# Patient Record
Sex: Male | Born: 1950 | Race: White | Hispanic: No | Smoking: Former smoker
Health system: Southern US, Community
[De-identification: ages and names within clinical notes are randomized; demographics above are authoritative.]

## PROBLEM LIST (undated history)

## (undated) DIAGNOSIS — I447 Left bundle-branch block, unspecified: Secondary | ICD-10-CM

## (undated) DIAGNOSIS — N402 Nodular prostate without lower urinary tract symptoms: Secondary | ICD-10-CM

## (undated) DIAGNOSIS — K759 Inflammatory liver disease, unspecified: Secondary | ICD-10-CM

## (undated) DIAGNOSIS — G4733 Obstructive sleep apnea (adult) (pediatric): Secondary | ICD-10-CM

## (undated) DIAGNOSIS — N400 Enlarged prostate without lower urinary tract symptoms: Secondary | ICD-10-CM

## (undated) DIAGNOSIS — I10 Essential (primary) hypertension: Secondary | ICD-10-CM

## (undated) DIAGNOSIS — M1990 Unspecified osteoarthritis, unspecified site: Secondary | ICD-10-CM

## (undated) DIAGNOSIS — K219 Gastro-esophageal reflux disease without esophagitis: Secondary | ICD-10-CM

## (undated) DIAGNOSIS — E119 Type 2 diabetes mellitus without complications: Secondary | ICD-10-CM

## (undated) DIAGNOSIS — E785 Hyperlipidemia, unspecified: Secondary | ICD-10-CM

## (undated) DIAGNOSIS — Z8719 Personal history of other diseases of the digestive system: Secondary | ICD-10-CM

## (undated) DIAGNOSIS — R972 Elevated prostate specific antigen [PSA]: Secondary | ICD-10-CM

## (undated) DIAGNOSIS — Z860100 Personal history of colon polyps, unspecified: Secondary | ICD-10-CM

## (undated) DIAGNOSIS — R399 Unspecified symptoms and signs involving the genitourinary system: Secondary | ICD-10-CM

## (undated) DIAGNOSIS — I2510 Atherosclerotic heart disease of native coronary artery without angina pectoris: Secondary | ICD-10-CM

## (undated) DIAGNOSIS — N529 Male erectile dysfunction, unspecified: Secondary | ICD-10-CM

## (undated) DIAGNOSIS — J42 Unspecified chronic bronchitis: Secondary | ICD-10-CM

## (undated) DIAGNOSIS — I739 Peripheral vascular disease, unspecified: Secondary | ICD-10-CM

## (undated) HISTORY — PX: EYE SURGERY: SHX253

## (undated) HISTORY — PX: CARDIAC CATHETERIZATION: SHX172

## (undated) HISTORY — PX: OTHER SURGICAL HISTORY: SHX169

## (undated) HISTORY — PX: COLONOSCOPY: SHX174

---

## 2000-08-02 ENCOUNTER — Ambulatory Visit (HOSPITAL_COMMUNITY): Admission: RE | Admit: 2000-08-02 | Discharge: 2000-08-02 | Payer: Self-pay | Admitting: Pulmonary Disease

## 2000-08-02 ENCOUNTER — Encounter: Payer: Self-pay | Admitting: Pulmonary Disease

## 2004-01-28 ENCOUNTER — Ambulatory Visit (HOSPITAL_BASED_OUTPATIENT_CLINIC_OR_DEPARTMENT_OTHER): Admission: RE | Admit: 2004-01-28 | Discharge: 2004-01-28 | Payer: Self-pay | Admitting: Pulmonary Disease

## 2004-11-09 ENCOUNTER — Ambulatory Visit: Payer: Self-pay | Admitting: Pulmonary Disease

## 2005-01-29 ENCOUNTER — Ambulatory Visit: Payer: Self-pay | Admitting: Pulmonary Disease

## 2005-08-13 ENCOUNTER — Ambulatory Visit: Payer: Self-pay | Admitting: Pulmonary Disease

## 2005-11-20 ENCOUNTER — Ambulatory Visit: Payer: Self-pay | Admitting: Pulmonary Disease

## 2005-11-23 ENCOUNTER — Ambulatory Visit: Payer: Self-pay | Admitting: Pulmonary Disease

## 2005-11-25 ENCOUNTER — Emergency Department (HOSPITAL_COMMUNITY): Admission: EM | Admit: 2005-11-25 | Discharge: 2005-11-25 | Payer: Self-pay | Admitting: Emergency Medicine

## 2005-11-26 ENCOUNTER — Encounter: Admission: RE | Admit: 2005-11-26 | Discharge: 2005-11-30 | Payer: Self-pay | Admitting: Pulmonary Disease

## 2005-12-14 ENCOUNTER — Ambulatory Visit: Payer: Self-pay | Admitting: Endocrinology

## 2006-01-18 ENCOUNTER — Ambulatory Visit: Payer: Self-pay | Admitting: Endocrinology

## 2006-03-18 ENCOUNTER — Ambulatory Visit: Payer: Self-pay | Admitting: Endocrinology

## 2006-03-18 LAB — CONVERTED CEMR LAB
ALT: 28 units/L (ref 0–40)
AST: 24 units/L (ref 0–37)
Albumin: 3.9 g/dL (ref 3.5–5.2)
Alkaline Phosphatase: 56 units/L (ref 39–117)
Bilirubin, Direct: 0.1 mg/dL (ref 0.0–0.3)
Creatinine,U: 69.7 mg/dL
Hgb A1c MFr Bld: 5.9 % (ref 4.6–6.0)
Microalb Creat Ratio: 5.7 mg/g (ref 0.0–30.0)
Microalb, Ur: 0.4 mg/dL (ref 0.0–1.9)
Total Bilirubin: 0.8 mg/dL (ref 0.3–1.2)
Total Protein: 6.7 g/dL (ref 6.0–8.3)

## 2006-03-22 ENCOUNTER — Ambulatory Visit: Payer: Self-pay | Admitting: Endocrinology

## 2006-05-24 ENCOUNTER — Ambulatory Visit: Payer: Self-pay | Admitting: Endocrinology

## 2006-05-24 LAB — CONVERTED CEMR LAB: Hgb A1c MFr Bld: 5.7 % (ref 4.6–6.0)

## 2006-06-07 ENCOUNTER — Ambulatory Visit: Payer: Self-pay | Admitting: Endocrinology

## 2006-08-27 ENCOUNTER — Ambulatory Visit: Payer: Self-pay | Admitting: Endocrinology

## 2006-08-27 LAB — CONVERTED CEMR LAB
Hgb A1c MFr Bld: 5.9 % (ref 4.6–6.0)
Vitamin B-12: 888 pg/mL (ref 211–911)

## 2006-09-06 ENCOUNTER — Ambulatory Visit: Payer: Self-pay | Admitting: Endocrinology

## 2006-12-02 ENCOUNTER — Ambulatory Visit: Payer: Self-pay | Admitting: Endocrinology

## 2007-01-24 ENCOUNTER — Encounter: Payer: Self-pay | Admitting: *Deleted

## 2007-01-24 DIAGNOSIS — M1990 Unspecified osteoarthritis, unspecified site: Secondary | ICD-10-CM | POA: Insufficient documentation

## 2007-01-24 DIAGNOSIS — F528 Other sexual dysfunction not due to a substance or known physiological condition: Secondary | ICD-10-CM

## 2007-03-10 ENCOUNTER — Ambulatory Visit: Payer: Self-pay | Admitting: Endocrinology

## 2007-03-10 DIAGNOSIS — E119 Type 2 diabetes mellitus without complications: Secondary | ICD-10-CM

## 2007-03-11 LAB — CONVERTED CEMR LAB: Hgb A1c MFr Bld: 6 % (ref 4.6–6.0)

## 2007-06-16 ENCOUNTER — Ambulatory Visit: Payer: Self-pay | Admitting: Endocrinology

## 2007-06-16 LAB — CONVERTED CEMR LAB: Hgb A1c MFr Bld: 6.3 % — ABNORMAL HIGH (ref 4.6–6.0)

## 2007-06-26 ENCOUNTER — Telehealth (INDEPENDENT_AMBULATORY_CARE_PROVIDER_SITE_OTHER): Payer: Self-pay | Admitting: *Deleted

## 2007-08-26 ENCOUNTER — Ambulatory Visit: Payer: Self-pay | Admitting: Endocrinology

## 2007-08-26 ENCOUNTER — Encounter (INDEPENDENT_AMBULATORY_CARE_PROVIDER_SITE_OTHER): Payer: Self-pay | Admitting: *Deleted

## 2007-08-26 DIAGNOSIS — E1159 Type 2 diabetes mellitus with other circulatory complications: Secondary | ICD-10-CM | POA: Insufficient documentation

## 2007-08-26 DIAGNOSIS — I10 Essential (primary) hypertension: Secondary | ICD-10-CM | POA: Insufficient documentation

## 2007-12-25 ENCOUNTER — Ambulatory Visit: Payer: Self-pay | Admitting: Endocrinology

## 2007-12-25 LAB — CONVERTED CEMR LAB: Hgb A1c MFr Bld: 6.7 % — ABNORMAL HIGH (ref 4.6–6.0)

## 2008-07-15 ENCOUNTER — Encounter (INDEPENDENT_AMBULATORY_CARE_PROVIDER_SITE_OTHER): Payer: Self-pay | Admitting: *Deleted

## 2009-01-21 ENCOUNTER — Ambulatory Visit: Payer: Self-pay | Admitting: Endocrinology

## 2009-01-25 LAB — CONVERTED CEMR LAB
BUN: 19 mg/dL (ref 6–23)
CO2: 30 meq/L (ref 19–32)
Calcium: 9 mg/dL (ref 8.4–10.5)
Chloride: 98 meq/L (ref 96–112)
Cholesterol: 160 mg/dL (ref 0–200)
Creatinine, Ser: 0.9 mg/dL (ref 0.4–1.5)
Creatinine,U: 59.2 mg/dL
Direct LDL: 93.2 mg/dL
GFR calc non Af Amer: 91.27 mL/min (ref >60)
Glucose, Bld: 217 mg/dL — ABNORMAL HIGH (ref 70–99)
HDL: 32.6 mg/dL — ABNORMAL LOW (ref >39.00)
Hgb A1c MFr Bld: 7.5 % — ABNORMAL HIGH (ref 4.6–6.5)
Microalb Creat Ratio: 5.1 mg/g (ref 0.0–30.0)
Microalb, Ur: 0.3 mg/dL (ref 0.0–1.9)
Potassium: 4.1 meq/L (ref 3.5–5.1)
Sodium: 136 meq/L (ref 135–145)
Total CHOL/HDL Ratio: 5
Triglycerides: 243 mg/dL — ABNORMAL HIGH (ref 0.0–149.0)
VLDL: 48.6 mg/dL — ABNORMAL HIGH (ref 0.0–40.0)

## 2009-02-03 ENCOUNTER — Ambulatory Visit: Payer: Self-pay | Admitting: Endocrinology

## 2009-02-03 DIAGNOSIS — E7800 Pure hypercholesterolemia, unspecified: Secondary | ICD-10-CM

## 2009-05-05 ENCOUNTER — Ambulatory Visit: Payer: Self-pay | Admitting: Endocrinology

## 2009-05-05 LAB — CONVERTED CEMR LAB
ALT: 37 units/L (ref 0–53)
AST: 27 units/L (ref 0–37)
Albumin: 3.9 g/dL (ref 3.5–5.2)
Alkaline Phosphatase: 54 units/L (ref 39–117)
Bilirubin, Direct: 0.1 mg/dL (ref 0.0–0.3)
Cholesterol: 148 mg/dL (ref 0–200)
Direct LDL: 82.7 mg/dL
HDL: 36.5 mg/dL — ABNORMAL LOW (ref >39.00)
Hgb A1c MFr Bld: 7.5 % — ABNORMAL HIGH (ref 4.6–6.5)
Total Bilirubin: 0.9 mg/dL (ref 0.3–1.2)
Total CHOL/HDL Ratio: 4
Total Protein: 7 g/dL (ref 6.0–8.3)
Triglycerides: 222 mg/dL — ABNORMAL HIGH (ref 0.0–149.0)
VLDL: 44.4 mg/dL — ABNORMAL HIGH (ref 0.0–40.0)

## 2009-06-14 ENCOUNTER — Telehealth: Payer: Self-pay | Admitting: Internal Medicine

## 2009-08-11 ENCOUNTER — Ambulatory Visit: Payer: Self-pay | Admitting: Endocrinology

## 2009-08-11 LAB — CONVERTED CEMR LAB: Hgb A1c MFr Bld: 7.6 % — ABNORMAL HIGH (ref 4.6–6.5)

## 2009-11-10 ENCOUNTER — Ambulatory Visit: Payer: Self-pay | Admitting: Endocrinology

## 2009-11-10 LAB — CONVERTED CEMR LAB: Hgb A1c MFr Bld: 6.6 % — ABNORMAL HIGH (ref 4.6–6.5)

## 2009-11-19 ENCOUNTER — Inpatient Hospital Stay (HOSPITAL_COMMUNITY): Admission: EM | Admit: 2009-11-19 | Discharge: 2009-11-22 | Payer: Self-pay | Admitting: Emergency Medicine

## 2009-11-20 ENCOUNTER — Encounter (INDEPENDENT_AMBULATORY_CARE_PROVIDER_SITE_OTHER): Payer: Self-pay | Admitting: Internal Medicine

## 2009-11-20 HISTORY — PX: TRANSTHORACIC ECHOCARDIOGRAM: SHX275

## 2009-11-21 ENCOUNTER — Telehealth: Payer: Self-pay | Admitting: Pulmonary Disease

## 2009-11-28 ENCOUNTER — Encounter: Payer: Self-pay | Admitting: Endocrinology

## 2009-12-14 ENCOUNTER — Encounter: Payer: Self-pay | Admitting: Endocrinology

## 2010-02-23 ENCOUNTER — Ambulatory Visit: Payer: Self-pay | Admitting: Endocrinology

## 2010-05-25 ENCOUNTER — Ambulatory Visit: Admit: 2010-05-25 | Payer: Self-pay | Admitting: Endocrinology

## 2010-06-01 NOTE — Progress Notes (Signed)
Summary: Schedule Colonoscopy  Phone Note Outgoing Call Call back at Home Phone [phone]   Call placed by: Harlow Mares CMA Duncan Dull),  June 14, 2009 9:22 AM Call placed to: Patient Summary of Call: spoke to patients wife she will let patient know that he is over due for his colonoscopy and have him call back to schedule his colonoscopy.  Initial call taken by: Harlow Mares CMA (AAMA),  June 14, 2009 9:23 AM

## 2010-06-01 NOTE — Assessment & Plan Note (Signed)
Summary: 6 MTH FU  #--STC-PER PT RS TO 3 MTH FU STC   Vital Signs:  Patient profile:   60 year old male Height:      70 inches (177.80 cm) Weight:      210.50 pounds (95.68 kg) O2 Sat:      96 % on Room air Temp:     97.4 degrees F (36.33 degrees C) oral Pulse rate:   81 / minute BP sitting:   120 / 58  (left arm) Cuff size:   large  Vitals Entered By: Josph Macho CMA (May 05, 2009 2:28 PM)  O2 Flow:  Room air CC: 6 month follow up/ CF   Primary Provider:  Dr. Kriste Basque  CC:  6 month follow up/ CF.  History of Present Illness: no cbg record, but states cbg's are well-controlled. he takes 2 lipid meds as rx'ed.  Current Medications (verified): 1)  Adult Aspirin Low Strength 81 Mg  Tbdp (Aspirin) .... Take 1 By Mouth Qd 2)  Actoplus Met 15-850 Mg  Tabs (Pioglitazone Hcl-Metformin Hcl) .... Bid 3)  Lisinopril-Hydrochlorothiazide 10-12.5 Mg Tabs (Lisinopril-Hydrochlorothiazide) .Marland Kitchen.. 1 Qd 4)  Pravastatin Sodium 20 Mg Tabs (Pravastatin Sodium) .Marland Kitchen.. 1 Qhs 5)  Fenofibrate 54 Mg Tabs (Fenofibrate) .Marland Kitchen.. 1 Qd  Allergies (verified): 1)  ! Codeine 2)  ! * Latex  Past History:  Past Medical History: ENCOUNTER FOR LONG-TERM USE OF OTHER MEDICATIONS (ICD-V58.69) HYPERCHOLESTEROLEMIA (ICD-272.0) HYPERTENSION (ICD-401.9) AODM (ICD-250.00) ERECTILE DYSFUNCTION (ICD-302.72) OSTEOARTHRITIS (ICD-715.90)  Review of Systems  The patient denies weight loss and weight gain.         he has a slight cough, but says it does not bother him  Physical Exam  General:  obese.  no distress  Neck:  Supple without thyroid enlargement or tenderness. No cervical lymphadenopathy Lungs:  Clear to auscultation bilaterally. Normal respiratory effort.  Additional Exam:  Hemoglobin A1C       [H]  7.5 %   Cholesterol               148 mg/dL                   1-610 Triglycerides        [H]  222.0 mg/dL                 9.6-045.4 HDL                  [L]  09.81 mg/dL Cholesterol LDL      19.1  mg/dL   Impression & Recommendations:  Problem # 1:  AODM (ICD-250.00) needs increased rx  Problem # 2:  HYPERCHOLESTEROLEMIA (ICD-272.0) Assessment: Improved  Problem # 3:  HYPERTENSION (ICD-401.9) the acei may be causing cough, but pt likes the fact that it is inexpensive.  Other Orders: TLB-A1C / Hgb A1C (Glycohemoglobin) (83036-A1C) TLB-Lipid Panel (80061-LIPID) TLB-Hepatic/Liver Function Pnl (80076-HEPATIC) Est. Patient Level III (47829)  Patient Instructions: 1)  tests are being ordered for you today.  a few days after the test(s), please call [phone] to hear your test results. 2)  pending the test results, please continue the same medications for now. 3)  Please schedule a follow-up appointment in 3 months. 4)  (update: i left message on phone-tree:  we could add [place] or onglyza, but these are expensive.)

## 2010-06-01 NOTE — Progress Notes (Signed)
Summary: FYI  Phone Note Call from Patient Call back at Work Phone [phone]   Caller: Spouse//barbara Call For: Jameah Rouser Reason for Call: Talk to Nurse Summary of Call: FYI: Pt in [place] rm 2041 since 7/23, re: heart attack. Initial call taken by: Darletta Moll,  November 21, 2009 3:12 PM  Follow-up for Phone Call        SN is aware of message. Randell Loop CMA  November 28, 2009 11:47 AM      Appended Document: FYI per SN---last seen SN 10/2005----pt seen by Dr. Aletta Edouard forward this to Dr. Everardo All as Lorain Childes  Appended Document: FYI noted, thank you

## 2010-06-01 NOTE — Assessment & Plan Note (Signed)
Summary: 3 MTH FU---STC   Vital Signs:  Patient profile:   60 year old male Height:      70 inches (177.80 cm) Weight:      203.25 pounds (92.39 kg) BMI:     29.27 O2 Sat:      94 % on Room air Temp:     98.3 degrees F (36.83 degrees C) oral Pulse rate:   71 / minute BP sitting:   120 / 72  (left arm) Cuff size:   regular  Vitals Entered By: Brenton Grills MA (February 23, 2010 4:01 PM)  O2 Flow:  Room air CC: 3 month F/U/pt is taking Januvia in place of Onglyza/aj Is Patient Diabetic? Yes   Primary Provider:  Dr. Kriste Basque  CC:  3 month F/U/pt is taking Januvia in place of Onglyza/aj.  History of Present Illness: pt states he feels well in general.  no cbg record, but states cbg's are well-controlled  Current Medications (verified): 1)  Adult Aspirin Low Strength 81 Mg  Tbdp (Aspirin) .... Take 1 By Mouth Qd 2)  Actoplus Met 15-850 Mg  Tabs (Pioglitazone Hcl-Metformin Hcl) .... Bid 3)  Lisinopril-Hydrochlorothiazide 10-12.5 Mg Tabs (Lisinopril-Hydrochlorothiazide) .Marland Kitchen.. 1 Qd 4)  Pravastatin Sodium 40 Mg Tabs (Pravastatin Sodium) .Marland Kitchen.. 1 By Mouth At Bedtime 5)  Fenofibrate 54 Mg Tabs (Fenofibrate) .Marland Kitchen.. 1 Qd 6)  Onglyza 5 Mg Tabs (Saxagliptin Hcl) .Marland Kitchen.. 1 Qam 7)  Omeprazole 20 Mg Tbec (Omeprazole) .Marland Kitchen.. 1 Tablet By Mouth Once Daily 8)  Multivitamins  Tabs (Multiple Vitamin) .Marland Kitchen.. 1 By Mouth Once Daily 9)  Metoprolol Succinate 25 Mg Xr24h-Tab (Metoprolol Succinate) .Marland Kitchen.. 1 By Mouth Once Daily 10)  Isosorbide Mononitrate Cr 30 Mg Xr24h-Tab (Isosorbide Mononitrate) .... 1/2 Daily 11)  Januvia 100 Mg Tabs (Sitagliptin Phosphate) .Marland Kitchen.. 1 By Mouth Once Daily  Allergies (verified): 1)  ! Codeine 2)  ! * Latex  Past History:  Past Medical History: Last updated: 05/05/2009 ENCOUNTER FOR LONG-TERM USE OF OTHER MEDICATIONS (ICD-V58.69) HYPERCHOLESTEROLEMIA (ICD-272.0) HYPERTENSION (ICD-401.9) AODM (ICD-250.00) ERECTILE DYSFUNCTION (ICD-302.72) OSTEOARTHRITIS (ICD-715.90)  Review of  Systems  The patient denies hypoglycemia.    Physical Exam  General:  obese.  no distress  Pulses:  dorsalis pedis intact bilat. Extremities:  no deformity.  no ulcer on the feet.  feet are of normal color and temp.  no edema  Neurologic:  sensation is intact to touch on the feet.  Additional Exam:   Hemoglobin A1C       [H]  7.1 %    Impression & Recommendations:  Problem # 1:  AODM (ICD-250.00) therapy limited by lack of health-insurance  Medications Added to Medication List This Visit: 1)  Pravastatin Sodium 40 Mg Tabs (Pravastatin sodium) .Marland Kitchen.. 1 by mouth at bedtime 2)  Omeprazole 20 Mg Tbec (Omeprazole) .Marland Kitchen.. 1 tablet by mouth once daily 3)  Multivitamins Tabs (Multiple vitamin) .Marland Kitchen.. 1 by mouth once daily 4)  Metoprolol Succinate 25 Mg Xr24h-tab (Metoprolol succinate) .Marland Kitchen.. 1 by mouth once daily 5)  Isosorbide Mononitrate Cr 30 Mg Xr24h-tab (Isosorbide mononitrate) .... 1/2 daily 6)  Januvia 100 Mg Tabs (Sitagliptin phosphate) .Marland Kitchen.. 1 by mouth once daily  Other Orders: Admin 1st Vaccine (87564) Flu Vaccine 5yrs + (33295) TLB-A1C / Hgb A1C (Glycohemoglobin) (83036-A1C) Est. Patient Level II (18841)  Patient Instructions: 1)  blood tests are being ordered for you today.  please call [phone] to hear your test results. 2)  pending the test results, please continue the same medications for now 3)  here are various samples of your diabetes meds.  the goal is to add up to onglyza 5 mg, metformin 2000 mg, and actos 45 mg, per day. 4)  (januvia 100 mg is like onglyza 5 mg). 5)  Please schedule a follow-up appointment in 3 months. Prescriptions: JANUVIA 100 MG TABS (SITAGLIPTIN PHOSPHATE) 1 by mouth once daily  #30 x 11   Entered and Authorized by:   Minus Breeding MD   Signed by:   Minus Breeding MD on 02/23/2010   Method used:   Print then Give to Patient   RxID:   [id number]    Orders Added: 1)  Admin 1st Vaccine [90471] 2)  Flu Vaccine 34yrs + [14782] 3)  TLB-A1C  / Hgb A1C (Glycohemoglobin) [83036-A1C] 4)  Est. Patient Level II [95621]     Preventive Care Screening  Last Pneumovax:    Date:  10/28/2009    Results:  given         Flu Vaccine Consent Questions     Do you have a history of severe allergic reactions to this vaccine? no    Any prior history of allergic reactions to egg and/or gelatin? no    Do you have a sensitivity to the preservative Thimersol? no    Do you have a past history of Guillan-Barre Syndrome? no    Do you currently have an acute febrile illness? no    Have you ever had a severe reaction to latex? no    Vaccine information given and explained to patient? yes    Are you currently pregnant? no    Lot Number:[id number]   Exp Date:10/28/2010   Site Given  Right Deltoid [id number]

## 2010-06-01 NOTE — Assessment & Plan Note (Signed)
Summary: 3 mos f/u / # / cd   Vital Signs:  Patient profile:   60 year old male Height:      70 inches (177.80 cm) Weight:      194 pounds (88.18 kg) BMI:     27.94 O2 Sat:      94 % on Room air Temp:     97.5 degrees F (36.39 degrees C) oral Pulse rate:   68 / minute BP sitting:   136 / 80  (left arm) Cuff size:   regular  Vitals Entered By: Brenton Grills MA (November 10, 2009 3:41 PM)  O2 Flow:  Room air CC: 3 mo F/U/aj    Primary Provider:  Dr. Kriste Basque  CC:  3 mo F/U/aj.  History of Present Illness: no cbg record, but states cbg's are well-controlled.   pt states he feels well in general.  he has lost weight, due to his efforts.  Current Medications (verified): 1)  Adult Aspirin Low Strength 81 Mg  Tbdp (Aspirin) .... Take 1 By Mouth Qd 2)  Actoplus Met 15-850 Mg  Tabs (Pioglitazone Hcl-Metformin Hcl) .... Bid 3)  Lisinopril-Hydrochlorothiazide 10-12.5 Mg Tabs (Lisinopril-Hydrochlorothiazide) .Marland Kitchen.. 1 Qd 4)  Pravastatin Sodium 20 Mg Tabs (Pravastatin Sodium) .Marland Kitchen.. 1 Qhs 5)  Fenofibrate 54 Mg Tabs (Fenofibrate) .Marland Kitchen.. 1 Qd 6)  Onglyza 5 Mg Tabs (Saxagliptin Hcl) .Marland Kitchen.. 1 Qam  Allergies (verified): 1)  ! Codeine 2)  ! 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    Physical Exam  General:  normal appearance.   Extremities:  no edema Additional Exam:  Hemoglobin A1C       [H]  6.6 %     Impression & Recommendations:  Problem # 1:  AODM (ICD-250.00) well-controlled  Other Orders: TLB-A1C / Hgb A1C (Glycohemoglobin) (83036-A1C) Est. Patient Level II (54098)  Patient  Instructions: 1)  tests are being ordered for you today.  a few days after the test(s), please call [phone] to hear your test results. 2)  pending the test results, please continue the same medications for now.   3)  here are some samples.  please note that onglyza 5 mg = januvia 100 mg = tradjenta 5 mg.   4)  Please schedule a follow-up appointment in 3 months. 5)  (update: i left message on phone-tree:  rx as we discussed)

## 2010-06-01 NOTE — Letter (Signed)
Summary: [facility]  [facility]   Imported By: Lennie Odor 12/09/2009 14:14:26  _____________________________________________________________________  External Attachment:    Type:   Image     Comment:   External Document

## 2010-06-01 NOTE — Assessment & Plan Note (Signed)
Summary: 3 MO ROV /NWS   Vital Signs:  Patient profile:   60 year old male Height:      70 inches (177.80 cm) Weight:      206.25 pounds (93.75 kg) O2 Sat:      95 % on Room air Temp:     97.2 degrees F (36.22 degrees C) oral Pulse rate:   81 / minute BP sitting:   132 / 68  (left arm) Cuff size:   large  Vitals Entered By: Josph Macho RMA (August 11, 2009 2:13 PM)  O2 Flow:  Room air CC: 3 month follow up/ CF   Primary Provider:  Dr. Kriste Basque  CC:  3 month follow up/ CF.  History of Present Illness: no cbg record, but states cbg's are high-100's.  pt states he feels well in general.  Current Medications (verified): 1)  Adult Aspirin Low Strength 81 Mg  Tbdp (Aspirin) .... Take 1 By Mouth Qd 2)  Actoplus Met 15-850 Mg  Tabs (Pioglitazone Hcl-Metformin Hcl) .... Bid 3)  Lisinopril-Hydrochlorothiazide 10-12.5 Mg Tabs (Lisinopril-Hydrochlorothiazide) .Marland Kitchen.. 1 Qd 4)  Pravastatin Sodium 20 Mg Tabs (Pravastatin Sodium) .Marland Kitchen.. 1 Qhs 5)  Fenofibrate 54 Mg Tabs (Fenofibrate) .Marland Kitchen.. 1 Qd  Allergies (verified): 1)  ! Codeine 2)  ! 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    Physical Exam  General:  normal appearance.   Pulses:  dorsalis pedis intact bilat. Extremities:  no deformity.  no ulcer on the feet.  feet are of normal color and temp.  no edema.  Neurologic:  sensation is intact to touch on the feet.  Additional Exam:  Hemoglobin A1C       [H]  7.6 %    Impression & Recommendations:  Problem # 1:  AODM (ICD-250.00) needs increased rx  Medications Added to Medication  List This Visit: 1)  Onglyza 5 Mg Tabs (Saxagliptin hcl) .Marland Kitchen.. 1 qam  Other Orders: TLB-A1C / Hgb A1C (Glycohemoglobin) (83036-A1C) Est. Patient Level II (78469)  Patient Instructions: 1)  tests are being ordered for you today.  a few days after the test(s), please call [phone] to hear your test results. 2)  pending the test results, please add onglyza 5 mg each am. 3)  Please schedule a follow-up appointment in 3 months. 4)  (update: i left message on phone-tree:  rx as we discussed) Prescriptions: ONGLYZA 5 MG TABS (SAXAGLIPTIN HCL) 1 qam  #30 x 11   Entered and Authorized by:   Minus Breeding MD   Signed by:   Minus Breeding MD on 08/11/2009   Method used:   Print then Give to Patient   RxID:   [id number] ONGLYZA 5 MG TABS (SAXAGLIPTIN HCL) 1 qam  #30 x 11   Entered and Authorized by:   Minus Breeding MD   Signed by:   Minus Breeding MD on 08/11/2009   Method used:   Electronically to        [facility]  [phone]* (retail)       [address]       [place], [place]  [address]  Ph: [phone] or [phone]       Fax: [phone]   RxID:   [id number]

## 2010-06-01 NOTE — Letter (Signed)
Summary: [facility]  [facility]   Imported By: Lester Pickering 12/20/2009 10:35:29  _____________________________________________________________________  External Attachment:    Type:   Image     Comment:   External Document

## 2010-06-09 ENCOUNTER — Other Ambulatory Visit: Payer: Self-pay | Admitting: Endocrinology

## 2010-06-09 ENCOUNTER — Ambulatory Visit (INDEPENDENT_AMBULATORY_CARE_PROVIDER_SITE_OTHER): Payer: Self-pay | Admitting: Endocrinology

## 2010-06-09 ENCOUNTER — Other Ambulatory Visit: Payer: Self-pay

## 2010-06-09 ENCOUNTER — Encounter (INDEPENDENT_AMBULATORY_CARE_PROVIDER_SITE_OTHER): Payer: Self-pay | Admitting: *Deleted

## 2010-06-09 ENCOUNTER — Encounter: Payer: Self-pay | Admitting: Endocrinology

## 2010-06-09 DIAGNOSIS — E7800 Pure hypercholesterolemia, unspecified: Secondary | ICD-10-CM

## 2010-06-09 DIAGNOSIS — E119 Type 2 diabetes mellitus without complications: Secondary | ICD-10-CM

## 2010-06-09 DIAGNOSIS — E785 Hyperlipidemia, unspecified: Secondary | ICD-10-CM

## 2010-06-09 DIAGNOSIS — Z79899 Other long term (current) drug therapy: Secondary | ICD-10-CM

## 2010-06-09 LAB — HEPATIC FUNCTION PANEL
Albumin: 4.4 g/dL (ref 3.5–5.2)
Total Protein: 7.4 g/dL (ref 6.0–8.3)

## 2010-06-09 LAB — LIPID PANEL
Cholesterol: 131 mg/dL (ref 0–200)
HDL: 37.5 mg/dL — ABNORMAL LOW (ref >39.00)
VLDL: 40.2 mg/dL — ABNORMAL HIGH (ref 0.0–40.0)

## 2010-06-09 LAB — BASIC METABOLIC PANEL
BUN: 17 mg/dL (ref 6–23)
Calcium: 9.5 mg/dL (ref 8.4–10.5)
GFR: 97.05 mL/min (ref >60.00)
Glucose, Bld: 127 mg/dL — ABNORMAL HIGH (ref 70–99)

## 2010-06-15 NOTE — Assessment & Plan Note (Signed)
Summary: PER WIFE/BARBARA RS FROM JAN--D/T--STC   Vital Signs:  Patient profile:   60 year old male Height:      70 inches (177.80 cm) Weight:      207.38 pounds (94.26 kg) BMI:     29.86 O2 Sat:      95 % on Room air Temp:     98.5 degrees F (36.94 degrees C) oral Pulse rate:   70 / minute Pulse rhythm:   regular BP sitting:   124 / 70  (left arm) Cuff size:   regular  Vitals Entered By: Brenton Grills CMA Duncan Dull) (June 09, 2010 11:28 AM)  O2 Flow:  Room air CC: 3 month F/U/pt c/o cough, sore throat x 2 days/aj Is Patient Diabetic? Yes   Primary Provider:  Dr. Kriste Basque  CC:  3 month F/U/pt c/o cough and sore throat x 2 days/aj.  History of Present Illness: pt states 2 days of moderate pain at the throat, and assoc nasal congestion. no cbg record, but states cbg's are well-controlled  Current Medications (verified): 1)  Adult Aspirin Low Strength 81 Mg  Tbdp (Aspirin) .... Take 1 By Mouth Qd 2)  Actoplus Met 15-850 Mg  Tabs (Pioglitazone Hcl-Metformin Hcl) .... Bid 3)  Lisinopril-Hydrochlorothiazide 10-12.5 Mg Tabs (Lisinopril-Hydrochlorothiazide) .Marland Kitchen.. 1 Qd 4)  Pravastatin Sodium 40 Mg Tabs (Pravastatin Sodium) .Marland Kitchen.. 1 By Mouth At Bedtime 5)  Fenofibrate 54 Mg Tabs (Fenofibrate) .Marland Kitchen.. 1 Qd 6)  Onglyza 5 Mg Tabs (Saxagliptin Hcl) .Marland Kitchen.. 1 Qam 7)  Omeprazole 20 Mg Tbec (Omeprazole) .Marland Kitchen.. 1 Tablet By Mouth Once Daily 8)  Multivitamins  Tabs (Multiple Vitamin) .Marland Kitchen.. 1 By Mouth Once Daily 9)  Metoprolol Succinate 25 Mg Xr24h-Tab (Metoprolol Succinate) .Marland Kitchen.. 1 By Mouth Once Daily 10)  Isosorbide Mononitrate Cr 30 Mg Xr24h-Tab (Isosorbide Mononitrate) .... 1/2 Daily  Allergies (verified): 1)  ! Codeine 2)  ! * Latex  Past History:  Past Medical History: Last updated: 05/05/2009 ENCOUNTER FOR LONG-TERM USE OF OTHER MEDICATIONS (ICD-V58.69) HYPERCHOLESTEROLEMIA (ICD-272.0) HYPERTENSION (ICD-401.9) AODM (ICD-250.00) ERECTILE DYSFUNCTION (ICD-302.72) OSTEOARTHRITIS  (ICD-715.90)  Review of Systems  The patient denies fever and dyspnea on exertion.         he has a dry cough.  Physical Exam  General:  obese.  no distress  Head:  head: no deformity eyes: no periorbital swelling, no proptosis external nose and ears are normal mouth: no lesion seen Ears:  both tm's are red (left > right) Lungs:  Clear to auscultation bilaterally. Normal respiratory effort.  Additional Exam:  Hemoglobin A1C       [H]  8.0 %   Cholesterol LDL        67.7 mg/dL   Impression & Recommendations:  Problem # 1:  uri new  Problem # 2:  AODM (ICD-250.00) needs increased rx  Problem # 3:  HYPERCHOLESTEROLEMIA (ICD-272.0) well-controlled  Medications Added to Medication List This Visit: 1)  Azithromycin 500 Mg Tabs (Azithromycin) .Marland Kitchen.. 1 tab once daily 2)  Glimepiride 1 Mg Tabs (Glimepiride) .... 1/2 tab each am  Other Orders: TLB-A1C / Hgb A1C (Glycohemoglobin) (83036-A1C) TLB-BMP (Basic Metabolic Panel-BMET) (80048-METABOL) TLB-Hepatic/Liver Function Pnl (80076-HEPATIC) TLB-Lipid Panel (80061-LIPID) Est. Patient Level III (16109)  Patient Instructions: 1)  blood tests are being ordered for you today.  please call [phone] to hear your test results. 2)  pending the test results, please continue the same medications for now 3)  here are various samples of your diabetes meds.  the goal is to add up  to onglyza 5 mg, metformin 2000 mg, and actos 45 mg, per day. 4)  (januvia 100 mg is like onglyza 5 mg). 5)  Please schedule a follow-up appointment in 3 months. 6)  take azithromycin 500 mg once daily. 7)  (update: i left message on phone-tree:  add amaryl 0.5 mg qam) Prescriptions: GLIMEPIRIDE 1 MG TABS (GLIMEPIRIDE) 1/2 tab each am  #30 x 5   Entered and Authorized by:   Minus Breeding MD   Signed by:   Minus Breeding MD on 06/10/2010   Method used:   Electronically to        [facility]  [phone]* (retail)       [address]        [place], [place]  [address]       Ph: [phone] or [phone]       Fax: [phone]   RxID:   [id number] AZITHROMYCIN 500 MG TABS (AZITHROMYCIN) 1 tab once daily  #6 x 0   Entered and Authorized by:   Minus Breeding MD   Signed by:   Minus Breeding MD on 06/09/2010   Method used:   Electronically to        [facility]  [phone]* (retail)       [address]       [place], [place]  [address]       Ph: [phone] or [phone]       Fax: [phone]   RxID:   [id number]    Orders Added: 1)  TLB-A1C / Hgb A1C (Glycohemoglobin) [83036-A1C] 2)  TLB-BMP (Basic Metabolic Panel-BMET) [80048-METABOL] 3)  TLB-Hepatic/Liver Function Pnl [80076-HEPATIC] 4)  TLB-Lipid Panel [80061-LIPID] 5)  Est. Patient Level III [46270]

## 2010-07-15 LAB — DIFFERENTIAL
Basophils Absolute: 0 10*3/uL (ref 0.0–0.1)
Basophils Relative: 0 % (ref 0–1)
Eosinophils Absolute: 0.1 10*3/uL (ref 0.0–0.7)
Eosinophils Relative: 1 % (ref 0–5)
Neutrophils Relative %: 68 % (ref 43–77)

## 2010-07-15 LAB — LIPID PANEL
Cholesterol: 130 mg/dL (ref 0–200)
HDL: 35 mg/dL — ABNORMAL LOW (ref >39)
Triglycerides: 171 mg/dL — ABNORMAL HIGH (ref <150)

## 2010-07-15 LAB — COMPREHENSIVE METABOLIC PANEL
ALT: 20 U/L (ref 0–53)
ALT: 25 U/L (ref 0–53)
AST: 21 U/L (ref 0–37)
AST: 27 U/L (ref 0–37)
Alkaline Phosphatase: 61 U/L (ref 39–117)
CO2: 22 mEq/L (ref 19–32)
CO2: 24 mEq/L (ref 19–32)
Chloride: 100 mEq/L (ref 96–112)
Chloride: 104 mEq/L (ref 96–112)
GFR calc Af Amer: 60 mL/min — ABNORMAL LOW (ref >60)
GFR calc Af Amer: >60 mL/min (ref >60)
GFR calc non Af Amer: 49 mL/min — ABNORMAL LOW (ref >60)
GFR calc non Af Amer: >60 mL/min (ref >60)
Glucose, Bld: 168 mg/dL — ABNORMAL HIGH (ref 70–99)
Sodium: 133 mEq/L — ABNORMAL LOW (ref 135–145)
Sodium: 138 mEq/L (ref 135–145)
Total Bilirubin: 0.6 mg/dL (ref 0.3–1.2)
Total Bilirubin: 0.6 mg/dL (ref 0.3–1.2)

## 2010-07-15 LAB — MRSA PCR SCREENING: MRSA by PCR: NEGATIVE

## 2010-07-15 LAB — TROPONIN I: Troponin I: 0.01 ng/mL (ref 0.00–0.06)

## 2010-07-15 LAB — CBC
HCT: 36.7 % — ABNORMAL LOW (ref 39.0–52.0)
HCT: 37.3 % — ABNORMAL LOW (ref 39.0–52.0)
HCT: 39 % (ref 39.0–52.0)
Hemoglobin: 12.3 g/dL — ABNORMAL LOW (ref 13.0–17.0)
Hemoglobin: 12.5 g/dL — ABNORMAL LOW (ref 13.0–17.0)
Hemoglobin: 13.1 g/dL (ref 13.0–17.0)
MCH: 30.2 pg (ref 26.0–34.0)
MCH: 30.5 pg (ref 26.0–34.0)
MCHC: 33.4 g/dL (ref 30.0–36.0)
MCV: 87.5 fL (ref 78.0–100.0)
Platelets: 291 10*3/uL (ref 150–400)
RBC: 4.15 MIL/uL — ABNORMAL LOW (ref 4.22–5.81)
RBC: 4.23 MIL/uL (ref 4.22–5.81)
RBC: 4.33 MIL/uL (ref 4.22–5.81)
RBC: 4.45 MIL/uL (ref 4.22–5.81)
WBC: 7.9 10*3/uL (ref 4.0–10.5)

## 2010-07-15 LAB — BASIC METABOLIC PANEL
CO2: 24 mEq/L (ref 19–32)
Calcium: 8.8 mg/dL (ref 8.4–10.5)
GFR calc Af Amer: >60 mL/min (ref >60)
GFR calc non Af Amer: >60 mL/min (ref >60)
GFR calc non Af Amer: >60 mL/min (ref >60)
Glucose, Bld: 122 mg/dL — ABNORMAL HIGH (ref 70–99)
Glucose, Bld: 135 mg/dL — ABNORMAL HIGH (ref 70–99)
Potassium: 4.4 mEq/L (ref 3.5–5.1)
Potassium: 4.4 mEq/L (ref 3.5–5.1)
Sodium: 136 mEq/L (ref 135–145)
Sodium: 141 mEq/L (ref 135–145)

## 2010-07-15 LAB — POCT I-STAT, CHEM 8
Calcium, Ion: 1.09 mmol/L — ABNORMAL LOW (ref 1.12–1.32)
Creatinine, Ser: 1.4 mg/dL (ref 0.4–1.5)
Glucose, Bld: 167 mg/dL — ABNORMAL HIGH (ref 70–99)
Hemoglobin: 14.3 g/dL (ref 13.0–17.0)
Potassium: 4 mEq/L (ref 3.5–5.1)
TCO2: 23 mmol/L (ref 0–100)

## 2010-07-15 LAB — GLUCOSE, CAPILLARY
Glucose-Capillary: 105 mg/dL — ABNORMAL HIGH (ref 70–99)
Glucose-Capillary: 112 mg/dL — ABNORMAL HIGH (ref 70–99)
Glucose-Capillary: 121 mg/dL — ABNORMAL HIGH (ref 70–99)
Glucose-Capillary: 137 mg/dL — ABNORMAL HIGH (ref 70–99)
Glucose-Capillary: 138 mg/dL — ABNORMAL HIGH (ref 70–99)
Glucose-Capillary: 144 mg/dL — ABNORMAL HIGH (ref 70–99)
Glucose-Capillary: 161 mg/dL — ABNORMAL HIGH (ref 70–99)
Glucose-Capillary: 170 mg/dL — ABNORMAL HIGH (ref 70–99)
Glucose-Capillary: 194 mg/dL — ABNORMAL HIGH (ref 70–99)

## 2010-07-15 LAB — POCT CARDIAC MARKERS: CKMB, poc: 1.2 ng/mL (ref 1.0–8.0)

## 2010-07-15 LAB — PROTIME-INR: Prothrombin Time: 13.3 seconds (ref 11.6–15.2)

## 2010-07-15 LAB — BRAIN NATRIURETIC PEPTIDE: Pro B Natriuretic peptide (BNP): <30 pg/mL (ref 0.0–100.0)

## 2010-07-15 LAB — CARDIAC PANEL(CRET KIN+CKTOT+MB+TROPI): Total CK: 98 U/L (ref 7–232)

## 2010-07-15 LAB — CK TOTAL AND CKMB (NOT AT ARMC)
CK, MB: 1.3 ng/mL (ref 0.3–4.0)
Relative Index: 1.2 (ref 0.0–2.5)

## 2010-08-24 ENCOUNTER — Other Ambulatory Visit: Payer: Self-pay | Admitting: Endocrinology

## 2010-09-07 ENCOUNTER — Ambulatory Visit: Payer: Self-pay | Admitting: Endocrinology

## 2010-09-07 DIAGNOSIS — Z0289 Encounter for other administrative examinations: Secondary | ICD-10-CM

## 2010-09-15 NOTE — Procedures (Signed)
Todd Krause, [name]   MEDICAL RECORD NO.:  [id number]          PATIENT TYPE:  OUT   LOCATION:  SLEEP CENTER                  FACILITY:  [facility]   PHYSICIAN:  Marcelyn Bruins, M.D. [facility] DATE OF BIRTH:  Dec 26, 1950   DATE OF STUDY:  01/28/2004                              NOCTURNAL POLYSOMNOGRAM   REFERRING PHYSICIAN:  Dr. Lonzo Cloud. Nadel.   INDICATION FOR SURGERY:  Hypersomnia with sleep apnea.   EPWORTH SLEEPINESS SCORE:  Epworth score is 17.   SLEEP ARCHITECTURE:  The patient had a total sleep time of 393 minutes with  a sleep efficiency of 84%.  There was decreased REM and very little slow-  wave sleep.  Sleep onset latency was normal, as was REM latency.   IMPRESSION:  1.  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  The patient had a respiratory disturbance      index of 14 events per hour.  The events occurred very commonly in rapid      eye movement, however, they were not entirely positional.  2.  Mild-to-moderate snoring noted throughout the study.  3.  Occasional premature ventricular contraction.      KC/MEDQ  D:  02/18/2004 17:27:40  T:  02/19/2004 09:14:17  Job:  [id number]

## 2010-09-15 NOTE — Consult Note (Signed)
[facility]                            ENDOCRINOLOGY CONSULTATION   NAME:Todd Krause, Todd Krause              MRN:          [id number]  DATE:12/14/2005                            DOB:          01-17-1951    REFERRING PHYSICIAN:  Lonzo Cloud. Kriste Basque, MD   REASON FOR REFERRAL:  Diabetes.   HISTORY OF PRESENT ILLNESS:  This is a 60 year old man diagnosed with  diabetes about a month ago.  He is not known to have any chronic  complications.  He was treated with Actos as well as Glucovance, and his  glucoses have improved from the 300s down to the low 100s.  Symptomatically,  he has 3 weeks of 24-pound weight loss with some associated blurry vision  and some numbness of the feet.  Each of these symptoms has improved over the  past few days.   PAST MEDICAL HISTORY:  1. Osteoarthritis.  2. ED.   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   FAMILY HISTORY:  Positive for diabetes in both parents.   REVIEW OF SYSTEMS:  Denies the following:  Fever, chest pain, nausea,  vomiting, or shortness of breath.   PHYSICAL EXAMINATION:  VITAL SIGNS:  Blood pressure 114/71.  Heart rate 57.  Temperature 97.5.  Weight 192.  GENERAL:  No distress.  SKIN:  Not diaphoretic.  No rash.  HEENT:  No ptosis.  No periorbital swelling.  Pharynx: No erythema.  NECK:  No goiter.  CHEST:  Clear to auscultation.  CARDIOVASCULAR:  No JVD.  No edema.  Regular rate and rhythm.  No murmur.  Pedal pulses are intact.  FEET:  Normal color and temperature.  I do not see any ulcer on the feet.  NEUROLOGIC:  Alert, well-orientation.  Sensation is intact to touch in the  feet, but slightly decreased from normal.   LABORATORY STUDIES:  On November 23, 2005, hemoglobin A1c 11.4.  GOT normal at  32.  GPT elevated at 53.   IMPRESSION:  1. Type 2 diabetes with significant improvement in his glycemic control      with medications prescribed by Dr. Kriste Basque.  2.  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  3. Slight numbness of the feet, probably due to his diabetes, improved.   PLAN:  1. We discussed the risk of diabetes and its natural history.  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  I also told      him that he has had so much improvement, that hypoglycemia could be a      problem.  Therefore, I have advised him to change his medications to      the following:  Actos plus Met 15/850, 2 every morning, 1 every      afternoon.  He will also take Januvia 100 mg a day.  2. He is advised to see an ophthalmologist.  3. Return in about three weeks.  4. When he  next has laboratory studies, will check his vitamin B12 and      recheck his hepatic transaminases.                                   Sean A. Everardo All, MD   SAE/MedQ  DD:  12/16/2005  DT:  12/16/2005  Job #:  [id number]   cc:   Lonzo Cloud. Kriste Basque, MD

## 2010-10-26 ENCOUNTER — Encounter: Payer: Self-pay | Admitting: *Deleted

## 2010-10-26 NOTE — Telephone Encounter (Signed)
A user error has taken place: encounter opened in error, closed for administrative reasons.

## 2011-01-31 ENCOUNTER — Other Ambulatory Visit: Payer: Self-pay | Admitting: Endocrinology

## 2011-03-02 ENCOUNTER — Other Ambulatory Visit: Payer: Self-pay | Admitting: Endocrinology

## 2011-03-08 ENCOUNTER — Other Ambulatory Visit: Payer: Self-pay | Admitting: Endocrinology

## 2011-03-13 ENCOUNTER — Ambulatory Visit (INDEPENDENT_AMBULATORY_CARE_PROVIDER_SITE_OTHER): Payer: Self-pay | Admitting: Endocrinology

## 2011-03-13 ENCOUNTER — Encounter: Payer: Self-pay | Admitting: Endocrinology

## 2011-03-13 DIAGNOSIS — E119 Type 2 diabetes mellitus without complications: Secondary | ICD-10-CM

## 2011-03-13 MED ORDER — GLIMEPIRIDE 4 MG PO TABS: 4.0000 mg | ORAL_TABLET | Freq: Two times a day (BID) | ORAL | Status: DC

## 2011-03-13 MED ORDER — METFORMIN HCL ER 500 MG PO TB24: ORAL_TABLET | ORAL | Status: DC

## 2011-03-13 MED ORDER — METOPROLOL SUCCINATE ER 25 MG PO TB24: 25.0000 mg | ORAL_TABLET | Freq: Every day | ORAL | Status: DC

## 2011-03-13 NOTE — Progress Notes (Signed)
  Subjective:    Patient ID: Todd Krause, male    DOB: 07-24-1950, 60 y.o.   MRN: [id number]  HPI Pt says cbg was well-controlled, until he ran out of actos + met.  he says it is too expensive.  no cbg record, but states cbg's are in the 200's.  pt states he feels well in general. He also ran out of bp meds.  Denies chest pain and sob. Past Medical History  Diagnosis Date  . HYPERCHOLESTEROLEMIA 02/03/2009  . 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 HYPERTENSION 08/26/2007  . OSTEOARTHRITIS 01/24/2007  . ERECTILE DYSFUNCTION 01/24/2007    No past surgical history on file.  History   Social History  . Marital Status: Married    Spouse Name: N/A    Number of Children: N/A  . Years of Education: N/A   Occupational History  . [employer] (cattle)    Social History Main Topics  . Smoking status: Former Games developer  . Smokeless tobacco: Not on file  . Alcohol Use: Not on file  . Drug Use: Not on file  . Sexually Active: Not on file   Other Topics Concern  . Not on file   Social History Narrative   No health insurance    Current Outpatient Prescriptions on File Prior to Visit  Medication Sig Dispense Refill  . aspirin 81 MG tablet Take 81 mg by mouth daily.        . fenofibrate 54 MG tablet TAKE ONE TABLET BY MOUTH EVERY DAY  30 tablet  4  . isosorbide mononitrate (IMDUR) 30 MG 24 hr tablet 1/2 tablet daily       . metoprolol succinate (TOPROL-XL) 25 MG 24 hr tablet Take 25 mg by mouth daily.        . Multiple Vitamin (MULTIVITAMIN) tablet Take 1 tablet by mouth daily.        Marland Kitchen omeprazole (PRILOSEC) 20 MG capsule Take 20 mg by mouth daily.        . pravastatin (PRAVACHOL) 40 MG tablet Take 40 mg by mouth at bedtime.        . saxagliptin HCl (ONGLYZA) 5 MG TABS tablet Take by mouth daily.        Marland Kitchen lisinopril-hydrochlorothiazide (PRINZIDE,ZESTORETIC) 10-12.5 MG per tablet TAKE ONE TABLET BY MOUTH EVERY DAY  30 tablet  5    Allergies  Allergen Reactions  . Codeine   . Latex     No family  history on file.  BP 152/82  Pulse 76  Temp(Src) 98.6 F (37 C) (Oral)  Ht 5\' 10"  (1.778 m)  Wt 209 lb (94.802 kg)  BMI 29.99 kg/m2  SpO2 93%  Review of Systems denies hypoglycemia    Objective:   Physical Exam VITAL SIGNS:  See vs page GENERAL: no distress Pulses: dorsalis pedis intact bilat.   Feet: no deformity.  no ulcer on the feet.  feet are of normal color and temp.  no edema Neuro: sensation is intact to touch on the feet     Assessment & Plan:  Htn, therapy limited by noncompliance.  i'll do the best i can. DM, therapy limited by noncompliance.  i'll do the best i can.

## 2011-03-13 NOTE — Patient Instructions (Addendum)
Stop actos, as the generic is still epensive. i have sent a prescription to your pharmacy, for the metformin. Increase glimepiride to 4 mg, 2x a day. check your blood sugar 1 time a day.  vary the time of day when you check, between before the 3 meals, and at bedtime.  also check if you have symptoms of your blood sugar being too high or too low.  please keep a record of the readings and bring it to your next appointment here.  please call us sooner if your blood sugar goes below 70, or if it stays over 200. I realize you do not have health insurance, but there are many other services you need for your health.  These include screening tests for colon, prostate and/or breast cancer; electrocardiogram, and others.  Please let me know if you want to get these important tests done.   Please come back for a follow-up appointment in 3 months.

## 2011-06-07 ENCOUNTER — Other Ambulatory Visit (INDEPENDENT_AMBULATORY_CARE_PROVIDER_SITE_OTHER): Payer: Self-pay

## 2011-06-07 ENCOUNTER — Encounter: Payer: Self-pay | Admitting: Endocrinology

## 2011-06-07 ENCOUNTER — Ambulatory Visit (INDEPENDENT_AMBULATORY_CARE_PROVIDER_SITE_OTHER): Payer: Self-pay | Admitting: Endocrinology

## 2011-06-07 VITALS — BP 110/68 | HR 79 | Temp 98.0°F | Ht 70.0 in | Wt 214.8 lb

## 2011-06-07 DIAGNOSIS — E119 Type 2 diabetes mellitus without complications: Secondary | ICD-10-CM

## 2011-06-07 LAB — HEMOGLOBIN A1C: Hgb A1c MFr Bld: 8.6 % — ABNORMAL HIGH (ref 4.6–6.5)

## 2011-06-07 NOTE — Progress Notes (Signed)
  Subjective:    Patient ID: Todd Krause, male    DOB: 11-09-1950, 61 y.o.   MRN: [id number]  HPI Pt returns for f/u of type 2 DM (2007).  pt states he feels well in general.  Despite being off the actos, cbg's are well-controlled Past Medical History  Diagnosis Date  . HYPERCHOLESTEROLEMIA 02/03/2009  . 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 HYPERTENSION 08/26/2007  . OSTEOARTHRITIS 01/24/2007  . ERECTILE DYSFUNCTION 01/24/2007    No past surgical history on file.  History   Social History  . Marital Status: Married    Spouse Name: N/A    Number of Children: N/A  . Years of Education: N/A   Occupational History  . [employer] (cattle)    Social History Main Topics  . Smoking status: Former Games developer  . Smokeless tobacco: Not on file  . Alcohol Use: Not on file  . Drug Use: Not on file  . Sexually Active: Not on file   Other Topics Concern  . Not on file   Social History Narrative   No health insurance    Current Outpatient Prescriptions on File Prior to Visit  Medication Sig Dispense Refill  . aspirin 81 MG tablet Take 81 mg by mouth daily.        . fenofibrate 54 MG tablet TAKE ONE TABLET BY MOUTH EVERY DAY  30 tablet  4  . glimepiride (AMARYL) 4 MG tablet Take 1 tablet (4 mg total) by mouth 2 (two) times daily.  60 tablet  11  . isosorbide mononitrate (IMDUR) 30 MG 24 hr tablet 1/2 tablet daily       . lisinopril-hydrochlorothiazide (PRINZIDE,ZESTORETIC) 10-12.5 MG per tablet TAKE ONE TABLET BY MOUTH EVERY DAY  30 tablet  5  . metFORMIN (GLUCOPHAGE-XR) 500 MG 24 hr tablet 4 tabs daily  120 tablet  11  . metoprolol succinate (TOPROL-XL) 25 MG 24 hr tablet Take 1 tablet (25 mg total) by mouth daily.  30 tablet  11  . Multiple Vitamin (MULTIVITAMIN) tablet Take 1 tablet by mouth daily.        Marland Kitchen omeprazole (PRILOSEC) 20 MG capsule Take 20 mg by mouth daily.        . pravastatin (PRAVACHOL) 40 MG tablet Take 40 mg by mouth at bedtime.        . saxagliptin HCl (ONGLYZA) 5 MG TABS tablet  Take by mouth daily.          Allergies  Allergen Reactions  . Codeine   . Latex     No family history on file.  BP 110/68  Pulse 79  Temp(Src) 98 F (36.7 C) (Oral)  Ht 5\' 10"  (1.778 m)  Wt 214 lb 12.8 oz (97.433 kg)  BMI 30.82 kg/m2  SpO2 94%   Review of Systems denies hypoglycemia    Objective:   Physical Exam VITAL SIGNS:  See vs page GENERAL: no distress Pulses: dorsalis pedis intact bilat.   Feet: no deformity.  no ulcer on the feet.  feet are of normal color and temp.  no edema Neuro: sensation is intact to touch on the feet   Lab Results  Component Value Date   HGBA1C 8.6* 06/07/2011      Assessment & Plan:  Type 2 DM, needs increased rx

## 2011-06-07 NOTE — Patient Instructions (Addendum)
blood tests are being requested for you today.  please call [phone] to hear your test results.  You will be prompted to enter the 9-digit "MRN" number that appears at the top left of this page, followed by #.  Then you will hear the message. check your blood sugar 1 time a day.  vary the time of day when you check, between before the 3 meals, and at bedtime.  also check if you have symptoms of your blood sugar being too high or too low.  please keep a record of the readings and bring it to your next appointment here.  please call us sooner if your blood sugar goes below 70, or if it stays over 200. Please come back for a follow-up appointment in 3 months.   (update: i left message on phone-tree:  acots will get cheaper soon.  Best oral option now is to add parlodel.  Cheapest overall is to change to insulin)

## 2011-08-30 ENCOUNTER — Other Ambulatory Visit: Payer: Self-pay | Admitting: Endocrinology

## 2011-09-06 ENCOUNTER — Other Ambulatory Visit (INDEPENDENT_AMBULATORY_CARE_PROVIDER_SITE_OTHER): Payer: Self-pay

## 2011-09-06 ENCOUNTER — Ambulatory Visit (INDEPENDENT_AMBULATORY_CARE_PROVIDER_SITE_OTHER): Payer: Self-pay | Admitting: Endocrinology

## 2011-09-06 ENCOUNTER — Encounter: Payer: Self-pay | Admitting: Endocrinology

## 2011-09-06 VITALS — BP 122/80 | HR 66 | Temp 98.1°F | Ht 70.0 in | Wt 210.0 lb

## 2011-09-06 DIAGNOSIS — E119 Type 2 diabetes mellitus without complications: Secondary | ICD-10-CM

## 2011-09-06 LAB — HEMOGLOBIN A1C: Hgb A1c MFr Bld: 9.9 % — ABNORMAL HIGH (ref 4.6–6.5)

## 2011-09-06 NOTE — Progress Notes (Signed)
  Subjective:    Patient ID: Todd Krause, male    DOB: 1950-08-23, 61 y.o.   MRN: [id number]  HPI Pt returns for f/u of type 2 DM (dx'ed 2007; no known complications).  pt states he feels well in general. He had to stop actos, due to cost, but it has since gotten cheaper.  no cbg record, but he states cbg's are well-controlled.   Past Medical History  Diagnosis Date  . HYPERCHOLESTEROLEMIA 02/03/2009  . 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 HYPERTENSION 08/26/2007  . OSTEOARTHRITIS 01/24/2007  . ERECTILE DYSFUNCTION 01/24/2007    No past surgical history on file.  History   Social History  . Marital Status: Married    Spouse Name: N/A    Number of Children: N/A  . Years of Education: N/A   Occupational History  . [employer] (cattle)    Social History Main Topics  . Smoking status: Former Games developer  . Smokeless tobacco: Not on file  . Alcohol Use: Not on file  . Drug Use: Not on file  . Sexually Active: Not on file   Other Topics Concern  . Not on file   Social History Narrative   No health insurance    Current Outpatient Prescriptions on File Prior to Visit  Medication Sig Dispense Refill  . aspirin 81 MG tablet Take 81 mg by mouth daily.        . fenofibrate 54 MG tablet TAKE ONE TABLET BY MOUTH EVERY DAY.  30 tablet  5  . glimepiride (AMARYL) 4 MG tablet Take 1 tablet (4 mg total) by mouth 2 (two) times daily.  60 tablet  11  . isosorbide mononitrate (IMDUR) 30 MG 24 hr tablet 1/2 tablet daily       . lisinopril-hydrochlorothiazide (PRINZIDE,ZESTORETIC) 10-12.5 MG per tablet TAKE ONE TABLET BY MOUTH EVERY DAY  30 tablet  5  . metFORMIN (GLUCOPHAGE-XR) 500 MG 24 hr tablet 4 tabs daily  120 tablet  11  . metoprolol succinate (TOPROL-XL) 25 MG 24 hr tablet Take 1 tablet (25 mg total) by mouth daily.  30 tablet  11  . Multiple Vitamin (MULTIVITAMIN) tablet Take 1 tablet by mouth daily.        Marland Kitchen omeprazole (PRILOSEC) 20 MG capsule Take 20 mg by mouth daily.        . pravastatin  (PRAVACHOL) 40 MG tablet Take 40 mg by mouth at bedtime.        . saxagliptin HCl (ONGLYZA) 5 MG TABS tablet Take by mouth daily.        . pioglitazone (ACTOS) 45 MG tablet Take 1 tablet (45 mg total) by mouth daily.  90 tablet  3    Allergies  Allergen Reactions  . Codeine   . Latex     No family history on file.  BP 122/80  Pulse 66  Temp(Src) 98.1 F (36.7 C) (Oral)  Ht 5\' 10"  (1.778 m)  Wt 210 lb (95.255 kg)  BMI 30.13 kg/m2  SpO2 96%  Review of Systems denies hypoglycemia    Objective:   Physical Exam VITAL SIGNS:  See vs page GENERAL: no distress NECK: There is no palpable thyroid enlargement.  No thyroid nodule is palpable.  No palpable lymphadenopathy at the anterior neck.    Lab Results  Component Value Date   HGBA1C 9.9* 09/06/2011       Assessment & Plan:  Type 2 DM, needs increased rx

## 2011-09-06 NOTE — Patient Instructions (Addendum)
blood tests are being requested for you today.  You will receive a letter with results. check your blood sugar 1 time a day.  vary the time of day when you check, between before the 3 meals, and at bedtime.  also check if you have symptoms of your blood sugar being too high or too low.  please keep a record of the readings and bring it to your next appointment here.  please call us sooner if your blood sugar goes below 70, or if it stays over 200. Please come back for a follow-up appointment in 3 months.   good diet and exercise habits significanly improve the control of your diabetes.  please let me know if you wish to be referred to a dietician.  high blood sugar is very risky to your health.  you should see an eye doctor every year. controlling your blood pressure and cholesterol drastically reduces the damage diabetes does to your body.  this also applies to quitting smoking.  please discuss these with your doctor.  you should take an aspirin every day, unless you have been advised by a doctor not to. (actos [employer])

## 2011-09-07 ENCOUNTER — Encounter: Payer: Self-pay | Admitting: Endocrinology

## 2011-09-07 ENCOUNTER — Telehealth: Payer: Self-pay | Admitting: *Deleted

## 2011-09-07 MED ORDER — PIOGLITAZONE HCL 45 MG PO TABS: 45.0000 mg | ORAL_TABLET | Freq: Every day | ORAL | Status: DC

## 2011-09-07 NOTE — Telephone Encounter (Signed)
Called pt to inform of lab results, left message for pt to callback office (letter also mailed to pt). 

## 2011-09-10 NOTE — Telephone Encounter (Signed)
Pt informed of lab results. 

## 2011-09-21 ENCOUNTER — Other Ambulatory Visit: Payer: Self-pay | Admitting: Endocrinology

## 2011-12-06 ENCOUNTER — Encounter: Payer: Self-pay | Admitting: Endocrinology

## 2011-12-06 ENCOUNTER — Ambulatory Visit (INDEPENDENT_AMBULATORY_CARE_PROVIDER_SITE_OTHER): Payer: Self-pay | Admitting: Endocrinology

## 2011-12-06 ENCOUNTER — Other Ambulatory Visit (INDEPENDENT_AMBULATORY_CARE_PROVIDER_SITE_OTHER): Payer: Self-pay

## 2011-12-06 VITALS — BP 120/80 | HR 72 | Temp 98.1°F | Ht 70.0 in | Wt 218.0 lb

## 2011-12-06 DIAGNOSIS — E119 Type 2 diabetes mellitus without complications: Secondary | ICD-10-CM

## 2011-12-06 LAB — HEMOGLOBIN A1C: Hgb A1c MFr Bld: 7.5 % — ABNORMAL HIGH (ref 4.6–6.5)

## 2011-12-06 NOTE — Progress Notes (Signed)
  Subjective:    Patient ID: Todd Krause, male    DOB: 02/08/1951, 61 y.o.   MRN: [id number]  HPI Pt returns for f/u of type 2 DM (dx'ed 2007; no known complications).  pt states he feels well in general. He is back on the actos now.  no cbg record, but he states cbg's are well-controlled. Past Medical History  Diagnosis Date  . HYPERCHOLESTEROLEMIA 02/03/2009  . 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 HYPERTENSION 08/26/2007  . OSTEOARTHRITIS 01/24/2007  . ERECTILE DYSFUNCTION 01/24/2007    No past surgical history on file.  History   Social History  . Marital Status: Married    Spouse Name: N/A    Number of Children: N/A  . Years of Education: N/A   Occupational History  . [employer] (cattle)    Social History Main Topics  . Smoking status: Former Games developer  . Smokeless tobacco: Not on file  . Alcohol Use: Not on file  . Drug Use: Not on file  . Sexually Active: Not on file   Other Topics Concern  . Not on file   Social History Narrative   No health insurance    Current Outpatient Prescriptions on File Prior to Visit  Medication Sig Dispense Refill  . aspirin 81 MG tablet Take 81 mg by mouth daily.        . fenofibrate 54 MG tablet TAKE ONE TABLET BY MOUTH EVERY DAY.  30 tablet  5  . glimepiride (AMARYL) 4 MG tablet Take 1 tablet (4 mg total) by mouth 2 (two) times daily.  60 tablet  11  . isosorbide mononitrate (IMDUR) 30 MG 24 hr tablet 1/2 tablet daily       . lisinopril-hydrochlorothiazide (PRINZIDE,ZESTORETIC) 10-12.5 MG per tablet TAKE ONE TABLET BY MOUTH EVERY DAY.  30 tablet  5  . metFORMIN (GLUCOPHAGE-XR) 500 MG 24 hr tablet 4 tabs daily  120 tablet  11  . metoprolol succinate (TOPROL-XL) 25 MG 24 hr tablet Take 1 tablet (25 mg total) by mouth daily.  30 tablet  11  . Multiple Vitamin (MULTIVITAMIN) tablet Take 1 tablet by mouth daily.        Marland Kitchen omeprazole (PRILOSEC) 20 MG capsule Take 20 mg by mouth daily.        . pioglitazone (ACTOS) 45 MG tablet Take 1 tablet (45 mg  total) by mouth daily.  90 tablet  3  . pravastatin (PRAVACHOL) 40 MG tablet Take 40 mg by mouth at bedtime.        . saxagliptin HCl (ONGLYZA) 5 MG TABS tablet Take by mouth daily.          Allergies  Allergen Reactions  . Codeine   . Latex    No family history on file.  BP 120/80  Pulse 72  Temp 98.1 F (36.7 C) (Oral)  Ht 5\' 10"  (1.778 m)  Wt 218 lb (98.884 kg)  BMI 31.28 kg/m2  SpO2 96%  Review of Systems denies hypoglycemia    Objective:   Physical Exam VITAL SIGNS:  See vs page GENERAL: no distress Pulses: dorsalis pedis intact bilat.   Feet: no deformity.  no ulcer on the feet.  feet are of normal color and temp.  no edema Neuro: sensation is intact to touch on the feet  Lab Results  Component Value Date   HGBA1C 7.5* 12/06/2011      Assessment & Plan:  DM, with improved control.  When the full effect of actos occurs, he may develop hypoglycemia

## 2011-12-06 NOTE — Patient Instructions (Addendum)
blood tests are being requested for you today.  You will receive a letter with results. Please come back for a follow-up appointment in 3-4 months.

## 2011-12-07 ENCOUNTER — Telehealth: Payer: Self-pay | Admitting: *Deleted

## 2011-12-07 NOTE — Telephone Encounter (Signed)
Called pt to inform of lab results, no answer/unable to leave message (VM not set up yet-letter also mailed to pt).

## 2012-02-27 ENCOUNTER — Ambulatory Visit (INDEPENDENT_AMBULATORY_CARE_PROVIDER_SITE_OTHER): Payer: Self-pay | Admitting: Endocrinology

## 2012-02-27 ENCOUNTER — Encounter: Payer: Self-pay | Admitting: Endocrinology

## 2012-02-27 VITALS — BP 120/82 | HR 89 | Temp 98.1°F | Wt 223.0 lb

## 2012-02-27 DIAGNOSIS — Z23 Encounter for immunization: Secondary | ICD-10-CM

## 2012-02-27 DIAGNOSIS — E119 Type 2 diabetes mellitus without complications: Secondary | ICD-10-CM

## 2012-02-27 NOTE — Progress Notes (Signed)
  Subjective:    Patient ID: Todd Krause, male    DOB: 1950/09/09, 61 y.o.   MRN: [id number]  HPI Pt returns for f/u of type 2 DM (dx'ed 2007; no known complications; rx has been complicated by lack of health insurance).  pt states he feels well in general. He is back on the actos now.  no cbg record, but he states cbg's are in the mid-100's.   Past Medical History  Diagnosis Date  . HYPERCHOLESTEROLEMIA 02/03/2009  . 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 HYPERTENSION 08/26/2007  . OSTEOARTHRITIS 01/24/2007  . ERECTILE DYSFUNCTION 01/24/2007    No past surgical history on file.  History   Social History  . Marital Status: Married    Spouse Name: N/A    Number of Children: N/A  . Years of Education: N/A   Occupational History  . [employer] (cattle)    Social History Main Topics  . Smoking status: Former Games developer  . Smokeless tobacco: Not on file  . Alcohol Use: Not on file  . Drug Use: Not on file  . Sexually Active: Not on file   Other Topics Concern  . Not on file   Social History Narrative   No health insurance    Current Outpatient Prescriptions on File Prior to Visit  Medication Sig Dispense Refill  . aspirin 81 MG tablet Take 81 mg by mouth daily.        . fenofibrate 54 MG tablet TAKE ONE TABLET BY MOUTH EVERY DAY.  30 tablet  5  . glimepiride (AMARYL) 4 MG tablet Take 1 tablet (4 mg total) by mouth 2 (two) times daily.  60 tablet  11  . isosorbide mononitrate (IMDUR) 30 MG 24 hr tablet 1/2 tablet daily       . lisinopril-hydrochlorothiazide (PRINZIDE,ZESTORETIC) 10-12.5 MG per tablet TAKE ONE TABLET BY MOUTH EVERY DAY.  30 tablet  5  . metFORMIN (GLUCOPHAGE-XR) 500 MG 24 hr tablet 4 tabs daily  120 tablet  11  . metoprolol succinate (TOPROL-XL) 25 MG 24 hr tablet Take 1 tablet (25 mg total) by mouth daily.  30 tablet  11  . Multiple Vitamin (MULTIVITAMIN) tablet Take 1 tablet by mouth daily.        Marland Kitchen omeprazole (PRILOSEC) 20 MG capsule Take 20 mg by mouth daily.        .  pioglitazone (ACTOS) 45 MG tablet Take 1 tablet (45 mg total) by mouth daily.  90 tablet  3  . pravastatin (PRAVACHOL) 40 MG tablet Take 40 mg by mouth at bedtime.        . saxagliptin HCl (ONGLYZA) 5 MG TABS tablet Take by mouth daily.          Allergies  Allergen Reactions  . Codeine   . Latex     No family history on file.  BP 120/82  Pulse 89  Temp 98.1 F (36.7 C) (Oral)  Wt 223 lb (101.152 kg)  SpO2 95%    Review of Systems denies hypoglycemia    Objective:   Physical Exam VITAL SIGNS:  See vs page GENERAL: no distress Ext: no edema.   Lab Results  Component Value Date   HGBA1C 7.5* 12/06/2011      Assessment & Plan:  Type 2 DM, apparently well-controlled

## 2012-02-27 NOTE — Patient Instructions (Addendum)
check your blood sugar once a day.  vary the time of day when you check, between before the 3 meals, and at bedtime.  also check if you have symptoms of your blood sugar being too high or too low.  please keep a record of the readings and bring it to your next appointment here.  please call us sooner if your blood sugar goes below 70, or if you have a lot of readings over 200. Please come back for a follow-up appointment in 3 months.   Here are samples of "jentadueto," 2.08/998.  If you take 1 pill, twice a day, this replaces both the metformin and onglyza.

## 2012-03-06 ENCOUNTER — Ambulatory Visit: Payer: Self-pay | Admitting: Endocrinology

## 2012-03-06 ENCOUNTER — Other Ambulatory Visit: Payer: Self-pay | Admitting: Endocrinology

## 2012-03-13 ENCOUNTER — Other Ambulatory Visit: Payer: Self-pay | Admitting: Endocrinology

## 2012-03-20 ENCOUNTER — Other Ambulatory Visit: Payer: Self-pay | Admitting: Endocrinology

## 2012-05-07 ENCOUNTER — Other Ambulatory Visit: Payer: Self-pay | Admitting: Endocrinology

## 2012-05-08 ENCOUNTER — Telehealth: Payer: Self-pay | Admitting: Endocrinology

## 2012-05-08 ENCOUNTER — Other Ambulatory Visit: Payer: Self-pay

## 2012-05-08 NOTE — Telephone Encounter (Signed)
The patient called to stated that, yes, he is no longer taking Glipizide and that it was a mistake on his part and we may disregard that refill request.

## 2012-05-08 NOTE — Telephone Encounter (Signed)
Spoke with Charmian Muff at [facility] rx was canclled

## 2012-05-27 ENCOUNTER — Ambulatory Visit (INDEPENDENT_AMBULATORY_CARE_PROVIDER_SITE_OTHER): Payer: No Typology Code available for payment source | Admitting: Endocrinology

## 2012-05-27 ENCOUNTER — Encounter: Payer: Self-pay | Admitting: Endocrinology

## 2012-05-27 VITALS — BP 130/80 | HR 84 | Wt 214.0 lb

## 2012-05-27 DIAGNOSIS — E119 Type 2 diabetes mellitus without complications: Secondary | ICD-10-CM

## 2012-05-27 NOTE — Progress Notes (Signed)
  Subjective:    Patient ID: Todd Krause, male    DOB: [birth year], 62 y.o.   MRN: [id number]  HPI Pt returns for f/u of type 2 DM (dx'ed 2007; no known complications; rx has been complicated by lack of health insurance).  pt states he feels well in general. no cbg record, but he states cbg's are well-controlled.   Past Medical History  Diagnosis Date  . HYPERCHOLESTEROLEMIA 02/03/2009  . 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 HYPERTENSION 08/26/2007  . OSTEOARTHRITIS 01/24/2007  . ERECTILE DYSFUNCTION 01/24/2007    No past surgical history on file.  History   Social History  . Marital Status: Married    Spouse Name: N/A    Number of Children: N/A  . Years of Education: N/A   Occupational History  . [employer] (cattle)    Social History Main Topics  . Smoking status: Former Games developer  . Smokeless tobacco: Not on file  . Alcohol Use: Not on file  . Drug Use: Not on file  . Sexually Active: Not on file   Other Topics Concern  . Not on file   Social History Narrative   No health insurance    Current Outpatient Prescriptions on File Prior to Visit  Medication Sig Dispense Refill  . aspirin 81 MG tablet Take 81 mg by mouth daily.        . fenofibrate 54 MG tablet TAKE ONE TABLET BY MOUTH EVERY DAY  30 tablet  3  . glimepiride (AMARYL) 4 MG tablet TAKE ONE TABLET BY MOUTH TWICE DAILY  60 tablet  10  . isosorbide mononitrate (IMDUR) 30 MG 24 hr tablet 1/2 tablet daily       . lisinopril-hydrochlorothiazide (PRINZIDE,ZESTORETIC) 10-12.5 MG per tablet TAKE ONE TABLET BY MOUTH EVERY DAY  30 tablet  5  . metFORMIN (GLUCOPHAGE-XR) 500 MG 24 hr tablet 4 tabs daily  120 tablet  11  . metoprolol succinate (TOPROL-XL) 25 MG 24 hr tablet TAKE ONE TABLET BY MOUTH DAILY.  30 tablet  10  . Multiple Vitamin (MULTIVITAMIN) tablet Take 1 tablet by mouth daily.        Marland Kitchen omeprazole (PRILOSEC) 20 MG capsule Take 20 mg by mouth daily.        . pioglitazone (ACTOS) 45 MG tablet Take 1 tablet (45 mg total) by  mouth daily.  90 tablet  3  . pravastatin (PRAVACHOL) 40 MG tablet Take 40 mg by mouth at bedtime.        . saxagliptin HCl (ONGLYZA) 5 MG TABS tablet Take by mouth daily.          Allergies  Allergen Reactions  . Codeine   . Latex     No family history on file.  BP 130/80  Pulse 84  Wt 214 lb (97.07 kg)  SpO2 95%  Review of Systems denies hypoglycemia    Objective:   Physical Exam Pulses: dorsalis pedis intact bilat.   Feet: no deformity.  no ulcer on the feet.  feet are of normal color and temp.  no edema Neuro: sensation is intact to touch on the feet     Assessment & Plan:  DM, apparently well-controlled.  However, he should substitute an alternative for glimepiride as much as possible

## 2012-05-27 NOTE — Patient Instructions (Addendum)
check your blood sugar once a day.  vary the time of day when you check, between before the 3 meals, and at bedtime.  also check if you have symptoms of your blood sugar being too high or too low.  please keep a record of the readings and bring it to your next appointment here.  please call us sooner if your blood sugar goes below 70, or if you have a lot of readings over 200. blood tests are being requested for you today.  We'll contact you with results. Please come back for a follow-up appointment in 3 months.   Here are samples of "jentadueto," 2.08/998.  If you take 1 pill, twice a day, this replaces both the metformin and onglyza.   Based on the results, we may be able to partially replace the glimepiride with "bromocriptine."

## 2012-05-28 LAB — HEMOGLOBIN A1C: Hgb A1c MFr Bld: 7.5 % — ABNORMAL HIGH (ref 4.6–6.5)

## 2012-08-12 ENCOUNTER — Other Ambulatory Visit: Payer: Self-pay | Admitting: Endocrinology

## 2012-08-26 ENCOUNTER — Ambulatory Visit: Payer: No Typology Code available for payment source | Admitting: Endocrinology

## 2012-08-26 DIAGNOSIS — Z0289 Encounter for other administrative examinations: Secondary | ICD-10-CM

## 2012-09-09 ENCOUNTER — Other Ambulatory Visit: Payer: Self-pay | Admitting: Endocrinology

## 2012-09-10 ENCOUNTER — Other Ambulatory Visit: Payer: Self-pay | Admitting: *Deleted

## 2012-09-10 MED ORDER — FENOFIBRATE 54 MG PO TABS: 54.0000 mg | ORAL_TABLET | Freq: Every day | ORAL | Status: DC

## 2012-09-12 ENCOUNTER — Encounter: Payer: Self-pay | Admitting: Endocrinology

## 2012-09-12 ENCOUNTER — Ambulatory Visit (INDEPENDENT_AMBULATORY_CARE_PROVIDER_SITE_OTHER): Payer: No Typology Code available for payment source | Admitting: Endocrinology

## 2012-09-12 VITALS — BP 122/76 | HR 70 | Ht 70.0 in | Wt 200.0 lb

## 2012-09-12 DIAGNOSIS — E119 Type 2 diabetes mellitus without complications: Secondary | ICD-10-CM

## 2012-09-12 DIAGNOSIS — R200 Anesthesia of skin: Secondary | ICD-10-CM

## 2012-09-12 DIAGNOSIS — R209 Unspecified disturbances of skin sensation: Secondary | ICD-10-CM

## 2012-09-12 MED ORDER — INSULIN ASPART 100 UNIT/ML FLEXPEN: 10.0000 [IU] | PEN_INJECTOR | Freq: Three times a day (TID) | SUBCUTANEOUS | Status: DC

## 2012-09-12 NOTE — Progress Notes (Signed)
  Subjective:    Patient ID: Todd Krause, male    DOB: 1951/02/16, 62 y.o.   MRN: [id number]  HPI Pt returns for f/u of type 2 DM (dx'ed 2007; no known complications); he has regained his health insurance).  no cbg record, but he states cbg's vary 175-400.  Pt states 1 week of slight numbness of the feet, but no assoc pain.   Past Medical History  Diagnosis Date  . HYPERCHOLESTEROLEMIA 02/03/2009  . 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 HYPERTENSION 08/26/2007  . OSTEOARTHRITIS 01/24/2007  . ERECTILE DYSFUNCTION 01/24/2007    No past surgical history on file.  History   Social History  . Marital Status: Married    Spouse Name: N/A    Number of Children: N/A  . Years of Education: N/A   Occupational History  . [employer] (cattle)    Social History Main Topics  . Smoking status: Former Games developer  . Smokeless tobacco: Not on file  . Alcohol Use: Not on file  . Drug Use: Not on file  . Sexually Active: Not on file   Other Topics Concern  . Not on file   Social History Narrative   No health insurance    Current Outpatient Prescriptions on File Prior to Visit  Medication Sig Dispense Refill  . aspirin 81 MG tablet Take 81 mg by mouth daily.        . fenofibrate 54 MG tablet Take 1 tablet (54 mg total) by mouth daily.  30 tablet  2  . isosorbide mononitrate (IMDUR) 30 MG 24 hr tablet 1/2 tablet daily       . lisinopril-hydrochlorothiazide (PRINZIDE,ZESTORETIC) 10-12.5 MG per tablet TAKE ONE TABLET BY MOUTH EVERY DAY  30 tablet  5  . metFORMIN (GLUCOPHAGE-XR) 500 MG 24 hr tablet 4 tabs daily  120 tablet  11  . metoprolol succinate (TOPROL-XL) 25 MG 24 hr tablet TAKE ONE TABLET BY MOUTH DAILY.  30 tablet  10  . Multiple Vitamin (MULTIVITAMIN) tablet Take 1 tablet by mouth daily.        Marland Kitchen omeprazole (PRILOSEC) 20 MG capsule Take 20 mg by mouth daily.        . pravastatin (PRAVACHOL) 40 MG tablet Take 40 mg by mouth at bedtime.        . saxagliptin HCl (ONGLYZA) 5 MG TABS tablet Take by mouth  daily.         No current facility-administered medications on file prior to visit.    Allergies  Allergen Reactions  . Codeine   . Latex     No family history on file.  BP 122/76  Pulse 70  Ht 5\' 10"  (1.778 m)  Wt 200 lb (90.719 kg)  BMI 28.7 kg/m2  SpO2 97%    Review of Systems Denies weight change, but he has polyuria    Objective:   Physical Exam VITAL SIGNS:  See vs page GENERAL: no distress      Assessment & Plan:  DM: worse Numbness, new, uncertain etiology.  Possibly related to hyperglycemia.

## 2012-09-12 NOTE — Patient Instructions (Addendum)
check your blood sugar once a day.  vary the time of day when you check, between before the 3 meals, and at bedtime.  also check if you have symptoms of your blood sugar being too high or too low.  please keep a record of the readings and bring it to your next appointment here.  please call us sooner if your blood sugar goes below 70, or if you have a lot of readings over 200.   blood and urine tests are being requested for you today.  We'll contact you with results.  Please come back for a follow-up appointment in 2 weeks.   Please see dr Kriste Basque soon, now that you have regained your insurance. Please stop the actos and glimepiride. Start novolog 10 units 3 times a day (just before each meal).   Todd Krause

## 2012-09-15 LAB — MICROALBUMIN / CREATININE URINE RATIO
Creatinine,U: 43.8 mg/dL
Microalb, Ur: 1.1 mg/dL (ref 0.0–1.9)

## 2012-09-15 LAB — BASIC METABOLIC PANEL
BUN: 18 mg/dL (ref 6–23)
Calcium: 9.4 mg/dL (ref 8.4–10.5)
Chloride: 95 mEq/L — ABNORMAL LOW (ref 96–112)
Creatinine, Ser: 0.9 mg/dL (ref 0.4–1.5)
GFR: 92.57 mL/min (ref >60.00)

## 2012-09-15 LAB — VITAMIN B12: Vitamin B-12: >1500 pg/mL — ABNORMAL HIGH (ref 211–911)

## 2012-09-26 ENCOUNTER — Other Ambulatory Visit: Payer: Self-pay | Admitting: Endocrinology

## 2012-09-29 ENCOUNTER — Other Ambulatory Visit: Payer: Self-pay | Admitting: *Deleted

## 2012-09-29 MED ORDER — LISINOPRIL-HYDROCHLOROTHIAZIDE 10-12.5 MG PO TABS: 1.0000 | ORAL_TABLET | Freq: Every day | ORAL | Status: DC

## 2012-09-30 ENCOUNTER — Ambulatory Visit (INDEPENDENT_AMBULATORY_CARE_PROVIDER_SITE_OTHER): Payer: No Typology Code available for payment source | Admitting: Endocrinology

## 2012-09-30 ENCOUNTER — Other Ambulatory Visit: Payer: Self-pay | Admitting: Cardiovascular Disease

## 2012-09-30 ENCOUNTER — Encounter: Payer: Self-pay | Admitting: Endocrinology

## 2012-09-30 VITALS — BP 130/70 | HR 80 | Ht 68.0 in | Wt 202.0 lb

## 2012-09-30 DIAGNOSIS — E119 Type 2 diabetes mellitus without complications: Secondary | ICD-10-CM

## 2012-09-30 DIAGNOSIS — E1049 Type 1 diabetes mellitus with other diabetic neurological complication: Secondary | ICD-10-CM

## 2012-09-30 MED ORDER — "INSULIN SYRINGE-NEEDLE U-100 31G X 5/16"" 0.5 ML MISC": 1.0000 | Freq: Three times a day (TID) | Status: DC

## 2012-09-30 NOTE — Progress Notes (Signed)
  Subjective:    Patient ID: Todd Krause, male    DOB: Jul 30, 1950, 62 y.o.   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  he brings a record of his cbg's which i have reviewed today.  It varies from 150-250.  There is no trend throughout the day.  He often does not eat breakfast.  pt states he feels well in general. Past Medical History  Diagnosis Date  . HYPERCHOLESTEROLEMIA 02/03/2009  . 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 HYPERTENSION 08/26/2007  . OSTEOARTHRITIS 01/24/2007  . ERECTILE DYSFUNCTION 01/24/2007    No past surgical history on file.  History   Social History  . Marital Status: Married    Spouse Name: N/A    Number of Children: N/A  . Years of Education: N/A   Occupational History  . [employer] (cattle)    Social History Main Topics  . Smoking status: Former Games developer  . Smokeless tobacco: Not on file  . Alcohol Use: Not on file  . Drug Use: Not on file  . Sexually Active: Not on file   Other Topics Concern  . Not on file   Social History Narrative   No health insurance    Current Outpatient Prescriptions on File Prior to Visit  Medication Sig Dispense Refill  . aspirin 81 MG tablet Take 81 mg by mouth daily.        . fenofibrate 54 MG tablet Take 1 tablet (54 mg total) by mouth daily.  30 tablet  2  . isosorbide mononitrate (IMDUR) 30 MG 24 hr tablet 1/2 tablet daily       . lisinopril-hydrochlorothiazide (PRINZIDE,ZESTORETIC) 10-12.5 MG per tablet Take 1 tablet by mouth daily.  30 tablet  4  . metoprolol succinate (TOPROL-XL) 25 MG 24 hr tablet TAKE ONE TABLET BY MOUTH DAILY.  30 tablet  10  . Multiple Vitamin (MULTIVITAMIN) tablet Take 1 tablet by mouth daily.        . pravastatin (PRAVACHOL) 40 MG tablet Take 40 mg by mouth at bedtime.         No current facility-administered medications on file prior to visit.     Allergies  Allergen Reactions  . Codeine   . Latex    No family history on file.  BP 130/70  Pulse 80  Ht 5\' 8"  (1.727 m)  Wt 202 lb (91.627 kg)  BMI 30.72 kg/m2  SpO2 98%  Review of Systems denies hypoglycemia and weight change    Objective:   Physical Exam VITAL SIGNS:  See vs page GENERAL: no distress SKIN:  Insulin injection sites at the anterior abdomen are normal, except for a few ecchymoses.   Lab Results  Component Value Date   HGBA1C 10.6* 09/12/2012      Assessment & Plan:  DM: he needs increased rx.  This insulin regimen was chosen from multiple options, as it best matches his insulin to his changing requirements throughout the day.  The benefits of glycemic control must be weighed against the risks of hypoglycemia.

## 2012-09-30 NOTE — Patient Instructions (Addendum)
check your blood sugar once a day.  vary the time of day when you check, between before the 3 meals, and at bedtime.  also check if you have symptoms of your blood sugar being too high or too low.  please keep a record of the readings and bring it to your next appointment here.  please call us sooner if your blood sugar goes below 70, or if you have a lot of readings over 200.   Please come back for a follow-up appointment in 2-3 weeks.   Please stop the metformin and onglyza.   Please increase the novolog to 20 units 3 times a day (just before each meal).

## 2012-10-21 ENCOUNTER — Ambulatory Visit (INDEPENDENT_AMBULATORY_CARE_PROVIDER_SITE_OTHER): Payer: No Typology Code available for payment source | Admitting: Endocrinology

## 2012-10-21 ENCOUNTER — Encounter: Payer: Self-pay | Admitting: Endocrinology

## 2012-10-21 VITALS — BP 126/74 | HR 75 | Wt 203.0 lb

## 2012-10-21 DIAGNOSIS — E1049 Type 1 diabetes mellitus with other diabetic neurological complication: Secondary | ICD-10-CM

## 2012-10-21 NOTE — Patient Instructions (Addendum)
check your blood sugar once a day.  vary the time of day when you check, between before the 3 meals, and at bedtime.  also check if you have symptoms of your blood sugar being too high or too low.  please keep a record of the readings and bring it to your next appointment here.  please call us sooner if your blood sugar goes below 70, or if you have a lot of readings over 200.   Please come back for a follow-up appointment in 1 month.    Please change the novolog to 3 times a day (just before each meal): 20-15-30 units.

## 2012-10-21 NOTE — Progress Notes (Signed)
Subjective:    Patient ID: Todd Krause, male    DOB: [birth year], 62 y.o.   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 he brings a record of his cbg's which i have reviewed today.  It varies from 90-240.  It is highest at hs and am.  It is lowest in the afternoon.  pt states he feels well in general. Past Medical History  Diagnosis Date  . HYPERCHOLESTEROLEMIA 02/03/2009  . 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 HYPERTENSION 08/26/2007  . OSTEOARTHRITIS 01/24/2007  . ERECTILE DYSFUNCTION 01/24/2007    No past surgical history on file.  History   Social History  . Marital Status: Married    Spouse Name: N/A    Number of Children: N/A  . Years of Education: N/A   Occupational History  . [employer] (cattle)    Social History Main Topics  . Smoking status: Former Games developer  . Smokeless tobacco: Not on file  . Alcohol Use: Not on file  . Drug Use: Not on file  . Sexually Active: Not on file   Other Topics Concern  . Not on file   Social History Narrative   No health insurance    Current Outpatient Prescriptions on File Prior to Visit  Medication Sig Dispense Refill  . aspirin 81 MG tablet Take 81 mg by mouth daily.        . fenofibrate 54 MG tablet Take 1 tablet (54 mg total) by mouth daily.  30 tablet  2  . insulin aspart (NOVOLOG) 100 unit/mL SOLN FlexPen 3 times a day (just before each meal) 20-15-30 units, and and pen needles 3/day      . Insulin Syringe-Needle U-100 (RELION INSULIN SYR 0.5ML/31G) 31G X 5/16" 0.5 ML MISC 1 Syringe by Does not apply route 3 (three) times daily with meals.  90 each  3  . isosorbide mononitrate (IMDUR) 30 MG 24 hr tablet 1/2 tablet daily       . lisinopril-hydrochlorothiazide (PRINZIDE,ZESTORETIC) 10-12.5 MG per tablet Take 1 tablet by mouth daily.  30 tablet  4  . metoprolol succinate (TOPROL-XL) 25 MG 24 hr  tablet TAKE ONE TABLET BY MOUTH DAILY.  30 tablet  10  . Multiple Vitamin (MULTIVITAMIN) tablet Take 1 tablet by mouth daily.        Marland Kitchen omeprazole (PRILOSEC) 20 MG capsule TAKE ONE CAPSULE BY MOUTH EVERY DAY  30 capsule  0  . pravastatin (PRAVACHOL) 40 MG tablet Take 40 mg by mouth at bedtime.         No current facility-administered medications on file prior to visit.    Allergies  Allergen Reactions  . Codeine   . Latex     No family history on file.  BP 126/74  Pulse 75  Wt 203 lb (92.08 kg)  BMI 30.87 kg/m2  SpO2 97%    Review of Systems denies hypoglycemia and weight change.    Objective:   Physical Exam VITAL SIGNS:  See vs page GENERAL: no distress NECK: There is no palpable thyroid enlargement.  No thyroid nodule is palpable.  No palpable lymphadenopathy at the anterior neck.      Assessment & Plan:  DM: This insulin regimen was chosen from multiple options, as it best matches his insulin to his changing requirements throughout the day.  The benefits of glycemic control must be weighed  against the risks of hypoglycemia.  He needs increased rx

## 2012-11-18 ENCOUNTER — Ambulatory Visit (INDEPENDENT_AMBULATORY_CARE_PROVIDER_SITE_OTHER): Payer: No Typology Code available for payment source | Admitting: Endocrinology

## 2012-11-18 ENCOUNTER — Encounter: Payer: Self-pay | Admitting: Endocrinology

## 2012-11-18 VITALS — BP 110/50 | HR 79 | Temp 98.2°F | Ht 68.0 in | Wt 208.0 lb

## 2012-11-18 DIAGNOSIS — E1049 Type 1 diabetes mellitus with other diabetic neurological complication: Secondary | ICD-10-CM

## 2012-11-18 NOTE — Progress Notes (Signed)
Subjective:    Patient ID: Todd Krause, male    DOB: [birth year], 62 y.o.   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 he brings a record of his cbg's which i have reviewed today.  It varies from 85-297, but most are in the low to mid-100's.  It is highest in am.  He does not check at hs.  It is lowest in the afternoon.  pt states he feels well in general.   Past Medical History  Diagnosis Date  . HYPERCHOLESTEROLEMIA 02/03/2009  . 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 HYPERTENSION 08/26/2007  . OSTEOARTHRITIS 01/24/2007  . ERECTILE DYSFUNCTION 01/24/2007    History reviewed. No pertinent past surgical history.  History   Social History  . Marital Status: Married    Spouse Name: N/A    Number of Children: N/A  . Years of Education: N/A   Occupational History  . [employer] (cattle)    Social History Main Topics  . Smoking status: Former Games developer  . Smokeless tobacco: Not on file  . Alcohol Use: Not on file  . Drug Use: Not on file  . Sexually Active: Not on file   Other Topics Concern  . Not on file   Social History Narrative   No health insurance    Current Outpatient Prescriptions on File Prior to Visit  Medication Sig Dispense Refill  . aspirin 81 MG tablet Take 81 mg by mouth daily.        . fenofibrate 54 MG tablet Take 1 tablet (54 mg total) by mouth daily.  30 tablet  2  . insulin aspart (NOVOLOG) 100 unit/mL SOLN FlexPen 3 times a day (just before each meal) 20-10-35 units, and and pen needles 3/day      . Insulin Syringe-Needle U-100 (RELION INSULIN SYR 0.5ML/31G) 31G X 5/16" 0.5 ML MISC 1 Syringe by Does not apply route 3 (three) times daily with meals.  90 each  3  . isosorbide mononitrate (IMDUR) 30 MG 24 hr tablet 1/2 tablet daily       . lisinopril-hydrochlorothiazide (PRINZIDE,ZESTORETIC) 10-12.5 MG per tablet Take 1 tablet  by mouth daily.  30 tablet  4  . metoprolol succinate (TOPROL-XL) 25 MG 24 hr tablet TAKE ONE TABLET BY MOUTH DAILY.  30 tablet  10  . Multiple Vitamin (MULTIVITAMIN) tablet Take 1 tablet by mouth daily.        Marland Kitchen omeprazole (PRILOSEC) 20 MG capsule TAKE ONE CAPSULE BY MOUTH EVERY DAY  30 capsule  0  . pravastatin (PRAVACHOL) 40 MG tablet Take 40 mg by mouth at bedtime.         No current facility-administered medications on file prior to visit.   Allergies  Allergen Reactions  . Codeine   . Latex    History reviewed. No pertinent family history.  BP 110/50  Pulse 79  Temp(Src) 98.2 F (36.8 C) (Oral)  Ht 5\' 8"  (1.727 m)  Wt 208 lb (94.348 kg)  BMI 31.63 kg/m2  SpO2 95%  Review of Systems denies hypoglycemia and weight change.      Objective:   Physical Exam VITAL SIGNS:  See vs page.   GENERAL: no distress.       Assessment & Plan:  DM: he needs increased rx.  This insulin regimen was chosen from multiple options, as it best matches his insulin to  his changing requirements throughout the day.  The benefits of glycemic control must be weighed against the risks of hypoglycemia.

## 2012-11-18 NOTE — Patient Instructions (Addendum)
check your blood sugar once a day.  vary the time of day when you check, between before the 3 meals, and at bedtime.  also check if you have symptoms of your blood sugar being too high or too low.  please keep a record of the readings and bring it to your next appointment here.  please call us sooner if your blood sugar goes below 70, or if you have a lot of readings over 200.   Please come back for a follow-up appointment in 1 month.    Please change the novolog to 3 times a day (just before each meal): 20-10-35 units.

## 2012-12-23 ENCOUNTER — Ambulatory Visit: Payer: No Typology Code available for payment source | Admitting: Endocrinology

## 2012-12-31 ENCOUNTER — Ambulatory Visit: Payer: No Typology Code available for payment source | Admitting: Endocrinology

## 2013-01-07 ENCOUNTER — Ambulatory Visit: Payer: No Typology Code available for payment source | Admitting: Endocrinology

## 2013-02-09 ENCOUNTER — Ambulatory Visit: Payer: No Typology Code available for payment source | Admitting: Internal Medicine

## 2013-02-19 ENCOUNTER — Other Ambulatory Visit: Payer: Self-pay | Admitting: *Deleted

## 2013-02-19 MED ORDER — FENOFIBRATE 54 MG PO TABS: 54.0000 mg | ORAL_TABLET | Freq: Every day | ORAL | Status: DC

## 2013-03-05 ENCOUNTER — Other Ambulatory Visit: Payer: Self-pay

## 2013-03-12 ENCOUNTER — Other Ambulatory Visit: Payer: Self-pay | Admitting: Endocrinology

## 2013-03-18 ENCOUNTER — Ambulatory Visit (INDEPENDENT_AMBULATORY_CARE_PROVIDER_SITE_OTHER): Payer: Self-pay | Admitting: Endocrinology

## 2013-03-18 ENCOUNTER — Encounter: Payer: Self-pay | Admitting: Endocrinology

## 2013-03-18 VITALS — BP 130/62 | HR 94 | Temp 97.9°F | Resp 12 | Wt 219.0 lb

## 2013-03-18 DIAGNOSIS — E1049 Type 1 diabetes mellitus with other diabetic neurological complication: Secondary | ICD-10-CM

## 2013-03-18 DIAGNOSIS — Z23 Encounter for immunization: Secondary | ICD-10-CM

## 2013-03-18 DIAGNOSIS — E1142 Type 2 diabetes mellitus with diabetic polyneuropathy: Secondary | ICD-10-CM

## 2013-03-18 MED ORDER — LISINOPRIL-HYDROCHLOROTHIAZIDE 10-12.5 MG PO TABS: 1.0000 | ORAL_TABLET | Freq: Every day | ORAL | Status: DC

## 2013-03-18 NOTE — Progress Notes (Signed)
  Subjective:    Patient ID: Todd Krause, male    DOB: 02/05/1951, 62 y.o.   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  no cbg record, but states cbg's are well-controlled.  Since last ov, he had only 1 episode of hypoglycemia, and this was mild.   Past Medical History  Diagnosis Date  . HYPERCHOLESTEROLEMIA 02/03/2009  . 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 HYPERTENSION 08/26/2007  . OSTEOARTHRITIS 01/24/2007  . ERECTILE DYSFUNCTION 01/24/2007    History reviewed. No pertinent past surgical history.  History   Social History  . Marital Status: Married    Spouse Name: N/A    Number of Children: N/A  . Years of Education: N/A   Occupational History  . [employer] (cattle)    Social History Main Topics  . Smoking status: Former Games developer  . Smokeless tobacco: Not on file  . Alcohol Use: Not on file  . Drug Use: Not on file  . Sexual Activity: Not on file   Other Topics Concern  . Not on file   Social History Narrative   No health insurance    Current Outpatient Prescriptions on File Prior to Visit  Medication Sig Dispense Refill  . aspirin 81 MG tablet Take 81 mg by mouth daily.        . fenofibrate 54 MG tablet Take 1 tablet (54 mg total) by mouth daily.  30 tablet  3  . insulin aspart (NOVOLOG) 100 unit/mL SOLN FlexPen 3 times a day (just before each meal) 20-10-35 units, and and pen needles 3/day      . Insulin Syringe-Needle U-100 (RELION INSULIN SYR 0.5ML/31G) 31G X 5/16" 0.5 ML MISC 1 Syringe by Does not apply route 3 (three) times daily with meals.  90 each  3  . isosorbide mononitrate (IMDUR) 30 MG 24 hr tablet 1/2 tablet daily       . metoprolol succinate (TOPROL-XL) 25 MG 24 hr tablet TAKE ONE TABLET BY MOUTH DAILY.  30 tablet  10  . Multiple Vitamin (MULTIVITAMIN) tablet Take 1 tablet by mouth  daily.        Marland Kitchen omeprazole (PRILOSEC) 20 MG capsule TAKE ONE CAPSULE BY MOUTH EVERY DAY  30 capsule  0  . pravastatin (PRAVACHOL) 40 MG tablet Take 40 mg by mouth at bedtime.         No current facility-administered medications on file prior to visit.    Allergies  Allergen Reactions  . Codeine   . Latex     History reviewed. No pertinent family history.  BP 130/62  Pulse 94  Temp(Src) 97.9 F (36.6 C) (Oral)  Resp 12  Wt 219 lb (99.338 kg)  SpO2 97%  Review of Systems Denies LOC and weight change    Objective:   Physical Exam VITAL SIGNS:  See vs page GENERAL: no distress      Assessment & Plan:  DM: apparently well-controlled

## 2013-03-18 NOTE — Patient Instructions (Addendum)
check your blood sugar once a day.  vary the time of day when you check, between before the 3 meals, and at bedtime.  also check if you have symptoms of your blood sugar being too high or too low.  please keep a record of the readings and bring it to your next appointment here.  please call us sooner if your blood sugar goes below 70, or if you have a lot of readings over 200.   Please continue the same insulin.  Please come back for a follow-up appointment in 2 months.    Please also see dr Kriste Basque then for a regular physical (when you will have [employer] medicare).

## 2013-05-12 ENCOUNTER — Telehealth: Payer: Self-pay | Admitting: Pulmonary Disease

## 2013-05-12 NOTE — Telephone Encounter (Signed)
Copied from MyChart:    Appointment Request From: Todd Krause  With Provider: Michele McalpineNADEL,SCOTT M, MD [-Primary Care Physician-]  Preferred Date Range: From 05/11/2013 To 05/15/2013  Preferred Times: Friday Afternoon  Reason for visit: Annual Physical  Comments:

## 2013-05-12 NOTE — Telephone Encounter (Signed)
[facility]  SN is not accepting new pt's d/t retiring from PCP this year Called pt home # NA nad no VM Called alternate # and LMTCB x1 for pt

## 2013-05-13 ENCOUNTER — Other Ambulatory Visit: Payer: Self-pay | Admitting: *Deleted

## 2013-05-13 MED ORDER — FENOFIBRATE 54 MG PO TABS: 54.0000 mg | ORAL_TABLET | Freq: Every day | ORAL | Status: DC

## 2013-05-13 MED ORDER — OMEPRAZOLE 20 MG PO CPDR: 20.0000 mg | DELAYED_RELEASE_CAPSULE | Freq: Every day | ORAL | Status: DC

## 2013-05-13 MED ORDER — METOPROLOL SUCCINATE ER 25 MG PO TB24: 25.0000 mg | ORAL_TABLET | Freq: Every day | ORAL | Status: DC

## 2013-05-13 MED ORDER — LISINOPRIL-HYDROCHLOROTHIAZIDE 10-12.5 MG PO TABS: 1.0000 | ORAL_TABLET | Freq: Every day | ORAL | Status: DC

## 2013-05-13 NOTE — Telephone Encounter (Signed)
Spoke with the pt spouse and advised they will need to establish with another PCP. Offered info on [facility]. They will decide. Carron CurieJennifer Castillo, CMA

## 2013-05-19 ENCOUNTER — Encounter: Payer: Self-pay | Admitting: Endocrinology

## 2013-05-19 ENCOUNTER — Ambulatory Visit (INDEPENDENT_AMBULATORY_CARE_PROVIDER_SITE_OTHER): Payer: Medicare HMO | Admitting: Endocrinology

## 2013-05-19 VITALS — BP 120/60 | HR 98 | Temp 98.2°F | Ht 68.0 in | Wt 213.0 lb

## 2013-05-19 DIAGNOSIS — E1049 Type 1 diabetes mellitus with other diabetic neurological complication: Secondary | ICD-10-CM

## 2013-05-19 MED ORDER — INSULIN ASPART 100 UNIT/ML FLEXPEN: PEN_INJECTOR | SUBCUTANEOUS | Status: DC

## 2013-05-19 NOTE — Progress Notes (Signed)
   Subjective:    Patient ID: Todd Krause, male    DOB: Apr 04, 1951, 63 y.o.   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  no cbg record, but states cbg's are all in the 200's.  There is no trend throughout the day.  He says he seldom misses the insulin.   Past Medical History  Diagnosis Date  . HYPERCHOLESTEROLEMIA 02/03/2009  . 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 HYPERTENSION 08/26/2007  . OSTEOARTHRITIS 01/24/2007  . ERECTILE DYSFUNCTION 01/24/2007    No past surgical history on file.  History   Social History  . Marital Status: Married    Spouse Name: N/A    Number of Children: N/A  . Years of Education: N/A   Occupational History  . [employer]e NormanFarmer (cattle)    Social History Main Topics  . Smoking status: Former Games developermoker  . Smokeless tobacco: Not on file  . Alcohol Use: Not on file  . Drug Use: Not on file  . Sexual Activity: Not on file   Other Topics Concern  . Not on file   Social History Narrative   No health insurance    Current Outpatient Prescriptions on File Prior to Visit  Medication Sig Dispense Refill  . aspirin 81 MG tablet Take 81 mg by mouth daily.        . fenofibrate 54 MG tablet Take 1 tablet (54 mg total) by mouth daily.  90 tablet  0  . Insulin Syringe-Needle U-100 (RELION INSULIN SYR 0.5ML/31G) 31G X 5/16" 0.5 ML MISC 1 Syringe by Does not apply route 3 (three) times daily with meals.  90 each  3  . isosorbide mononitrate (IMDUR) 30 MG 24 hr tablet 1/2 tablet daily       . lisinopril-hydrochlorothiazide (PRINZIDE,ZESTORETIC) 10-12.5 MG per tablet Take 1 tablet by mouth daily.  90 tablet  0  . metoprolol succinate (TOPROL-XL) 25 MG 24 hr tablet Take 1 tablet (25 mg total) by mouth daily.  90 tablet  0  . Multiple Vitamin (MULTIVITAMIN) tablet  Take 1 tablet by mouth daily.        Marland Kitchen. omeprazole (PRILOSEC) 20 MG capsule Take 1 capsule (20 mg total) by mouth daily.  90 capsule  0  . pravastatin (PRAVACHOL) 40 MG tablet Take 40 mg by mouth at bedtime.         No current facility-administered medications on file prior to visit.    Allergies  Allergen Reactions  . Codeine   . Latex     No family history on file.  BP 120/60  Pulse 98  Temp(Src) 98.2 F (36.8 C) (Oral)  Ht 5\' 8"  (1.727 m)  Wt 213 lb (96.616 kg)  BMI 32.39 kg/m2  SpO2 94%  Review of Systems denies hypoglycemia and weight change.      Objective:   Physical Exam VITAL SIGNS:  See vs page GENERAL: no distress     Assessment & Plan:  DM: he needs increased rx.  This insulin regimen was chosen from multiple options, as it best matches his insulin to his changing requirements throughout the day.  The benefits of glycemic control must be weighed against the risks of hypoglycemia.   Dyslipidemia: this can be affected by glycemic control. HTN: well-controlled

## 2013-05-19 NOTE — Patient Instructions (Signed)
check your blood sugar twice a day.  vary the time of day when you check, between before the 3 meals, and at bedtime.  also check if you have symptoms of your blood sugar being too high or too low.  please keep a record of the readings and bring it to your next appointment here.  please call us sooner if your blood sugar goes below 70, or if you have a lot of readings over 200.   Please increase the novolog, to the numbers noted below.   blood tests are being requested for you today.  We'll contact you with results.   Please come back for a follow-up appointment in 3 months.

## 2013-05-20 LAB — LIPID PANEL
Cholesterol: 171 mg/dL (ref 0–200)
HDL: 38 mg/dL — ABNORMAL LOW (ref >39)
Total CHOL/HDL Ratio: 4.5 Ratio
Triglycerides: 419 mg/dL — ABNORMAL HIGH (ref <150)

## 2013-05-20 LAB — BASIC METABOLIC PANEL
BUN: 15 mg/dL (ref 6–23)
CHLORIDE: 97 meq/L (ref 96–112)
CO2: 28 mEq/L (ref 19–32)
CREATININE: 0.96 mg/dL (ref 0.50–1.35)
Calcium: 9.5 mg/dL (ref 8.4–10.5)
Glucose, Bld: 317 mg/dL — ABNORMAL HIGH (ref 70–99)
Potassium: 4.6 mEq/L (ref 3.5–5.3)
Sodium: 134 mEq/L — ABNORMAL LOW (ref 135–145)

## 2013-05-20 LAB — HEMOGLOBIN A1C
Hgb A1c MFr Bld: 10.8 % — ABNORMAL HIGH (ref <5.7)
Mean Plasma Glucose: 263 mg/dL — ABNORMAL HIGH (ref <117)

## 2013-05-20 LAB — TSH: TSH: 1.277 u[IU]/mL (ref 0.350–4.500)

## 2013-05-28 ENCOUNTER — Encounter: Payer: Self-pay | Admitting: Endocrinology

## 2013-07-10 ENCOUNTER — Other Ambulatory Visit: Payer: Self-pay | Admitting: Endocrinology

## 2013-07-30 ENCOUNTER — Other Ambulatory Visit: Payer: Self-pay

## 2013-07-30 MED ORDER — GLUCOSE BLOOD VI STRP: ORAL_STRIP | Status: DC

## 2013-07-30 MED ORDER — ONETOUCH ULTRA 2 W/DEVICE KIT: PACK | Status: DC

## 2013-08-21 ENCOUNTER — Ambulatory Visit: Payer: Medicare Other | Admitting: Endocrinology

## 2013-11-25 ENCOUNTER — Telehealth: Payer: Self-pay

## 2013-11-25 NOTE — Telephone Encounter (Signed)
Diabetic Bundle. Pt no longer with Dr. Everardo AllEllison.

## 2014-07-15 ENCOUNTER — Encounter: Payer: Self-pay | Admitting: Physician Assistant

## 2014-07-15 ENCOUNTER — Ambulatory Visit (INDEPENDENT_AMBULATORY_CARE_PROVIDER_SITE_OTHER): Payer: Medicare HMO | Admitting: Physician Assistant

## 2014-07-15 VITALS — BP 108/60 | HR 60 | Ht 68.0 in | Wt 235.2 lb

## 2014-07-15 DIAGNOSIS — R195 Other fecal abnormalities: Secondary | ICD-10-CM

## 2014-07-15 DIAGNOSIS — R194 Change in bowel habit: Secondary | ICD-10-CM

## 2014-07-15 MED ORDER — MOVIPREP 100 G PO SOLR: 1.0000 | Freq: Once | ORAL | Status: DC

## 2014-07-15 NOTE — Progress Notes (Signed)
Agree with initial assessment and plans as outlined 

## 2014-07-15 NOTE — Progress Notes (Signed)
Patient ID: Todd Krause, male   DOB: 1951-03-08, 64 y.o.   MRN: [id number]    HPI:  Todd Krause is a 64 y.o.   male  referred by Vicenta Aly, [place] for evaluation of heme positive stools.  Todd Krause is known to Dr. From prior colonoscopies. His last colonoscopy was performed 08/04/2003 at which time a polyp was removed. He was advised to have surveillance in 5 years. He has a history of hypercholesterolemia, adult onset diabetes, hypertension, osteoarthritis, and erectile dysfunction. He denies any bright red blood per rectum or melena. His appetite as been good and he's had no abnormal weight loss. His wife reports that for the past 6-8 months his stools have been mushy and clumpy. He has anywhere from 2-5 bowel movements a day depending on what he eats. He reports that he eats and feels very very bloated until he has several bowel movements. He has no abdominal pain. He denies complaints of epigastric pain, heartburn, dysphagia, or odynophagia.   Past Medical History  Diagnosis Date  . HYPERCHOLESTEROLEMIA 02/03/2009  . 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 HYPERTENSION 08/26/2007  . OSTEOARTHRITIS 01/24/2007  . ERECTILE DYSFUNCTION 01/24/2007    History reviewed. No pertinent past surgical history. Family History  Problem Relation Age of Onset  . Colon cancer Neg Hx   . Colon polyps Neg Hx   . Kidney disease Neg Hx   . Gallbladder disease Neg Hx   . Diabetes Mother   . Diabetes Father   . Esophageal cancer Neg Hx    History  Substance Use Topics  . Smoking status: Former Research scientist (life sciences)  . Smokeless tobacco: Never Used  . Alcohol Use: No   Current Outpatient Prescriptions  Medication Sig Dispense Refill  . albuterol (PROVENTIL HFA;VENTOLIN HFA) 108 (90 BASE) MCG/ACT inhaler Inhale 2 puffs into the lungs as needed.     Marland Kitchen albuterol (PROVENTIL) (2.5 MG/3ML) 0.083% nebulizer solution 2.5 mg as needed.     Marland Kitchen aspirin 81 MG tablet Take 81 mg by mouth daily.      . Blood Glucose  Monitoring Suppl (ONE TOUCH ULTRA 2) W/DEVICE KIT Use to check blood sugars 2/day. 1 each 0  . Cholecalciferol (VITAMIN D3) 2000 UNITS TABS Take by mouth daily.    . fenofibrate 54 MG tablet TAKE 1 TABLET EVERY DAY 90 tablet 1  . glucose blood (ONE TOUCH ULTRA TEST) test strip Use to check blood sugars 2/day. 200 each 2  . Insulin Glargine (LANTUS) 100 UNIT/ML Solostar Pen Inject 100 Units into the skin daily at 10 pm. INJECT 25 - 50 UNITS SUBCUTANEOUSLY DAILY BASED ON TITRATION SCHEDULE    . Insulin Syringe-Needle U-100 (RELION INSULIN SYR 0.5ML/31G) 31G X 5/16" 0.5 ML MISC 1 Syringe by Does not apply route 3 (three) times daily with meals. 90 each 3  . isosorbide mononitrate (IMDUR) 30 MG 24 hr tablet 1/2 tablet daily     . Liraglutide 18 MG/3ML SOPN Inject 0.6 mg once daily x 1 wk then 1.2 mg daily    . lisinopril-hydrochlorothiazide (PRINZIDE,ZESTORETIC) 20-25 MG per tablet Take 1 tablet by mouth daily.     . metoprolol succinate (TOPROL-XL) 25 MG 24 hr tablet Take 1 tablet (25 mg total) by mouth daily. 90 tablet 0  . Multiple Vitamin (MULTIVITAMIN) tablet Take 1 tablet by mouth daily.      Marland Kitchen omeprazole (PRILOSEC) 20 MG capsule Take 1 capsule (20 mg total) by mouth daily. 90 capsule 0  . pravastatin (PRAVACHOL) 40 MG  tablet Take 40 mg by mouth at bedtime.      . Red Yeast Rice 600 MG CAPS Take by mouth daily.     . tamsulosin (FLOMAX) 0.4 MG CAPS capsule Take 0.4 mg by mouth daily.     . vitamin B-12 (CYANOCOBALAMIN) 1000 MCG tablet Take 1,000 mcg by mouth daily.     Marland Kitchen MOVIPREP 100 G SOLR Take 1 kit (200 g total) by mouth once. 1 kit 0   No current facility-administered medications for this visit.   Allergies  Allergen Reactions  . Codeine   . Latex      Review of Systems: Gen: Denies any fever, chills, sweats, anorexia, fatigue, weakness, malaise, weight loss, and sleep disorder CV: Denies chest pain, angina, palpitations, syncope, orthopnea, PND, peripheral edema, and  claudication. Resp: Denies dyspnea at rest, dyspnea with exercise, cough, sputum, wheezing, coughing up blood, and pleurisy. GI: Denies vomiting blood, jaundice, and fecal incontinence.   Denies dysphagia or odynophagia. GU : Denies urinary burning, blood in urine, urinary frequency, urinary hesitancy, nocturnal urination, and urinary incontinence. MS: Denies joint pain, limitation of movement, and swelling, stiffness, low back pain, extremity pain. Denies muscle weakness, cramps, atrophy.  Derm: Denies rash, itching, dry skin, hives, moles, warts, or unhealing ulcers.  Psych: Denies depression, anxiety, memory loss, suicidal ideation, hallucinations, paranoia, and confusion. Heme: Denies bruising, bleeding, and enlarged lymph nodes. Neuro:  Denies any headaches, dizziness, paresthesias. Endo:  Denies any problems with DM, thyroid, adrenal function  Labs: CBC done 07/06/2014 at no vomiting had white blood cell count 8.7, hemoglobin 14.8, hematocrit 42.8, platelets 305,000.  Prior Endoscopies:   Colonoscopy 08/04/2003 had a diminutive polyp removed from the rectum.  Physical Exam: BP 108/60 mmHg  Pulse 60  Ht $R'5\' 8"'KF$  (1.727 m)  Wt 235 lb 4 oz (106.709 kg)  BMI 35.78 kg/m2 Constitutional: Pleasant,well-developed male in no acute distress. HEENT: Normocephalic and atraumatic. Conjunctivae are normal. No scleral icterus. Neck supple.  Cardiovascular: Normal rate, regular rhythm.  Pulmonary/chest: Effort normal and breath sounds normal. No wheezing, rales or rhonchi. Abdominal: Soft, nondistended, nontender. Bowel sounds active throughout. There are no masses palpable. No hepatomegaly. Extremities: no edema Lymphadenopathy: No cervical adenopathy noted. Neurological: Alert and oriented to person place and time. Skin: Skin is warm and dry. No rashes noted. Psychiatric: Normal mood and affect. Behavior is normal.  ASSESSMENT AND PLAN: 64 year old male with a personal history of colon polyps  recently found to have heme-positive stools referred for evaluation. He has also noted a slight change in his bowel habits over the past several months with excessive bloating and "clumpy, mushy" stools. A  pancreatic fecal elastase will be obtained along with an IgA and TTG. If his elastase is low he will be started on Creon. If his IgA and TTG are suggestive of celiac disease, he will be considered for EGD with biopsies. At this time he will be scheduled for a colonoscopy to screen for polyps, neoplasia or AVMs as a possible source of his heme positive stools.The risks, benefits, and alternatives to colonoscopy with possible biopsy and possible polypectomy were discussed with the patient and they consent to proceed. The procedure will be scheduled with Dr. Henrene Pastor. Further recommendations will be made pending the findings of the above.    Dahlton Hinde, Deloris Ping 07/15/2014, 12:48 PM  CC: Vicenta Aly, FNP

## 2014-07-15 NOTE — Patient Instructions (Signed)
You have been scheduled for a colonoscopy. Please follow written instructions given to you at your visit today.  Please pick up your prep supplies at the pharmacy within the next 1-3 days. If you use inhalers (even only as needed), please bring them with you on the day of your procedure. Your physician has requested that you go to www.startemmi.com and enter the access code given to you at your visit today. This web site gives a general overview about your procedure. However, you should still follow specific instructions given to you by our office regarding your preparation for the procedure. Your physician has requested that you go to the basement for  lab work before leaving today. CC:  Alroy DustScott Nadel MD

## 2014-07-20 ENCOUNTER — Ambulatory Visit (AMBULATORY_SURGERY_CENTER): Payer: Medicare HMO | Admitting: Internal Medicine

## 2014-07-20 ENCOUNTER — Encounter: Payer: Self-pay | Admitting: Internal Medicine

## 2014-07-20 VITALS — BP 138/75 | HR 60 | Resp 11

## 2014-07-20 DIAGNOSIS — R195 Other fecal abnormalities: Secondary | ICD-10-CM

## 2014-07-20 LAB — GLUCOSE, CAPILLARY
GLUCOSE-CAPILLARY: 118 mg/dL — AB (ref 70–99)
Glucose-Capillary: 98 mg/dL (ref 70–99)

## 2014-07-20 MED ORDER — SODIUM CHLORIDE 0.9 % IV SOLN: 500.0000 mL | INTRAVENOUS | Status: DC

## 2014-07-20 NOTE — Patient Instructions (Signed)
YOU HAD AN ENDOSCOPIC PROCEDURE TODAY AT THE [facility]:   Refer to the procedure report that was given to you for any specific questions about what was found during the examination.  If the procedure report does not answer your questions, please call your gastroenterologist to clarify.  If you requested that your care partner not be given the details of your procedure findings, then the procedure report has been included in a sealed envelope for you to review at your convenience later.  YOU SHOULD EXPECT: Some feelings of bloating in the abdomen. Passage of more gas than usual.  Walking can help get rid of the air that was put into your GI tract during the procedure and reduce the bloating. If you had a lower endoscopy (such as a colonoscopy or flexible sigmoidoscopy) you may notice spotting of blood in your stool or on the toilet paper. If you underwent a bowel prep for your procedure, you may not have a normal bowel movement for a few days.  Please Note:  You might notice some irritation and congestion in your nose or some drainage.  This is from the oxygen used during your procedure.  There is no need for concern and it should clear up in a day or so.  SYMPTOMS TO REPORT IMMEDIATELY:   Following lower endoscopy (colonoscopy or flexible sigmoidoscopy):  Excessive amounts of blood in the stool  Significant tenderness or worsening of abdominal pains  Swelling of the abdomen that is new, acute  Fever of 100F or higher   For urgent or emergent issues, a gastroenterologist can be reached at any hour by calling [phone].   DIET: Your first meal following the procedure should be a small meal and then it is ok to progress to your normal diet. Heavy or fried foods are harder to digest and may make you feel nauseous or bloated.  Likewise, meals heavy in dairy and vegetables can increase bloating.  Drink plenty of fluids but you should avoid alcoholic beverages for 24  hours.  ACTIVITY:  You should plan to take it easy for the rest of today and you should NOT DRIVE or use heavy machinery until tomorrow (because of the sedation medicines used during the test).    FOLLOW UP: Our staff will call the number listed on your records the next business day following your procedure to check on you and address any questions or concerns that you may have regarding the information given to you following your procedure. If we do not reach you, we will leave a message.  However, if you are feeling well and you are not experiencing any problems, there is no need to return our call.  We will assume that you have returned to your regular daily activities without incident.  If any biopsies were taken you will be contacted by phone or by letter within the next 1-3 weeks.  Please call us at [phone] if you have not heard about the biopsies in 3 weeks.    SIGNATURES/CONFIDENTIALITY: You and/or your care partner have signed paperwork which will be entered into your electronic medical record.  These signatures attest to the fact that that the information above on your After Visit Summary has been reviewed and is understood.  Full responsibility of the confidentiality of this discharge information lies with you and/or your care-partner.   Resume medications. 

## 2014-07-20 NOTE — Progress Notes (Signed)
Report to PACU, RN, vss, BBS= Clear.  

## 2014-07-20 NOTE — Op Note (Signed)
[facility] [address] [place], [id number]   COLONOSCOPY PROCEDURE REPORT  PATIENT: Todd Krause, Todd Krause  MR#: [id number] BIRTHDATE: [birth year] , 64  yrs. old GENDER: male ENDOSCOPIST: Roxy CedarJohn N Perry Jr, MD REFERRED AO:ZHYQMVBY:Teresa Anderson, F.N.P.-B.C. PROCEDURE DATE:  07/20/2014 PROCEDURE:   Colonoscopy, diagnostic First Screening Colonoscopy - Avg.  risk and is 50 yrs.  old or older - No.  Prior Negative Screening - Now for repeat screening. N/A  History of Adenoma - Now for follow-up colonoscopy & has been > or = to 3 yrs.  N/A ASA CLASS:   Class II INDICATIONS:Evaluation of unexplained GI bleeding and Patient is not applicable for Colorectal Neoplasm Risk Assessment for this procedure.   Had colonoscopy 07-2003 (small rectal polyp ? path) MEDICATIONS: Monitored anesthesia care and Propofol 200 mg IV  DESCRIPTION OF PROCEDURE:   After the risks benefits and alternatives of the procedure were thoroughly explained, informed consent was obtained.  The digital rectal exam revealed no abnormalities of the rectum.   The LB [id number]  endoscope was introduced through the anus and advanced to the cecum, which was identified by both the appendix and ileocecal valve. No adverse events experienced.   The quality of the prep was excellent. (MoviPrep was used)  The instrument was then slowly withdrawn as the colon was fully examined.      COLON FINDINGS: A normal appearing cecum, ileocecal valve, and appendiceal orifice were identified.  The ascending, transverse, descending, sigmoid colon, and rectum appeared unremarkable. Retroflexed views revealed internal hemorrhoids. The time to cecum = 2.0 Withdrawal time = 11.2   The scope was withdrawn and the procedure completed.  COMPLICATIONS: There were no immediate complications.  ENDOSCOPIC IMPRESSION: Normal colonoscopy  RECOMMENDATIONS: Continue current colorectal screening recommendations for  "routine risk" patients with a repeat colonoscopy in 10 years.  eSigned:  Roxy CedarJohn N Perry Jr, MD 07/20/2014 10:25 AM   cc: Elizabeth Palaueresa Anderson, F.N.P.-B.C and The Patient

## 2014-07-21 ENCOUNTER — Telehealth: Payer: Self-pay | Admitting: *Deleted

## 2014-07-21 NOTE — Telephone Encounter (Signed)
  Follow up Call-  Call back number 07/20/2014  Post procedure Call Back phone  # [phone]  Permission to leave phone message Yes     Patient questions:  Do you have a fever, pain , or abdominal swelling? No. Pain Score  0 *  Have you tolerated food without any problems? Yes.    Have you been able to return to your normal activities? Yes.    Do you have any questions about your discharge instructions: Diet   No. Medications  No. Follow up visit  No.  Do you have questions or concerns about your Care? No.  Actions: * If pain score is 4 or above: No action needed, pain <4.

## 2014-08-25 DIAGNOSIS — R972 Elevated prostate specific antigen [PSA]: Secondary | ICD-10-CM | POA: Insufficient documentation

## 2014-09-02 ENCOUNTER — Other Ambulatory Visit: Payer: Self-pay | Admitting: Urology

## 2014-09-23 ENCOUNTER — Encounter (HOSPITAL_BASED_OUTPATIENT_CLINIC_OR_DEPARTMENT_OTHER): Payer: Self-pay | Admitting: *Deleted

## 2014-09-23 NOTE — Progress Notes (Signed)
NPO AFTER MN.  ARRIVE AT 0915.  NEEDS ISTAT.  CURRENT EKG TO BE FAXED FROM PT'S PCP.  WILL DO FLEET ENEMA AM DOS.

## 2014-09-29 NOTE — Anesthesia Preprocedure Evaluation (Addendum)
Anesthesia Evaluation  Patient identified by MRN, date of birth, ID band Patient awake    Reviewed: Allergy & Precautions, NPO status , Patient's Chart, lab work & pertinent test results  Airway Mallampati: III   Neck ROM: Full    Dental  (+) Dental Advisory Given, Teeth Intact   Pulmonary sleep apnea , former smoker (quit 2008, 60 pack year),  breath sounds clear to auscultation        Cardiovascular hypertension, Pt. on medications Rhythm:Regular  ECHO 2011 EF 60%, mild MR and AR, grade 1 diastolic dys   Neuro/Psych    GI/Hepatic Neg liver ROS, GERD-  Medicated,  Endo/Other  diabetes, Type 2  Renal/GU      Musculoskeletal   Abdominal (+)  Abdomen: soft.    Peds  Hematology   Anesthesia Other Findings   Reproductive/Obstetrics                            Anesthesia Physical Anesthesia Plan  ASA: III  Anesthesia Plan: MAC   Post-op Pain Management:    Induction: Intravenous  Airway Management Planned: LMA and Oral ETT  Additional Equipment:   Intra-op Plan:   Post-operative Plan:   Informed Consent: I have reviewed the patients History and Physical, chart, labs and discussed the procedure including the risks, benefits and alternatives for the proposed anesthesia with the patient or authorized representative who has indicated his/her understanding and acceptance.     Plan Discussed with:   Anesthesia Plan Comments:        Anesthesia Quick Evaluation

## 2014-10-01 ENCOUNTER — Ambulatory Visit (HOSPITAL_BASED_OUTPATIENT_CLINIC_OR_DEPARTMENT_OTHER)
Admission: RE | Admit: 2014-10-01 | Discharge: 2014-10-01 | Disposition: A | Discharge status: Home / Self Care | Payer: Medicare HMO | Source: Ambulatory Visit | Attending: Urology | Admitting: Urology

## 2014-10-01 ENCOUNTER — Ambulatory Visit (HOSPITAL_BASED_OUTPATIENT_CLINIC_OR_DEPARTMENT_OTHER): Payer: Medicare HMO | Admitting: Anesthesiology

## 2014-10-01 ENCOUNTER — Encounter (HOSPITAL_BASED_OUTPATIENT_CLINIC_OR_DEPARTMENT_OTHER)
Admission: RE | Disposition: A | Discharge status: Home / Self Care | Payer: Self-pay | Source: Ambulatory Visit | Attending: Urology

## 2014-10-01 ENCOUNTER — Encounter (HOSPITAL_BASED_OUTPATIENT_CLINIC_OR_DEPARTMENT_OTHER): Payer: Self-pay | Admitting: Anesthesiology

## 2014-10-01 DIAGNOSIS — Z794 Long term (current) use of insulin: Secondary | ICD-10-CM | POA: Diagnosis not present

## 2014-10-01 DIAGNOSIS — R3915 Urgency of urination: Secondary | ICD-10-CM | POA: Insufficient documentation

## 2014-10-01 DIAGNOSIS — R972 Elevated prostate specific antigen [PSA]: Secondary | ICD-10-CM | POA: Diagnosis not present

## 2014-10-01 DIAGNOSIS — Z85820 Personal history of malignant melanoma of skin: Secondary | ICD-10-CM | POA: Diagnosis not present

## 2014-10-01 DIAGNOSIS — Z79899 Other long term (current) drug therapy: Secondary | ICD-10-CM | POA: Insufficient documentation

## 2014-10-01 DIAGNOSIS — N4289 Other specified disorders of prostate: Secondary | ICD-10-CM | POA: Insufficient documentation

## 2014-10-01 DIAGNOSIS — K759 Inflammatory liver disease, unspecified: Secondary | ICD-10-CM | POA: Insufficient documentation

## 2014-10-01 DIAGNOSIS — Z87891 Personal history of nicotine dependence: Secondary | ICD-10-CM | POA: Diagnosis not present

## 2014-10-01 DIAGNOSIS — R350 Frequency of micturition: Secondary | ICD-10-CM | POA: Diagnosis not present

## 2014-10-01 DIAGNOSIS — N419 Inflammatory disease of prostate, unspecified: Secondary | ICD-10-CM | POA: Diagnosis not present

## 2014-10-01 DIAGNOSIS — R3914 Feeling of incomplete bladder emptying: Secondary | ICD-10-CM | POA: Insufficient documentation

## 2014-10-01 DIAGNOSIS — N403 Nodular prostate with lower urinary tract symptoms: Secondary | ICD-10-CM | POA: Insufficient documentation

## 2014-10-01 DIAGNOSIS — I10 Essential (primary) hypertension: Secondary | ICD-10-CM | POA: Diagnosis not present

## 2014-10-01 DIAGNOSIS — M1990 Unspecified osteoarthritis, unspecified site: Secondary | ICD-10-CM | POA: Diagnosis not present

## 2014-10-01 DIAGNOSIS — Z7982 Long term (current) use of aspirin: Secondary | ICD-10-CM | POA: Insufficient documentation

## 2014-10-01 DIAGNOSIS — R351 Nocturia: Secondary | ICD-10-CM | POA: Diagnosis not present

## 2014-10-01 HISTORY — DX: Hyperlipidemia, unspecified: E78.5

## 2014-10-01 HISTORY — DX: Type 2 diabetes mellitus without complications: E11.9

## 2014-10-01 HISTORY — DX: Obstructive sleep apnea (adult) (pediatric): G47.33

## 2014-10-01 HISTORY — DX: Nodular prostate without lower urinary tract symptoms: N40.2

## 2014-10-01 HISTORY — DX: Unspecified symptoms and signs involving the genitourinary system: R39.9

## 2014-10-01 HISTORY — DX: Gastro-esophageal reflux disease without esophagitis: K21.9

## 2014-10-01 HISTORY — DX: Benign prostatic hyperplasia without lower urinary tract symptoms: N40.0

## 2014-10-01 HISTORY — DX: Personal history of colon polyps, unspecified: Z86.0100

## 2014-10-01 HISTORY — PX: PROSTATE BIOPSY: SHX241

## 2014-10-01 HISTORY — DX: Elevated prostate specific antigen (PSA): R97.20

## 2014-10-01 HISTORY — DX: Unspecified osteoarthritis, unspecified site: M19.90

## 2014-10-01 HISTORY — DX: Essential (primary) hypertension: I10

## 2014-10-01 HISTORY — DX: Male erectile dysfunction, unspecified: N52.9

## 2014-10-01 HISTORY — DX: Personal history of colonic polyps: Z86.010

## 2014-10-01 LAB — GLUCOSE, CAPILLARY
GLUCOSE-CAPILLARY: 143 mg/dL — AB (ref 65–99)
Glucose-Capillary: 143 mg/dL — ABNORMAL HIGH (ref 65–99)

## 2014-10-01 LAB — POCT I-STAT, CHEM 8
BUN: 17 mg/dL (ref 6–20)
CALCIUM ION: 1.26 mmol/L (ref 1.13–1.30)
CHLORIDE: 101 mmol/L (ref 101–111)
Creatinine, Ser: 0.8 mg/dL (ref 0.61–1.24)
Glucose, Bld: 150 mg/dL — ABNORMAL HIGH (ref 65–99)
HCT: 45 % (ref 39.0–52.0)
Hemoglobin: 15.3 g/dL (ref 13.0–17.0)
Potassium: 4.2 mmol/L (ref 3.5–5.1)
Sodium: 139 mmol/L (ref 135–145)
TCO2: 22 mmol/L (ref 0–100)

## 2014-10-01 SURGERY — BIOPSY, PROSTATE, RECTAL APPROACH, WITH US GUIDANCE
Anesthesia: Monitor Anesthesia Care | Site: Prostate

## 2014-10-01 MED ORDER — MIDAZOLAM HCL 2 MG/2ML IJ SOLN
INTRAMUSCULAR | Status: AC
  Filled 2014-10-01: qty 2

## 2014-10-01 MED ORDER — FENTANYL CITRATE (PF) 100 MCG/2ML IJ SOLN
INTRAMUSCULAR | Status: AC
  Filled 2014-10-01: qty 4

## 2014-10-01 MED ORDER — PROMETHAZINE HCL 25 MG/ML IJ SOLN
6.2500 mg | INTRAMUSCULAR | Status: DC | PRN
  Filled 2014-10-01: qty 1

## 2014-10-01 MED ORDER — MIDAZOLAM HCL 5 MG/5ML IJ SOLN
INTRAMUSCULAR | Status: DC | PRN
  Administered 2014-10-01: 1 mg via INTRAVENOUS

## 2014-10-01 MED ORDER — LIDOCAINE HCL 2 % EX GEL
CUTANEOUS | Status: DC | PRN
  Administered 2014-10-01: 1 via TOPICAL

## 2014-10-01 MED ORDER — METOPROLOL TARTRATE 25 MG PO TABS
ORAL_TABLET | ORAL | Status: AC
  Filled 2014-10-01: qty 1

## 2014-10-01 MED ORDER — FENTANYL CITRATE (PF) 100 MCG/2ML IJ SOLN
25.0000 ug | INTRAMUSCULAR | Status: DC | PRN
  Filled 2014-10-01: qty 1

## 2014-10-01 MED ORDER — MEPERIDINE HCL 25 MG/ML IJ SOLN
6.2500 mg | INTRAMUSCULAR | Status: DC | PRN
  Filled 2014-10-01: qty 1

## 2014-10-01 MED ORDER — CIPROFLOXACIN IN D5W 400 MG/200ML IV SOLN
400.0000 mg | INTRAVENOUS | Status: AC
  Administered 2014-10-01: 400 mg via INTRAVENOUS
  Filled 2014-10-01: qty 200

## 2014-10-01 MED ORDER — FENTANYL CITRATE (PF) 100 MCG/2ML IJ SOLN
INTRAMUSCULAR | Status: DC | PRN
  Administered 2014-10-01: 50 ug via INTRAVENOUS
  Administered 2014-10-01: 25 ug via INTRAVENOUS

## 2014-10-01 MED ORDER — METOPROLOL TARTRATE 25 MG PO TABS
25.0000 mg | ORAL_TABLET | Freq: Once | ORAL | Status: AC
  Administered 2014-10-01: 25 mg via ORAL
  Filled 2014-10-01: qty 1

## 2014-10-01 MED ORDER — CIPROFLOXACIN IN D5W 400 MG/200ML IV SOLN
INTRAVENOUS | Status: AC
  Filled 2014-10-01: qty 200

## 2014-10-01 MED ORDER — LIDOCAINE HCL 2 % IJ SOLN
INTRAMUSCULAR | Status: DC | PRN
  Administered 2014-10-01: 10 mL

## 2014-10-01 MED ORDER — PROPOFOL 500 MG/50ML IV EMUL
INTRAVENOUS | Status: DC | PRN
  Administered 2014-10-01: 180 ug/kg/min via INTRAVENOUS

## 2014-10-01 MED ORDER — LACTATED RINGERS IV SOLN
INTRAVENOUS | Status: DC
  Administered 2014-10-01: 10:00:00 via INTRAVENOUS
  Filled 2014-10-01: qty 1000

## 2014-10-01 MED ORDER — ONDANSETRON HCL 4 MG/2ML IJ SOLN
INTRAMUSCULAR | Status: DC | PRN
  Administered 2014-10-01: 4 mg via INTRAVENOUS

## 2014-10-01 MED ORDER — LIDOCAINE HCL (CARDIAC) 20 MG/ML IV SOLN
INTRAVENOUS | Status: DC | PRN
  Administered 2014-10-01: 50 mg via INTRAVENOUS

## 2014-10-01 SURGICAL SUPPLY — 13 items
DRSG TELFA 3X8 NADH (GAUZE/BANDAGES/DRESSINGS) IMPLANT
GLOVE BIO SURGEON STRL SZ7.5 (GLOVE) IMPLANT
GLOVE SURG SS PI 8.0 STRL IVOR (GLOVE) ×2 IMPLANT
IV NS IRRIG 3000ML ARTHROMATIC (IV SOLUTION) IMPLANT
NDL SAFETY ECLIPSE 18X1.5 (NEEDLE) ×1 IMPLANT
NEEDLE HYPO 18GX1.5 SHARP (NEEDLE) ×1
NEEDLE SPNL 22GX7 QUINCKE BK (NEEDLE) ×2 IMPLANT
SURGILUBE 2OZ TUBE FLIPTOP (MISCELLANEOUS) ×2 IMPLANT
SYR CONTROL 10ML LL (SYRINGE) ×2 IMPLANT
TOWEL OR 17X24 6PK STRL BLUE (TOWEL DISPOSABLE) IMPLANT
UNDERPAD 30X30 INCONTINENT (UNDERPADS AND DIAPERS) ×4 IMPLANT
WATER STERILE IRR 3000ML UROMA (IV SOLUTION) IMPLANT
WATER STERILE IRR 500ML POUR (IV SOLUTION) IMPLANT

## 2014-10-01 NOTE — Anesthesia Postprocedure Evaluation (Signed)
  Anesthesia Post-op Note  Patient: Todd MainlandChristopher M Zorn  Procedure(s) Performed: Procedure(s): BIOPSY TRANSRECTAL ULTRASONIC PROSTATE (TUBP) (N/A)  Patient Location: PACU  Anesthesia Type:MAC  Level of Consciousness: awake and alert   Airway and Oxygen Therapy: Patient Spontanous Breathing  Post-op Pain: mild  Post-op Assessment: Post-op Vital signs reviewed, Patient's Cardiovascular Status Stable, Respiratory Function Stable, Patent Airway and No signs of Nausea or vomiting  Post-op Vital Signs: Reviewed and stable  Last Vitals:  Filed Vitals:   10/01/14 1145  BP: 138/75  Pulse: 63  Temp:   Resp: 14    Complications: No apparent anesthesia complications

## 2014-10-01 NOTE — Anesthesia Procedure Notes (Signed)
Procedure Name: MAC Date/Time: 10/01/2014 11:10 AM Performed by: Tyrone NineSAUVE, Davell Beckstead F Pre-anesthesia Checklist: Patient identified, Timeout performed, Emergency Drugs available, Suction available and Patient being monitored Patient Re-evaluated:Patient Re-evaluated prior to inductionOxygen Delivery Method: Nasal cannula Preoxygenation: Pre-oxygenation with 100% oxygen Intubation Type: IV induction Placement Confirmation: positive ETCO2 Dental Injury: Teeth and Oropharynx as per pre-operative assessment

## 2014-10-01 NOTE — Discharge Instructions (Addendum)
Transrectal Ultrasound-Guided Biopsy A transrectal ultrasound-guided biopsy is a procedure to remove samples of tissue from your prostate using ultrasound images to guide the procedure. The procedure is usually done to evaluate the prostate gland of men who have an elevated prostate-specific antigen (PSA). PSA is a blood test to screen for prostate cancer. The biopsy samples are taken to check for prostate cancer.  LET [facility] CARE PROVIDER KNOW ABOUT:  Any allergies you have.  All medicines you are taking, including vitamins, herbs, eye drops, creams, and over-the-counter medicines.  Previous problems you or members of your family have had with the use of anesthetics.  Any blood disorders you have.  Previous surgeries you have had.  Medical conditions you have. RISKS AND COMPLICATIONS Generally, this is a safe procedure. However, as with any procedure, problems can occur. Possible problems include:  Infection of your prostate.  Bleeding from your rectum or blood in your urine.  Difficulty urinating.  Nerve damage (this is usually temporary).  Damage to surrounding structures such as blood vessels, organs, and muscles, which would require other procedures. BEFORE THE PROCEDURE  Do not eat or drink anything after midnight on the night before the procedure or as directed by your health care provider.  Take medicines only as directed by your health care provider.  Your health care provider may have you stop taking certain medicines 5-7 days before the procedure.  You will be given an enema before the procedure. During an enema, a liquid is injected into your rectum to clear out waste.  You may have lab tests the day of your procedure.   Plan to have someone take you home after the procedure. PROCEDURE   You will be given medicine to help you relax (sedative) before the procedure. An IV tube will be inserted into one of your veins and used to give fluids and  medicine.  You will be given antibiotic medicine to reduce the risk of an infection.  You will be placed on your side for the procedure.  A probe with lubricated gel will be placed into your rectum, and images will be taken of your prostate and surrounding structures.  Numbing medicine will be injected into the prostate before the biopsy samples are taken.  A biopsy needle will then be inserted and guided to your prostate with the use of the ultrasound images.  Samples of prostate tissue will be taken, and the needle will then be removed.  The biopsy samples will be sent to a lab to be analyzed. Results are usually back in 2-3 days. AFTER THE PROCEDURE  You will be taken to a recovery area where you will be monitored.  You may have some discomfort in the rectal area. You will be given pain medicines to control this.  You may be allowed to go home the same day, or you may need to stay in the hospital overnight. Document Released: 08/31/2013 Document Reviewed: 12/03/2012 [facility] Patient Information 2015 [place], [place]. This information is not intended to replace advice given to you by your health care provider. Make sure you discuss any questions you have with your health care provider.    Call your surgeon if you experience:   1.  Fever over 101.0. 2.  Inability to urinate. 3.  Nausea and/or vomiting. 4.  Extreme swelling or bruising at the surgical site. 5.  Continued bleeding from the incision. 6.  Increased pain, redness or drainage from the incision. 7.  Problems related to your pain medication.  8. Any change in color, movement and/or sensation 9. Any problems and/or concerns    Post Anesthesia Home Care Instructions  Activity: Get plenty of rest for the remainder of the day. A responsible adult should stay with you for 24 hours following the procedure.  For the next 24 hours, DO NOT: -Drive a car -Advertising copywriterperate machinery -Drink alcoholic beverages -Take any medication  unless instructed by your physician -Make any legal decisions or sign important papers.  Meals: Start with liquid foods such as gelatin or soup. Progress to regular foods as tolerated. Avoid greasy, spicy, heavy foods. If nausea and/or vomiting occur, drink only clear liquids until the nausea and/or vomiting subsides. Call your physician if vomiting continues.  Special Instructions/Symptoms: Your throat may feel dry or sore from the anesthesia or the breathing tube placed in your throat during surgery. If this causes discomfort, gargle with warm salt water. The discomfort should disappear within 24 hours.  If you had a scopolamine patch placed behind your ear for the management of post- operative nausea and/or vomiting:  1. The medication in the patch is effective for 72 hours, after which it should be removed.  Wrap patch in a tissue and discard in the trash. Wash hands thoroughly with soap and water. 2. You may remove the patch earlier than 72 hours if you experience unpleasant side effects which may include dry mouth, dizziness or visual disturbances. 3. Avoid touching the patch. Wash your hands with soap and water after contact with the patch.

## 2014-10-01 NOTE — Transfer of Care (Signed)
Immediate Anesthesia Transfer of Care Note  Patient: Todd Krause  Procedure(s) Performed: Procedure(s): BIOPSY TRANSRECTAL ULTRASONIC PROSTATE (TUBP) (N/A)  Patient Location: PACU  Anesthesia Type:MAC  Level of Consciousness: awake, alert , oriented and patient cooperative  Airway & Oxygen Therapy: Patient Spontanous Breathing and Patient connected to nasal cannula oxygen  Post-op Assessment: Report given to RN and Post -op Vital signs reviewed and stable  Post vital signs: Reviewed and stable  Last Vitals:  Filed Vitals:   10/01/14 0942  BP: 143/73  Pulse: 75  Temp: 36.6 C  Resp: 16    Complications: No apparent anesthesia complications

## 2014-10-01 NOTE — Interval H&P Note (Signed)
History and Physical Interval Note:  10/01/2014 10:56 AM  Todd Krause  has presented today for surgery, with the diagnosis of ELEVATED PROSTATIC SPECIFIC ANTIGEN  The various methods of treatment have been discussed with the patient and family. After consideration of risks, benefits and other options for treatment, the patient has consented to  Procedure(s): BIOPSY TRANSRECTAL ULTRASONIC PROSTATE (TUBP) (N/A) as a surgical intervention .  The patient's history has been reviewed, patient examined, no change in status, stable for surgery.  I have reviewed the patient's chart and labs.  Questions were answered to the patient's satisfaction.     Milburn Freeney I Geovonni Meyerhoff

## 2014-10-01 NOTE — Op Note (Signed)
Pre-operative diagnosis :  PSA Elevation, Left prostatic nodule  Postoperative diagnosis:  Same  Operation:  Transrectal ultrasonography and transrectal biopsy of the prostate (12)'s is  Surgeon:  S. Patsi Searsannenbaum, MD  First assistant: None    X-Ray Technologist: Babette Relicammy, RT  Anesthesia:  IV Sedation ( Propofol)  Preparation:  After appropriate preanesthesia, the patient was brought the operating room, placed on the operating table in the dorsal supine position where IV Propofol sedation anesthesia technique was introduced. Using replaced in the dorsal lithotomy position with the pubis was prepped with Betadine solution and draped in usual fashion. The history was reviewed.  Review history:  History of Present Illness 64 yo Type II diabetic male referred by Elizabeth Palaueresa Anderson, NP for further evaluation of a Rt sided prostate nodule & an elevated PSA of 4.5 on 07/06/14. IPSS= 21, despite Flomax, with sense of incomplete emptying, 4/5, and urinary frequency, 4/5; and urinary urgency, 4/5; and nocturia 3/5.   Past Medical History Problems  1. History of arthritis (Z87.39) 2. History of diabetes mellitus (Z86.39) 3. History of heartburn (Z87.898) 4. History of hepatitis (Z86.19) 5. History of hypertension (Z86.79) 6. History of malignant melanoma (J19.147(Z85.820)   Statement of  Likelihood of Success: Excellent. TIME-OUT observed.:  Procedure: Following an as second induction, the patient was replaced in the left lateral decubitus position.    Rectal examination shows 4+ prostate, which is lobular. This is suggestion of the left os tag nodule, but on examination with anesthesia, is felt that the left and right posterior lobes of the prostate are lobular, without hard nodules.  The prostate is then ultrasounded, and is noted to be 72.58 mL by volume. Prostate length is 5.71 prostate height is 4.74 and prostate width is 5.1 to centimeters.  The patient is noted to have a median lobe extension into  the bladder.  Biopsies are taken in standard fashion, with 6 biopsies from each side of the prostate, includin (medial and lateral; mid prostate (medial and lateral); and apex(medial and lateral).  No bleeding was noted. The patient tolerated procedure well. He was replaced in the supine position. He was awakened and taken to recovery room in good condition.

## 2014-10-01 NOTE — H&P (Signed)
Reason For Visit Prostate nodule & elevated PSA   Active Problems Problems  1. Elevated prostate specific antigen (PSA) (R97.2)  History of Present Illness 64 yo Type II diabetic male referred by Elizabeth Palaueresa Anderson, NP for further evaluation of a Rt sided prostate nodule & an elevated PSA of 4.5 on 07/06/14. IPSS= 21, despite Flomax, with sense of incomplete emptying, 4/5, and urinary frequency, 4/5; and urinary urgency, 4/5; and nocturia 3/5.   Past Medical History Problems  1. History of arthritis (Z87.39) 2. History of diabetes mellitus (Z86.39) 3. History of heartburn (Z87.898) 4. History of hepatitis (Z86.19) 5. History of hypertension (Z86.79) 6. History of malignant melanoma (Z61.096(Z85.820)  Surgical History Problems  1. History of Biopsy Skin 2. History of Ear Surgery  Current Meds 1. Albuterol Sulfate HFA 108 MCG/ACT AERS;  Therapy: (Recorded:26Apr2016) to Recorded 2. Aspirin 81 MG Oral Tablet Chewable;  Therapy: (Recorded:26Apr2016) to Recorded 3. Fenofibrate 54 MG Oral Tablet;  Therapy: (Recorded:26Apr2016) to Recorded 4. Lantus 100 UNIT/ML Subcutaneous Solution;  Therapy: (Recorded:26Apr2016) to Recorded 5. Lisinopril-Hydrochlorothiazide 20-25 MG Oral Tablet;  Therapy: (Recorded:26Apr2016) to Recorded 6. MetFORMIN HCl - 1000 MG Oral Tablet;  Therapy: (Recorded:26Apr2016) to Recorded 7. Metoprolol Succinate ER 25 MG Oral Tablet Extended Release 24 Hour;  Therapy: (Recorded:26Apr2016) to Recorded 8. Omeprazole 20 MG Oral Capsule Delayed Release;  Therapy: (Recorded:26Apr2016) to Recorded 9. Red Yeast Rice 600 MG Oral Capsule;  Therapy: (Recorded:26Apr2016) to Recorded 10. Tamsulosin HCl - 0.4 MG Oral Capsule;   Therapy: (Recorded:26Apr2016) to Recorded 11. Victoza 18 MG/3ML SOLN;   Therapy: (Recorded:26Apr2016) to Recorded  Allergies Medication  1. Codeine Derivatives Non-Medication  2. Latex  Family History Problems  1. Family history of diabetes mellitus  (Z83.3) : Mother 2. Family history of myocardial infarction (Z82.49) : Father  Social History Problems    Denied: History of Alcohol use   Denied: History of Caffeine use   Father deceased   dec heart attack   Former smoker [phone](Z87.891)   quit 8 years ago, smoked 2 ppd x 40 years   Married   Mother deceased   dec age 66 yo uterine cancer   Number of children   2 sons ages 66yo, 66 yo   Occupation   part time courier for parts  Review of Systems Genitourinary, constitutional, skin, eye, otolaryngeal, hematologic/lymphatic, cardiovascular, pulmonary, endocrine, musculoskeletal, gastrointestinal, neurological and psychiatric system(s) were reviewed and pertinent findings if present are noted and are otherwise negative.  Genitourinary: urinary frequency, feelings of urinary urgency, nocturia, difficulty starting the urinary stream and initiating urination requires straining.    Vitals Vital Signs [Data Includes: Last 1 Day]  Recorded: 26Apr2016 11:35AM  Height: 5 ft 7 in Weight: 225 lb  BMI Calculated: 35.24 BSA Calculated: 2.13 Blood Pressure: 119 / 57 Temperature: 98 F Heart Rate: 73  Physical Exam Constitutional: Well nourished and well developed . No acute distress.  ENT:. The ears and nose are normal in appearance.  Neck: The appearance of the neck is normal and no neck mass is present.  Pulmonary: No respiratory distress and normal respiratory rhythm and effort.  Cardiovascular: Heart rate and rhythm are normal . No peripheral edema.  Abdomen: The abdomen is soft and nontender. No masses are palpated. No CVA tenderness. No hernias are palpable. No hepatosplenomegaly noted.  Rectal: Rectal exam demonstrates normal sphincter tone. Estimated prostate size is 4+. The left seminal vesicle is nonpalpable. The right seminal vesicle is nonpalpable.  Genitourinary: The penis is circumcised.  Lymphatics: The femoral and  inguinal nodes are not enlarged or tender.  Skin:  Normal skin turgor, no visible rash and no visible skin lesions.  Neuro/Psych:. Mood and affect are appropriate.    Results/Data Urine [Data Includes: Last 1 Day]   26Apr2016  COLOR YELLOW   APPEARANCE CLEAR   SPECIFIC GRAVITY 1.020   pH 6.5   GLUCOSE NEG mg/dL  BILIRUBIN NEG   KETONE NEG mg/dL  BLOOD NEG   PROTEIN NEG mg/dL  UROBILINOGEN 0.2 mg/dL  NITRITE NEG   LEUKOCYTE ESTERASE NEG    Assessment Assessed  1. Elevated prostate specific antigen (PSA) (R97.2) 2. Benign localized hyperplasia of prostate with urinary obstruction and lower urinary tract  symptoms (N40.1,N13.9)  64 yo Type II diabetic with normal u/a, with out glycosuria today, but with significant bladder outlet symptoms, with IPSS=21, despite Flomax. He has lost wt from 240 to 225 lbs. PSA, however, is 4.5;but I am not able to feel nodule today ( pt unable to bend over enough for exam).   Plan Benign localized hyperplasia of prostate with urinary obstruction and lower urinary tract symptoms, Elevated prostate specific antigen (PSA)  1. PSA REFLEX TO FREE; Status:Hold For - Specimen/Data Collection,Appointment;  Requested for:26Apr2016;  2. Follow-up Month x 1 Office  Follow-up  Status: Hold For - Appointment,Date of Service   Requested for: 26Apr2016 Health Maintenance  3. UA With REFLEX; [Do Not Release]; Status:Resulted - Requires Verification;   Done:  26Apr2016 11:15AM  PSA today. PSA-2. If abnormal, then will RTC for bx. If psa is normal, he will RTC 1 month for BPH re-evaluation   Discussion/Summary cc: Elizabeth Palau, NP, [facility]     Signatures Electronically signed by : Jethro Bolus, M.D.; Aug 24 2014 12:07PM EST

## 2014-10-04 ENCOUNTER — Encounter (HOSPITAL_BASED_OUTPATIENT_CLINIC_OR_DEPARTMENT_OTHER): Payer: Self-pay | Admitting: Urology

## 2014-10-12 ENCOUNTER — Other Ambulatory Visit: Payer: Self-pay | Admitting: Endocrinology

## 2015-01-08 ENCOUNTER — Inpatient Hospital Stay (HOSPITAL_COMMUNITY)
Admission: EM | Admit: 2015-01-08 | Discharge: 2015-01-11 | DRG: 392 | Disposition: A | Discharge status: Home / Self Care | Payer: Medicare HMO | Attending: Internal Medicine | Admitting: Internal Medicine

## 2015-01-08 ENCOUNTER — Encounter (HOSPITAL_COMMUNITY): Payer: Self-pay

## 2015-01-08 DIAGNOSIS — E119 Type 2 diabetes mellitus without complications: Secondary | ICD-10-CM

## 2015-01-08 DIAGNOSIS — E7800 Pure hypercholesterolemia, unspecified: Secondary | ICD-10-CM | POA: Diagnosis present

## 2015-01-08 DIAGNOSIS — Z87891 Personal history of nicotine dependence: Secondary | ICD-10-CM | POA: Diagnosis not present

## 2015-01-08 DIAGNOSIS — N2889 Other specified disorders of kidney and ureter: Secondary | ICD-10-CM | POA: Diagnosis not present

## 2015-01-08 DIAGNOSIS — I10 Essential (primary) hypertension: Secondary | ICD-10-CM | POA: Diagnosis not present

## 2015-01-08 DIAGNOSIS — A059 Bacterial foodborne intoxication, unspecified: Secondary | ICD-10-CM | POA: Diagnosis present

## 2015-01-08 DIAGNOSIS — I959 Hypotension, unspecified: Secondary | ICD-10-CM | POA: Diagnosis present

## 2015-01-08 DIAGNOSIS — E871 Hypo-osmolality and hyponatremia: Secondary | ICD-10-CM | POA: Diagnosis present

## 2015-01-08 DIAGNOSIS — Z7982 Long term (current) use of aspirin: Secondary | ICD-10-CM

## 2015-01-08 DIAGNOSIS — R197 Diarrhea, unspecified: Secondary | ICD-10-CM | POA: Diagnosis not present

## 2015-01-08 DIAGNOSIS — N179 Acute kidney failure, unspecified: Secondary | ICD-10-CM | POA: Diagnosis not present

## 2015-01-08 DIAGNOSIS — E860 Dehydration: Secondary | ICD-10-CM | POA: Diagnosis present

## 2015-01-08 DIAGNOSIS — E118 Type 2 diabetes mellitus with unspecified complications: Secondary | ICD-10-CM | POA: Diagnosis not present

## 2015-01-08 DIAGNOSIS — R338 Other retention of urine: Secondary | ICD-10-CM | POA: Diagnosis not present

## 2015-01-08 DIAGNOSIS — Z833 Family history of diabetes mellitus: Secondary | ICD-10-CM

## 2015-01-08 DIAGNOSIS — N401 Enlarged prostate with lower urinary tract symptoms: Secondary | ICD-10-CM | POA: Diagnosis present

## 2015-01-08 DIAGNOSIS — K219 Gastro-esophageal reflux disease without esophagitis: Secondary | ICD-10-CM | POA: Diagnosis present

## 2015-01-08 DIAGNOSIS — Z794 Long term (current) use of insulin: Secondary | ICD-10-CM

## 2015-01-08 DIAGNOSIS — Z79899 Other long term (current) drug therapy: Secondary | ICD-10-CM | POA: Diagnosis not present

## 2015-01-08 DIAGNOSIS — E785 Hyperlipidemia, unspecified: Secondary | ICD-10-CM | POA: Diagnosis present

## 2015-01-08 LAB — COMPREHENSIVE METABOLIC PANEL
ALT: 23 U/L (ref 17–63)
AST: 18 U/L (ref 15–41)
Albumin: 3.3 g/dL — ABNORMAL LOW (ref 3.5–5.0)
Alkaline Phosphatase: 77 U/L (ref 38–126)
Anion gap: 10 (ref 5–15)
BUN: 37 mg/dL — ABNORMAL HIGH (ref 6–20)
CO2: 23 mmol/L (ref 22–32)
CREATININE: 1.9 mg/dL — AB (ref 0.61–1.24)
Calcium: 8.1 mg/dL — ABNORMAL LOW (ref 8.9–10.3)
Chloride: 99 mmol/L — ABNORMAL LOW (ref 101–111)
GFR, EST AFRICAN AMERICAN: 41 mL/min — AB (ref >60)
GFR, EST NON AFRICAN AMERICAN: 35 mL/min — AB (ref >60)
Glucose, Bld: 273 mg/dL — ABNORMAL HIGH (ref 65–99)
Potassium: 4.5 mmol/L (ref 3.5–5.1)
SODIUM: 132 mmol/L — AB (ref 135–145)
Total Bilirubin: 1 mg/dL (ref 0.3–1.2)
Total Protein: 6 g/dL — ABNORMAL LOW (ref 6.5–8.1)

## 2015-01-08 LAB — CBC
HCT: 46.6 % (ref 39.0–52.0)
Hemoglobin: 15.9 g/dL (ref 13.0–17.0)
MCH: 29.5 pg (ref 26.0–34.0)
MCHC: 34.1 g/dL (ref 30.0–36.0)
MCV: 86.5 fL (ref 78.0–100.0)
Platelets: 280 10*3/uL (ref 150–400)
RBC: 5.39 MIL/uL (ref 4.22–5.81)
RDW: 13 % (ref 11.5–15.5)
WBC: 9.8 10*3/uL (ref 4.0–10.5)

## 2015-01-08 LAB — GLUCOSE, CAPILLARY
GLUCOSE-CAPILLARY: 124 mg/dL — AB (ref 65–99)
Glucose-Capillary: 145 mg/dL — ABNORMAL HIGH (ref 65–99)

## 2015-01-08 LAB — URINALYSIS, ROUTINE W REFLEX MICROSCOPIC
GLUCOSE, UA: 100 mg/dL — AB
HGB URINE DIPSTICK: NEGATIVE
KETONES UR: NEGATIVE mg/dL
LEUKOCYTES UA: NEGATIVE
Nitrite: NEGATIVE
Protein, ur: NEGATIVE mg/dL
Specific Gravity, Urine: 1.028 (ref 1.005–1.030)
Urobilinogen, UA: 0.2 mg/dL (ref 0.0–1.0)
pH: 5.5 (ref 5.0–8.0)

## 2015-01-08 LAB — I-STAT CG4 LACTIC ACID, ED: Lactic Acid, Venous: 2.38 mmol/L (ref 0.5–2.0)

## 2015-01-08 LAB — LIPASE, BLOOD: Lipase: 10 U/L — ABNORMAL LOW (ref 22–51)

## 2015-01-08 LAB — MAGNESIUM: MAGNESIUM: 1.3 mg/dL — AB (ref 1.7–2.4)

## 2015-01-08 LAB — CBG MONITORING, ED: GLUCOSE-CAPILLARY: 253 mg/dL — AB (ref 65–99)

## 2015-01-08 MED ORDER — METOPROLOL SUCCINATE ER 25 MG PO TB24
25.0000 mg | ORAL_TABLET | Freq: Every evening | ORAL | Status: DC
  Administered 2015-01-08 – 2015-01-10 (×3): 25 mg via ORAL
  Filled 2015-01-08 (×4): qty 1

## 2015-01-08 MED ORDER — TAMSULOSIN HCL 0.4 MG PO CAPS
0.4000 mg | ORAL_CAPSULE | Freq: Every day | ORAL | Status: DC
  Administered 2015-01-09 – 2015-01-10 (×2): 0.4 mg via ORAL
  Filled 2015-01-08 (×3): qty 1

## 2015-01-08 MED ORDER — VITAMIN D3 25 MCG (1000 UNIT) PO TABS
2000.0000 [IU] | ORAL_TABLET | Freq: Every evening | ORAL | Status: DC
  Administered 2015-01-08 – 2015-01-10 (×3): 2000 [IU] via ORAL
  Filled 2015-01-08 (×4): qty 2

## 2015-01-08 MED ORDER — TAMSULOSIN HCL 0.4 MG PO CAPS
0.4000 mg | ORAL_CAPSULE | Freq: Every day | ORAL | Status: DC
  Filled 2015-01-08: qty 1

## 2015-01-08 MED ORDER — ASPIRIN EC 81 MG PO TBEC
81.0000 mg | DELAYED_RELEASE_TABLET | Freq: Every day | ORAL | Status: DC
  Administered 2015-01-09 – 2015-01-11 (×3): 81 mg via ORAL
  Filled 2015-01-08 (×3): qty 1

## 2015-01-08 MED ORDER — INSULIN ASPART 100 UNIT/ML ~~LOC~~ SOLN
0.0000 [IU] | Freq: Three times a day (TID) | SUBCUTANEOUS | Status: DC
  Administered 2015-01-08 – 2015-01-09 (×2): 1 [IU] via SUBCUTANEOUS
  Administered 2015-01-10 – 2015-01-11 (×3): 2 [IU] via SUBCUTANEOUS
  Administered 2015-01-11: 1 [IU] via SUBCUTANEOUS

## 2015-01-08 MED ORDER — ALBUTEROL SULFATE (2.5 MG/3ML) 0.083% IN NEBU: 2.5000 mg | INHALATION_SOLUTION | Freq: Four times a day (QID) | RESPIRATORY_TRACT | Status: DC | PRN

## 2015-01-08 MED ORDER — SODIUM CHLORIDE 0.9 % IV SOLN
INTRAVENOUS | Status: DC
  Administered 2015-01-08 – 2015-01-10 (×3): via INTRAVENOUS

## 2015-01-08 MED ORDER — ACETAMINOPHEN 325 MG PO TABS
650.0000 mg | ORAL_TABLET | Freq: Four times a day (QID) | ORAL | Status: DC | PRN
  Administered 2015-01-09 – 2015-01-11 (×4): 650 mg via ORAL
  Filled 2015-01-08 (×4): qty 2

## 2015-01-08 MED ORDER — INSULIN ASPART 100 UNIT/ML ~~LOC~~ SOLN: 0.0000 [IU] | Freq: Every day | SUBCUTANEOUS | Status: DC

## 2015-01-08 MED ORDER — FENOFIBRATE 54 MG PO TABS
54.0000 mg | ORAL_TABLET | Freq: Every day | ORAL | Status: DC
  Administered 2015-01-08 – 2015-01-11 (×4): 54 mg via ORAL
  Filled 2015-01-08 (×4): qty 1

## 2015-01-08 MED ORDER — METRONIDAZOLE IN NACL 5-0.79 MG/ML-% IV SOLN
500.0000 mg | Freq: Three times a day (TID) | INTRAVENOUS | Status: DC
  Administered 2015-01-08 – 2015-01-11 (×9): 500 mg via INTRAVENOUS
  Filled 2015-01-08 (×12): qty 100

## 2015-01-08 MED ORDER — PRAVASTATIN SODIUM 40 MG PO TABS
40.0000 mg | ORAL_TABLET | Freq: Every day | ORAL | Status: DC
  Administered 2015-01-08 – 2015-01-10 (×3): 40 mg via ORAL
  Filled 2015-01-08 (×4): qty 1

## 2015-01-08 MED ORDER — ONDANSETRON HCL 4 MG PO TABS: 4.0000 mg | ORAL_TABLET | Freq: Four times a day (QID) | ORAL | Status: DC | PRN

## 2015-01-08 MED ORDER — SODIUM CHLORIDE 0.9 % IV SOLN: INTRAVENOUS | Status: DC

## 2015-01-08 MED ORDER — CIPROFLOXACIN IN D5W 400 MG/200ML IV SOLN
400.0000 mg | Freq: Two times a day (BID) | INTRAVENOUS | Status: DC
  Administered 2015-01-08 – 2015-01-11 (×6): 400 mg via INTRAVENOUS
  Filled 2015-01-08 (×7): qty 200

## 2015-01-08 MED ORDER — LIRAGLUTIDE 18 MG/3ML ~~LOC~~ SOPN
1.8000 mg | PEN_INJECTOR | Freq: Every morning | SUBCUTANEOUS | Status: DC
  Administered 2015-01-09 – 2015-01-11 (×3): 1.8 mg via SUBCUTANEOUS

## 2015-01-08 MED ORDER — INSULIN GLARGINE 100 UNIT/ML ~~LOC~~ SOLN
45.0000 [IU] | Freq: Every day | SUBCUTANEOUS | Status: DC
  Administered 2015-01-08 – 2015-01-10 (×3): 45 [IU] via SUBCUTANEOUS
  Filled 2015-01-08 (×4): qty 0.45

## 2015-01-08 MED ORDER — HEPARIN SODIUM (PORCINE) 5000 UNIT/ML IJ SOLN
5000.0000 [IU] | Freq: Three times a day (TID) | INTRAMUSCULAR | Status: DC
  Administered 2015-01-08 – 2015-01-09 (×3): 5000 [IU] via SUBCUTANEOUS
  Filled 2015-01-08 (×8): qty 1

## 2015-01-08 MED ORDER — CIPROFLOXACIN IN D5W 400 MG/200ML IV SOLN: 400.0000 mg | Freq: Two times a day (BID) | INTRAVENOUS | Status: DC

## 2015-01-08 MED ORDER — SODIUM CHLORIDE 0.9 % IV BOLUS (SEPSIS)
1000.0000 mL | Freq: Once | INTRAVENOUS | Status: AC
  Administered 2015-01-08: 1000 mL via INTRAVENOUS

## 2015-01-08 MED ORDER — ONDANSETRON HCL 4 MG/2ML IJ SOLN: 4.0000 mg | Freq: Four times a day (QID) | INTRAMUSCULAR | Status: DC | PRN

## 2015-01-08 MED ORDER — METRONIDAZOLE IN NACL 5-0.79 MG/ML-% IV SOLN
500.0000 mg | Freq: Three times a day (TID) | INTRAVENOUS | Status: DC
  Filled 2015-01-08: qty 100

## 2015-01-08 NOTE — ED Provider Notes (Signed)
CSN: [id number]     Arrival date & time 01/08/15  1157 History   First MD Initiated Contact with Patient 01/08/15 1239     Chief Complaint  Patient presents with  . Abdominal Pain  . Diarrhea  . Hypotension     (Consider location/radiation/quality/duration/timing/severity/associated sxs/prior Treatment) HPI  Patient presents with concern of ongoing diarrhea, weakness. Symptoms began about 30 hours ago, and since onset patient has had innumerable episodes of watery diarrhea. There is mild associated generalized abdominal pain, described as soreness, but no focal pain. No syncope, no vomiting. No medication taken for relief. With persistency of his symptoms, he went to urgent care.  There he was finally hypotensive, sent here for evaluation. He denies dyspnea, chest pain. He is here with his son, who reports the patient went out for dinner the night prior to the onset of symptoms. Patient states that he is insulin-dependent diabetic, otherwise essentially well.   Past Medical History  Diagnosis Date  . Hypertension   . Hyperlipidemia   . Type 2 diabetes mellitus   . GERD (gastroesophageal reflux disease)   . ED (erectile dysfunction)   . Prostate nodule   . BPH (benign prostatic hypertrophy)   . Elevated PSA   . Lower urinary tract symptoms (LUTS)   . Arthritis     hands, knees  . OSA (obstructive sleep apnea)     mild to moderate osa per study 02-18-2004--  no cpap  . History of colon polyps    Past Surgical History  Procedure Laterality Date  . 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 Transthoracic echocardiogram  11-20-2009    grade I diastolic dysfunction/  ef 60-65%/  mild AR, MR, and TR/  trivial PR  . Colonoscopy  last one -- april 2016  . Prostate biopsy N/A 10/01/2014    Procedure: BIOPSY TRANSRECTAL ULTRASONIC PROSTATE (TUBP);  Surgeon:  Jethro Bolus, MD;  Location: [facility];  Service: Urology;  Laterality: N/A;   Family History  Problem Relation Age of Onset  . Colon cancer Neg Hx   . Colon polyps Neg Hx   . Kidney disease Neg Hx   . Gallbladder disease Neg Hx   . Diabetes Mother   . Diabetes Father   . Esophageal cancer Neg Hx    Social History  Substance Use Topics  . Smoking status: Former Smoker -- 2.00 packs/day for 30 years    Types: Cigarettes    Quit date: 09/23/2006  . Smokeless tobacco: Never Used  . Alcohol Use: No    Review of Systems  Constitutional:       Per HPI, otherwise negative  HENT:       Per HPI, otherwise negative  Respiratory:       Per HPI, otherwise negative  Cardiovascular:       Per HPI, otherwise negative  Gastrointestinal: Positive for abdominal pain and diarrhea. Negative for vomiting.  Endocrine:       Negative aside from HPI  Genitourinary:       Neg aside from HPI   Musculoskeletal:       Per HPI, otherwise negative  Skin: Negative.   Neurological: Negative for syncope.      Allergies  Latex and Codeine  Home Medications   Prior to Admission medications   Medication Sig Start Date End  Date Taking? Authorizing Provider  acetaminophen (TYLENOL) 500 MG tablet Take 1,000 mg by mouth every 6 (six) hours as needed for moderate pain.    Yes Historical Provider, MD  albuterol (PROVENTIL HFA;VENTOLIN HFA) 108 (90 BASE) MCG/ACT inhaler Inhale 2 puffs into the lungs as needed for wheezing or shortness of breath.  03/01/14  Yes Historical Provider, MD  albuterol (PROVENTIL) (2.5 MG/3ML) 0.083% nebulizer solution Take 2.5 mg by nebulization every 6 (six) hours as needed for wheezing or shortness of breath.    Yes Historical Provider, MD  aspirin 81 MG tablet Take 81 mg by mouth daily.     Yes Historical Provider, MD  Cholecalciferol (VITAMIN D3) 2000 UNITS TABS Take 1 capsule by mouth every evening.    Yes Historical Provider, MD  fenofibrate 54 MG  tablet TAKE 1 TABLET EVERY DAY Patient taking differently: TAKE 1 TABLET EVERY DAY-- takes in pm 07/10/13  Yes Romero Belling, MD  Insulin Glargine (LANTUS) 100 UNIT/ML Solostar Pen Inject 80 Units into the skin daily at 10 pm. INJECT 25 - 50 UNITS SUBCUTANEOUSLY DAILY BASED ON TITRATION SCHEDULE 04/19/14  Yes Historical Provider, MD  Liraglutide 18 MG/3ML SOPN Inject 1.8 mg into the skin every morning.   Yes Historical Provider, MD  lisinopril-hydrochlorothiazide (PRINZIDE,ZESTORETIC) 20-25 MG per tablet Take 1 tablet by mouth every morning.  03/01/14  Yes Historical Provider, MD  metFORMIN (GLUCOPHAGE) 1000 MG tablet Take 1,000 mg by mouth 2 (two) times daily. 08/03/14  Yes Historical Provider, MD  metoprolol succinate (TOPROL-XL) 25 MG 24 hr tablet Take 1 tablet (25 mg total) by mouth daily. Patient taking differently: Take 25 mg by mouth every evening.  05/13/13  Yes Romero Belling, MD  Multiple Vitamin (MULTIVITAMIN) tablet Take 1 tablet by mouth every evening.    Yes Historical Provider, MD  omeprazole (PRILOSEC) 20 MG capsule Take 1 capsule (20 mg total) by mouth daily. Patient taking differently: Take 20 mg by mouth every evening.  05/13/13  Yes Romero Belling, MD  pravastatin (PRAVACHOL) 40 MG tablet Take 40 mg by mouth at bedtime.     Yes Historical Provider, MD  Red Yeast Rice 600 MG CAPS Take 1 capsule by mouth daily.    Yes Historical Provider, MD  tamsulosin (FLOMAX) 0.4 MG CAPS capsule Take 0.4 mg by mouth daily after supper.  03/01/14  Yes Historical Provider, MD  vitamin B-12 (CYANOCOBALAMIN) 1000 MCG tablet Take 1,000 mcg by mouth every evening.    Yes Historical Provider, MD   BP 112/50 mmHg  Pulse 71  Temp(Src) 97.4 F (36.3 C) (Oral)  Resp 14  SpO2 94% Physical Exam  Constitutional: He is oriented to person, place, and time. He appears well-developed. No distress.  HENT:  Head: Normocephalic and atraumatic.  Eyes: Conjunctivae and EOM are normal.  Cardiovascular: Normal rate and  regular rhythm.   Pulmonary/Chest: Effort normal. No stridor. No respiratory distress.  Abdominal: He exhibits no distension. There is no rigidity, no guarding, no tenderness at McBurney's point and negative Murphy's sign.    Musculoskeletal: He exhibits no edema.  Neurological: He is alert and oriented to person, place, and time.  Skin: Skin is warm and dry.  Psychiatric: He has a normal mood and affect.  Nursing note and vitals reviewed.   ED Course  Procedures (including critical care time) Labs Review Labs Reviewed  LIPASE, BLOOD - Abnormal; Notable for the following:    Lipase 10 (*)    All other components within normal limits  COMPREHENSIVE METABOLIC PANEL - Abnormal; Notable for the following:    Sodium 132 (*)    Chloride 99 (*)    Glucose, Bld 273 (*)    BUN 37 (*)    Creatinine, Ser 1.90 (*)    Calcium 8.1 (*)    Total Protein 6.0 (*)    Albumin 3.3 (*)    GFR calc non Af Amer 35 (*)    GFR calc Af Amer 41 (*)    All other components within normal limits  I-STAT CG4 LACTIC ACID, ED - Abnormal; Notable for the following:    Lactic Acid, Venous 2.38 (*)    All other components within normal limits  CBG MONITORING, ED - Abnormal; Notable for the following:    Glucose-Capillary 253 (*)    All other components within normal limits  CBC  URINALYSIS, ROUTINE W REFLEX MICROSCOPIC (NOT AT [facility])  GI PATHOGEN PANEL BY PCR, STOOL    I have personally reviewed and evaluated these images and lab results as part of my medical decision-making.   EKG Interpretation   Date/Time:  Saturday January 08 2015 12:42:46 EDT Ventricular Rate:  81 PR Interval:  150 QRS Duration: 110 QT Interval:  387 QTC Calculation: 449 R Axis:   98 Text Interpretation:  Sinus rhythm Atrial premature complexes Probable  left atrial enlargement Right axis deviation Anteroseptal infarct, old  Borderline repolarization abnormality Sinus rhythm T wave abnormality  Premature atrial complexes  Abnormal ekg Confirmed by Gerhard Munch  MD  [id number]) on 01/08/2015 12:47:33 PM      Patient 68/43 initially.  NS started empirically.  Cardiac: 65 sr, nml O2- 100%ra, nml    On repeat exam the patient is now improving, blood pressure has trended up, and is now 112/50.  Labs reviewed with patient and family members, including acute kidney injury, with creatinine of almost 2. Patient has received 2 L of fluid resuscitation.  On repeat exam the patient continues to improve.    MDM   Final diagnoses:  AKI (acute kidney injury)  Hypotension, unspecified hypotension type     Patient presents with weakness, ongoing diarrhea, abdominal pain. Patient is a mild dizziness, but no syncope, no focal neurologic deficits. No evidence for sepsis or bacteremia, and the patient had substantial improvement here, though his initial blood pressure was critically low, this is likely secondary to volume loss, less likely due to sepsis. Patient had a soft, non-peritoneal abdomen, and with no vomiting, there is low suspicion for enteritis, acute abdominal process. After 2 L fluid resuscitation and the patient had substantial improvement in his blood pressure, he was continued appropriate mentation, no fever, patient was appropriate for floor admission.  CRITICAL CARE Performed by: Gerhard Munch Total critical care time: 35 Critical care time was exclusive of separately billable procedures and treating other patients. Critical care was necessary to treat or prevent imminent or life-threatening deterioration. Critical care was time spent personally by me on the following activities: development of treatment plan with patient and/or surrogate as well as nursing, discussions with consultants, evaluation of patient's response to treatment, examination of patient, obtaining history from patient or surrogate, ordering and performing treatments and interventions, ordering and review of laboratory studies,  ordering and review of radiographic studies, pulse oximetry and re-evaluation of patient's condition.   Gerhard Munch, MD 01/08/15 [phone]

## 2015-01-08 NOTE — ED Notes (Signed)
Pt c/o generalized abdominal pain and diarrhea x 2 days.  Pain score 10/10.  Pt has not taken anything for symptoms.  Denies rectal bleeding.

## 2015-01-08 NOTE — ED Notes (Signed)
Pt needs ultrasound IV

## 2015-01-08 NOTE — H&P (Addendum)
History and Physical   Todd Krause ZOX:[id number] DOB: 05/10/1950 DOA: 01/08/2015  Referring physician: Dr. Jeraldine Loots PCP: Todd Palau, FNP  Specialists: none  Chief Complaint: diarrhea  HPI: Todd Krause is a 64 y.o. male has a past medical history significant for hypertension, hyperlipidemia, type 2 diabetes, presents to the emergency room with the chief complaint of severe diarrhea ongoing for the past 2 days. 2 nights ago, he ate out at [employer] (an omelette with bacon, sausage, mushrooms), and then he was fine afterwards, and when he woke up the next morning around 4 AM he started having profuse watery diarrhea having to go to the bathroom "almost continuously". He denies any fever or chills, he denies any blood in his stool, he denies any chest pain or shortness of breath. He endorses lightheadedness in the past day. His diarrhea has not stopped and it still going on even in the emergency room. He endorses crampy abdominal pain. Nobody else that was eating with him at restaurant got sick. He denies eating raw seafood. In the emergency room, patient was found to be hypotensive into the 60s over 40s on admission, he was found to have acute renal failure with a creatinine elevation to1.9 from previous normal values, he was found to have elevated lactic acid of 2.38, mild hyponatremia of 132. He was given Krause IV fluids with subsequent improvement in his blood pressure into the 120s systolic, and TRH was asked for admission for acute renal failure.   Review of Systems: As per history of present illness, otherwise 10 point review of systems negative   Past Medical History  Diagnosis Date  . Hypertension   . Hyperlipidemia   . Type 2 diabetes mellitus   . GERD (gastroesophageal reflux disease)   . ED (erectile dysfunction)   . Prostate nodule   . BPH (benign prostatic hypertrophy)   . Elevated PSA   . Lower urinary tract symptoms (LUTS)   . Arthritis    hands, knees  . OSA (obstructive sleep apnea)     mild to moderate osa per study 02-18-2004--  no cpap  . History of colon polyps    Past Surgical History  Procedure Laterality Date  . 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 Transthoracic echocardiogram  11-20-2009    grade I diastolic dysfunction/  ef 60-65%/  mild AR, MR, and TR/  trivial PR  . Colonoscopy  last one -- april 2016  . Prostate biopsy N/A 10/01/2014    Procedure: BIOPSY TRANSRECTAL ULTRASONIC PROSTATE (TUBP);  Surgeon: Todd Bolus, MD;  Location: [facility];  Service: Urology;  Laterality: N/A;   Social History:  reports that he quit smoking about 8 years ago. His smoking use included Cigarettes. He has a 60 pack-year smoking history. He has never used smokeless tobacco. He reports that he does not drink alcohol or use illicit drugs.  Allergies  Allergen Reactions  . Latex Hives  . Codeine Other (See Comments)    constipation    Family History  Problem Relation Age of Onset  . Colon cancer Neg Hx   . Colon polyps Neg Hx   . Kidney disease Neg Hx   . Gallbladder disease Neg Hx   . Diabetes Mother   . Diabetes Father   . Esophageal cancer Neg Hx  Prior to Admission medications   Medication Sig Start Date End Date Taking? Authorizing Provider  acetaminophen (TYLENOL) 500 MG tablet Take 1,000 mg by mouth every 6 (six) hours as needed for moderate pain.    Yes Historical Provider, MD  albuterol (PROVENTIL HFA;VENTOLIN HFA) 108 (90 BASE) MCG/ACT inhaler Inhale 2 puffs into the lungs as needed for wheezing or shortness of breath.  03/01/14  Yes Historical Provider, MD  albuterol (PROVENTIL) (2.5 MG/3ML) 0.083% nebulizer solution Take 2.5 mg by nebulization every 6 (six) hours as needed for wheezing or shortness of breath.    Yes Historical Provider, MD  aspirin 81 MG tablet  Take 81 mg by mouth daily.     Yes Historical Provider, MD  Cholecalciferol (VITAMIN D3) 2000 UNITS TABS Take 1 capsule by mouth every evening.    Yes Historical Provider, MD  fenofibrate 54 MG tablet TAKE 1 TABLET EVERY DAY Patient taking differently: TAKE 1 TABLET EVERY DAY-- takes in pm 07/10/13  Yes Romero Belling, MD  Insulin Glargine (LANTUS) 100 UNIT/ML Solostar Pen Inject 80 Units into the skin daily at 10 pm. INJECT 25 - 50 UNITS SUBCUTANEOUSLY DAILY BASED ON TITRATION SCHEDULE 04/19/14  Yes Historical Provider, MD  Liraglutide 18 MG/3ML SOPN Inject 1.8 mg into the skin every morning.   Yes Historical Provider, MD  lisinopril-hydrochlorothiazide (PRINZIDE,ZESTORETIC) 20-25 MG per tablet Take 1 tablet by mouth every morning.  03/01/14  Yes Historical Provider, MD  metFORMIN (GLUCOPHAGE) 1000 MG tablet Take 1,000 mg by mouth 2 (two) times daily. 08/03/14  Yes Historical Provider, MD  metoprolol succinate (TOPROL-XL) 25 MG 24 hr tablet Take 1 tablet (25 mg total) by mouth daily. Patient taking differently: Take 25 mg by mouth every evening.  05/13/13  Yes Romero Belling, MD  Multiple Vitamin (MULTIVITAMIN) tablet Take 1 tablet by mouth every evening.    Yes Historical Provider, MD  omeprazole (PRILOSEC) 20 MG capsule Take 1 capsule (20 mg total) by mouth daily. Patient taking differently: Take 20 mg by mouth every evening.  05/13/13  Yes Romero Belling, MD  pravastatin (PRAVACHOL) 40 MG tablet Take 40 mg by mouth at bedtime.     Yes Historical Provider, MD  Red Yeast Rice 600 MG CAPS Take 1 capsule by mouth daily.    Yes Historical Provider, MD  tamsulosin (FLOMAX) 0.4 MG CAPS capsule Take 0.4 mg by mouth daily after supper.  03/01/14  Yes Historical Provider, MD  vitamin B-12 (CYANOCOBALAMIN) 1000 MCG tablet Take 1,000 mcg by mouth every evening.    Yes Historical Provider, MD   Physical Exam: Filed Vitals:   01/08/15 1430 01/08/15 1434 01/08/15 1445 01/08/15 1500  BP: 101/50 87/56 109/52 112/50    Pulse: 74 78 80 71  Temp:      TempSrc:      Resp: SpO2: 95% 95% 96% 94%     GENERAL: NAD  HEENT: head NCAT, no scleral icterus. Pupils round and reactive. Mucous membranes are dry. Posterior pharynx clear of any exudate or lesions.  LUNGS: Clear to auscultation. No wheezing or crackles  HEART: Regular rate and rhythm without murmur. 2+ pulses, no JVD, no peripheral edema  ABDOMEN: Soft, obese, nontender, and nondistended. Positive bowel sounds.   EXTREMITIES: Without any cyanosis, clubbing, rash, lesions or edema.  NEUROLOGIC: Alert and oriented x3. Strength 5/5 in all 4.  PSYCHIATRIC: Normal mood and affect  SKIN: No ulceration or induration present.   Labs on Admission:  Basic Metabolic Panel:  Recent Labs Lab 01/08/15 1340  NA 132*  K 4.5  CL 99*  CO2 23  GLUCOSE 273*  BUN 37*  CREATININE 1.90*  CALCIUM 8.1*   Liver Function Tests:  Recent Labs Lab 01/08/15 1340  AST 18  ALT 23  ALKPHOS 77  BILITOT 1.0  PROT 6.0*  ALBUMIN 3.3*    Recent Labs Lab 01/08/15 1340  LIPASE 10*   CBC:  Recent Labs Lab 01/08/15 1340  WBC 9.8  HGB 15.9  HCT 46.6  MCV 86.5  PLT 280   CBG:  Recent Labs Lab 01/08/15 1358  GLUCAP 253*    EKG: Independently reviewed. Sinus rhythm  Assessment/Plan Active Problems:   HYPERCHOLESTEROLEMIA   Essential hypertension   AKI (acute kidney injury)   Diabetes mellitus   Diarrhea   Profuse diarrhea  - Possibly related to food poisoning, no risk factors for C. difficile, no recent antibiotics  - Given profound hypotension, elevated lactic acid, tremor likely due to dehydration as well, we'll start ciprofloxacin and metronidazole, and send stool studies  - Continue IV hydration  - His abdominal exam is fairly benign, he has no tenderness to palpation, no rebound, will not image at this point  Acute renal failure  - In the setting of dehydration, IV fluids and repeat BMP in the  morning  Hypertension - Hold his home antihypertensives for now  Hyperlipidemia - Resume his statin  Diabetes mellitus - Resume his Lantus at a lower dose given renal failure, add sliding scale insulin, obtain hemoglobin A1c  Hyponatremia - Due to dehydration, monitor  Diet: carb modified Fluids: NS DVT Prophylaxis: heparin  Code Status: Full  Family Communication: extended family bedside  Disposition Plan: admit to [facility]. Elvera Lennox, MD [facility] Pager [phone]  If 7PM-7AM, please contact night-coverage [email or web address] Password [facility] 01/08/2015, 3:37 PM

## 2015-01-08 NOTE — ED Notes (Signed)
Results given to dr Hermina Staggers

## 2015-01-08 NOTE — ED Notes (Signed)
Patient states that he has had diarrhea and abdominal pain since Thursday. Patient reports that this morning he has been dizzy when changing positions.

## 2015-01-09 LAB — GLUCOSE, CAPILLARY
Glucose-Capillary: 117 mg/dL — ABNORMAL HIGH (ref 65–99)
Glucose-Capillary: 100 mg/dL — ABNORMAL HIGH (ref 65–99)
Glucose-Capillary: 160 mg/dL — ABNORMAL HIGH (ref 65–99)

## 2015-01-09 LAB — BASIC METABOLIC PANEL
ANION GAP: 6 (ref 5–15)
BUN: 23 mg/dL — ABNORMAL HIGH (ref 6–20)
CALCIUM: 7.5 mg/dL — AB (ref 8.9–10.3)
CO2: 26 mmol/L (ref 22–32)
Chloride: 104 mmol/L (ref 101–111)
Creatinine, Ser: 0.96 mg/dL (ref 0.61–1.24)
GFR calc non Af Amer: >60 mL/min (ref >60)
GLUCOSE: 114 mg/dL — AB (ref 65–99)
Potassium: 3.7 mmol/L (ref 3.5–5.1)
SODIUM: 136 mmol/L (ref 135–145)

## 2015-01-09 LAB — CBC
HCT: 40.1 % (ref 39.0–52.0)
HEMOGLOBIN: 13.4 g/dL (ref 13.0–17.0)
MCH: 28.7 pg (ref 26.0–34.0)
MCHC: 33.4 g/dL (ref 30.0–36.0)
MCV: 85.9 fL (ref 78.0–100.0)
Platelets: 283 10*3/uL (ref 150–400)
RBC: 4.67 MIL/uL (ref 4.22–5.81)
RDW: 13.2 % (ref 11.5–15.5)
WBC: 9.1 10*3/uL (ref 4.0–10.5)

## 2015-01-09 LAB — LACTIC ACID, PLASMA: LACTIC ACID, VENOUS: 0.8 mmol/L (ref 0.5–2.0)

## 2015-01-09 LAB — MAGNESIUM: MAGNESIUM: 2 mg/dL (ref 1.7–2.4)

## 2015-01-09 MED ORDER — MAGNESIUM SULFATE 2 GM/50ML IV SOLN
2.0000 g | Freq: Once | INTRAVENOUS | Status: AC
  Administered 2015-01-09: 2 g via INTRAVENOUS
  Filled 2015-01-09: qty 50

## 2015-01-09 NOTE — Progress Notes (Signed)
PROGRESS NOTE  Todd Krause:[id number] DOB: 30-Jan-1951 DOA: 01/08/2015 PCP: Elizabeth Palau, FNP   HPI: 64 y.o. male has a past medical history significant for hypertension, hyperlipidemia, type 2 diabetes, presents to the emergency room with the chief complaint of severe diarrhea ongoing for the past 2 days after eating at a restaurant. He was admitted on 9/10 with profound dehydration, hypotension and renal failure.   Subjective / 24 H Interval events - feeling better, more energy, has a good appetite - ongoing diarrhea but slowing down, only 2 loose BMs overnight.   Assessment/Plan: Active Problems:   HYPERCHOLESTEROLEMIA   Essential hypertension   AKI (acute kidney injury)   Diabetes mellitus   Diarrhea   Hyponatremia   Profuse diarrhea  - Possibly related to food poisoning, no risk factors for C. difficile, no recent antibiotics  - improving, continue fluids, antibiotics - no abdominal pain  Acute renal failure  - In the setting of dehydration - improved this morning  Hypertension - Hold his home antihypertensives for now  Hyperlipidemia - Resume his statin  Diabetes mellitus - continue Lantus / SSI, CBGs with good control - A1C pending  Hyponatremia - resolved   Diet:   Fluids: NS DVT Prophylaxis: heparin  Code Status: Full Code Family Communication: no family bedside  Disposition Plan: home in 1-2 days  Consultants:  None  Procedures:  None    Antibiotics Ciprofloxacin 9/10 >> Metronidazole 9/10 >>   Studies  No results found.  Objective  Filed Vitals:   01/08/15 1635 01/08/15 1700 01/08/15 2151 01/09/15 0627  BP: 113/47  116/53 129/64  Pulse:   68 71  Temp:   98.4 F (36.9 C) 98.4 F (36.9 C)  TempSrc:   Oral Oral  Resp:   16 16  Height:   (1.702 m)    Weight:  100.1 kg (220 lb 10.9 oz)    SpO2:   100%     Intake/Output Summary (Last 24 hours) at 01/09/15 1019 Last data filed at 01/09/15 8119  Gross per 24 hour  Intake    440 ml  Output      0 ml  Net    440 ml   Filed Weights   01/08/15 1700  Weight: 100.1 kg (220 lb 10.9 oz)    Exam:  GENERAL: NAD  HEENT: head NCAT, no scleral icterus. Pupils round and reactive.   LUNGS: Clear to auscultation. No wheezing or crackles  HEART: Regular rate and rhythm without murmur. 2+ pulses, no JVD, no peripheral edema  ABDOMEN: Soft, nontender, and nondistended. Positive bowel sounds.  EXTREMITIES: Without any cyanosis, clubbing  Data Reviewed: Basic Metabolic Panel:  Recent Labs Lab 01/08/15 1340 01/09/15 0533  NA 132* 136  K 4.5 3.7  CL 99* 104  CO2 23 26  GLUCOSE 273* 114*  BUN 37* 23*  CREATININE 1.90* 0.96  CALCIUM 8.1* 7.5*  MG 1.3* 2.0   Liver Function Tests:  Recent Labs Lab 01/08/15 1340  AST 18  ALT 23  ALKPHOS 77  BILITOT 1.0  PROT 6.0*  ALBUMIN 3.3*    Recent Labs Lab 01/08/15 1340  LIPASE 10*   CBC:  Recent Labs Lab 01/08/15 1340 01/09/15 0533  WBC 9.8 9.1  HGB 15.9 13.4  HCT 46.6 40.1  MCV 86.5 85.9  PLT 280 283   CBG:  Recent Labs Lab 01/08/15 1358 01/08/15 1646 01/08/15 2149  GLUCAP 253* 145* 124*    Scheduled Meds: . aspirin EC  81  mg Oral Daily  . cholecalciferol  2,000 Units Oral QPM  . ciprofloxacin  400 mg Intravenous BID  . fenofibrate  54 mg Oral Daily  . heparin  5,000 Units Subcutaneous 3 times per day  . insulin aspart  0-5 Units Subcutaneous QHS  . insulin aspart  0-9 Units Subcutaneous TID WC  . insulin glargine  45 Units Subcutaneous QHS  . Liraglutide  1.8 mg Subcutaneous q morning - 10a  . metoprolol succinate  25 mg Oral QPM  . metronidazole  500 mg Intravenous 3 times per day  . pravastatin  40 mg Oral QHS  . tamsulosin  0.4 mg Oral QPC supper   Continuous Infusions: . sodium chloride 100 mL/hr at 01/08/15 1643    Pamella Pert, MD [facility] Pager [phone]. If 7 PM - 7 AM, please contact night-coverage at [email or web address],  password [facility] 01/09/2015, 10:19 AM  LOS: 1 day

## 2015-01-09 NOTE — Progress Notes (Signed)
Utilization Review Completed.[facility] T9/03/2015  

## 2015-01-09 NOTE — Progress Notes (Signed)
Pt c/o pain when trying to urinate at lower bladder area. Unable to void. Bladder scan reveals >400. MD paged. Will in and out cath at this time. 1000cc drained. Will monitor for voiding issues. Pt states he sees Dr. Patsi Sears as urologist for urinary retention. Pt states he last saw Patsi Sears about 2 months ago. Pt states he takes 2 Flomax in a.m. And 2 Flomax in p.m. Pt states this is the FIRST time he has ever been catheterized.

## 2015-01-10 DIAGNOSIS — I10 Essential (primary) hypertension: Secondary | ICD-10-CM

## 2015-01-10 DIAGNOSIS — R338 Other retention of urine: Secondary | ICD-10-CM

## 2015-01-10 DIAGNOSIS — N2889 Other specified disorders of kidney and ureter: Secondary | ICD-10-CM

## 2015-01-10 LAB — BASIC METABOLIC PANEL
Anion gap: 7 (ref 5–15)
BUN: 12 mg/dL (ref 6–20)
CHLORIDE: 108 mmol/L (ref 101–111)
CO2: 25 mmol/L (ref 22–32)
CREATININE: 0.88 mg/dL (ref 0.61–1.24)
Calcium: 7.8 mg/dL — ABNORMAL LOW (ref 8.9–10.3)
GFR calc Af Amer: >60 mL/min (ref >60)
GFR calc non Af Amer: >60 mL/min (ref >60)
Glucose, Bld: 104 mg/dL — ABNORMAL HIGH (ref 65–99)
Potassium: 3.7 mmol/L (ref 3.5–5.1)
Sodium: 140 mmol/L (ref 135–145)

## 2015-01-10 LAB — HEMOGLOBIN A1C
Hgb A1c MFr Bld: 6.7 % — ABNORMAL HIGH (ref 4.8–5.6)
Mean Plasma Glucose: 146 mg/dL

## 2015-01-10 LAB — GLUCOSE, CAPILLARY
GLUCOSE-CAPILLARY: 186 mg/dL — AB (ref 65–99)
GLUCOSE-CAPILLARY: 183 mg/dL — AB (ref 65–99)
Glucose-Capillary: 116 mg/dL — ABNORMAL HIGH (ref 65–99)
Glucose-Capillary: 114 mg/dL — ABNORMAL HIGH (ref 65–99)
Glucose-Capillary: 173 mg/dL — ABNORMAL HIGH (ref 65–99)

## 2015-01-10 MED ORDER — LISINOPRIL 20 MG PO TABS
20.0000 mg | ORAL_TABLET | Freq: Every day | ORAL | Status: DC
  Administered 2015-01-10 – 2015-01-11 (×2): 20 mg via ORAL
  Filled 2015-01-10 (×2): qty 1

## 2015-01-10 MED ORDER — LISINOPRIL-HYDROCHLOROTHIAZIDE 20-25 MG PO TABS: 1.0000 | ORAL_TABLET | Freq: Every morning | ORAL | Status: DC

## 2015-01-10 MED ORDER — HYDROCHLOROTHIAZIDE 25 MG PO TABS
25.0000 mg | ORAL_TABLET | Freq: Every day | ORAL | Status: DC
  Administered 2015-01-10 – 2015-01-11 (×2): 25 mg via ORAL
  Filled 2015-01-10 (×2): qty 1

## 2015-01-10 MED ORDER — FINASTERIDE 5 MG PO TABS
5.0000 mg | ORAL_TABLET | Freq: Every day | ORAL | Status: DC
  Administered 2015-01-10 – 2015-01-11 (×2): 5 mg via ORAL
  Filled 2015-01-10 (×2): qty 1

## 2015-01-10 NOTE — Care Management Important Message (Signed)
Important Message  Patient Details IM Letter given to Rhonda/ Case Manager to present to patientImportant Message  Patient Details  Name: HANLEY WOERNER MRN: [id number] Date of Birth: Sep 20, 1950   Medicare Important Message Given:  Yes-second notification given    Haskell Flirt 01/10/2015, 12:44 PM Name: AMOS MICHEALS MRN: [id number] Date of Birth: 12-Mar-1951   Medicare Important Message Given:  Yes-second notification given    Haskell Flirt 01/10/2015, 12:43 PM

## 2015-01-10 NOTE — Progress Notes (Signed)
PROGRESS NOTE  Todd Krause ZOX:[id number] DOB: 22-May-1950 DOA: 01/08/2015 PCP: Elizabeth Palau, FNP   HPI: 64 y.o. male has a past medical history significant for hypertension, hyperlipidemia, type 2 diabetes, presents to the emergency room with the chief complaint of severe diarrhea ongoing for the past 2 days after eating at a restaurant. He was admitted on 9/10 with profound dehydration, hypotension and renal failure.   Subjective / 24 H Interval events - overall feeling better - developed urinary retention yesterday evening requiring foley placement.   Assessment/Plan: Active Problems:   HYPERCHOLESTEROLEMIA   Essential hypertension   AKI (acute kidney injury)   Diabetes mellitus   Diarrhea   Hyponatremia   Profuse diarrhea  - Possibly related to food poisoning, no risk factors for C. difficile, no recent antibiotics  - continue fluids, antibiotics - no abdominal pain - ongoing, some improvement today however still ongoing loose BMs  Acute urinary retention - discussed with Dr. Annabell Howells from Urology today, patient with known BPH, patient of Dr. Patsi Sears (who is out of office), after chart review he recommended addition of Finasteride and Foley on discharge with outpatient follow up in 1-2 weeks   Acute renal failure  - In the setting of dehydration - improved with fluids  Hypertension - BP elevated, resume his ACEI today   Hyperlipidemia - continue home medications  Diabetes mellitus - continue Lantus / SSI, CBGs with good control - A1C at 6.7 showing good control  Hyponatremia - resolved   Diet:   Fluids: NS DVT Prophylaxis: heparin  Code Status: Full Code Family Communication: d/w wife Disposition Plan: home in 1-2 days  Consultants:  None  Procedures:  None    Antibiotics Ciprofloxacin 9/10 >> Metronidazole 9/10 >>   Studies  No results found.  Objective  Filed Vitals:   01/09/15 0627 01/09/15 1631 01/09/15 2210  01/10/15 0553  BP: 129/64 158/74 146/55 158/60  Pulse: 71 66 79 70  Temp: 98.4 F (36.9 C) 97.7 F (36.5 C) 98.7 F (37.1 C) 98 F (36.7 C)  TempSrc: Oral Oral Oral Oral  Resp: 16 16 16 16   Height:      Weight:      SpO2:  96% 96% 96%    Intake/Output Summary (Last 24 hours) at 01/10/15 1238 Last data filed at 01/10/15 1000  Gross per 24 hour  Intake   1780 ml  Output   3025 ml  Net  -1245 ml   Filed Weights   01/08/15 1700  Weight: 100.1 kg (220 lb 10.9 oz)    Exam:  GENERAL: NAD  HEENT: head NCAT, no scleral icterus. Pupils round and reactive.   LUNGS: Clear to auscultation. No wheezing or crackles  HEART: Regular rate and rhythm without murmur. 2+ pulses, no JVD, no peripheral edema  ABDOMEN: Soft, nontender, and nondistended. Positive bowel sounds.  EXTREMITIES: Without any cyanosis, clubbing  Data Reviewed: Basic Metabolic Panel:  Recent Labs Lab 01/08/15 1340 01/09/15 0533 01/10/15 0537  NA 132* 136 140  K 4.5 3.7 3.7  CL 99* 104 108  CO2 23 26 25   GLUCOSE 273* 114* 104*  BUN 37* 23* 12  CREATININE 1.90* 0.96 0.88  CALCIUM 8.1* 7.5* 7.8*  MG 1.3* 2.0  --    Liver Function Tests:  Recent Labs Lab 01/08/15 1340  AST 18  ALT 23  ALKPHOS 77  BILITOT 1.0  PROT 6.0*  ALBUMIN 3.3*    Recent Labs Lab 01/08/15 1340  LIPASE 10*   CBC:  Recent Labs Lab 01/08/15 1340 01/09/15 0533  WBC 9.8 9.1  HGB 15.9 13.4  HCT 46.6 40.1  MCV 86.5 85.9  PLT 280 283   CBG:  Recent Labs Lab 01/09/15 1148 01/09/15 1637 01/09/15 2208 01/10/15 0714 01/10/15 1151  GLUCAP 117* 100* 160* 114* 186*    Scheduled Meds: . aspirin EC  81 mg Oral Daily  . cholecalciferol  2,000 Units Oral QPM  . ciprofloxacin  400 mg Intravenous BID  . fenofibrate  54 mg Oral Daily  . insulin aspart  0-5 Units Subcutaneous QHS  . insulin aspart  0-9 Units Subcutaneous TID WC  . insulin glargine  45 Units Subcutaneous QHS  . Liraglutide  1.8 mg Subcutaneous q  morning - 10a  . metoprolol succinate  25 mg Oral QPM  . metronidazole  500 mg Intravenous 3 times per day  . pravastatin  40 mg Oral QHS  . tamsulosin  0.4 mg Oral QPC supper   Continuous Infusions: . sodium chloride 100 mL/hr at 01/09/15 2346   Time spent: 25 minutes, more than 50% bedside discussing care with patient, his wife  Pamella Pert, MD [facility] Pager [phone]. If 7 PM - 7 AM, please contact night-coverage at [email or web address], password [facility] 01/10/2015, 12:38 PM  LOS: 2 days

## 2015-01-11 LAB — C DIFFICILE QUICK SCREEN W PCR REFLEX
C DIFFICILE (CDIFF) TOXIN: NEGATIVE
C DIFFICLE (CDIFF) ANTIGEN: NEGATIVE
C Diff interpretation: NEGATIVE

## 2015-01-11 LAB — GLUCOSE, CAPILLARY
GLUCOSE-CAPILLARY: 184 mg/dL — AB (ref 65–99)
Glucose-Capillary: 132 mg/dL — ABNORMAL HIGH (ref 65–99)

## 2015-01-11 MED ORDER — METRONIDAZOLE 500 MG PO TABS: 500.0000 mg | ORAL_TABLET | Freq: Three times a day (TID) | ORAL | Status: DC

## 2015-01-11 MED ORDER — CIPROFLOXACIN HCL 500 MG PO TABS: 500.0000 mg | ORAL_TABLET | Freq: Two times a day (BID) | ORAL | Status: DC

## 2015-01-11 MED ORDER — FINASTERIDE 5 MG PO TABS: 5.0000 mg | ORAL_TABLET | Freq: Every day | ORAL | Status: AC

## 2015-01-11 NOTE — Discharge Summary (Signed)
Physician Discharge Summary  Todd Krause VWU:[id number] DOB: 1950-07-01 DOA: 01/08/2015  PCP: Elizabeth Palau, FNP  Admit date: 01/08/2015 Discharge date: 01/11/2015  Time spent: > 35 minutes  Recommendations for Outpatient Follow-up:  1. Follow up with Elizabeth Palau in 1-2 weeks as needed 2. Follow up with Dr. Patsi Sears in 1 week   Discharge Diagnoses:  Active Problems:   HYPERCHOLESTEROLEMIA   Essential hypertension   AKI (acute kidney injury)   Diabetes mellitus   Diarrhea   Hyponatremia  Discharge Condition: stable  Diet recommendation: diabetic  Filed Weights   01/08/15 1700  Weight: 100.1 kg (220 lb 10.9 oz)   History of present illness:  Todd Krause is a 64 y.o. male has a past medical history significant for hypertension, hyperlipidemia, type 2 diabetes, presents to the emergency room with the chief complaint of severe diarrhea ongoing for the past 2 days. 2 nights ago, he ate out at [employer] (an omelette with bacon, sausage, mushrooms), and then he was fine afterwards, and when he woke up the next morning around 4 AM he started having profuse watery diarrhea having to go to the bathroom "almost continuously". He denies any fever or chills, he denies any blood in his stool, he denies any chest pain or shortness of breath. He endorses lightheadedness in the past day. His diarrhea has not stopped and it still going on even in the emergency room. He endorses crampy abdominal pain. Nobody else that was eating with him at restaurant got sick. He denies eating raw seafood. In the emergency room, patient was found to be hypotensive into the 60s over 40s on admission, he was found to have acute renal failure with a creatinine elevation to1.9 from previous normal values, he was found to have elevated lactic acid of 2.38, mild hyponatremia of 132. He was given bolus IV fluids with subsequent improvement in his blood pressure into the 120s systolic, and TR[facility]  was asked for admission for acute renal failure.   Hospital Course:  Patient was admitted to the hospital with diarrheal illness after consuming a meal at a restaurant the night before, likely representing food poisoning. Due to severity of his diarrhea and renal failure, he was started on empiric Ciprofloxacin and Metronidazole. His diarrhea improved, his stools have starting to become more formed and he gradually improved his appetite and was able to eat a regular diet without abdominal pain, nausea or vomiting. His antibiotics were converted to po and he was discharged home in stable condition to complete a course as an outpatient. His C diff was checked and it returned negative, GI pathogen panel was sent and is still pending at the time of discharge. His renal failure on admission was attributed to severe dehydration and it resolved with IVF, his creatinine on discharge has returned to normal range, 0.88. During his hospitalization patient developed acute urinary retention for which he needed placement of a foley catheter. I discussed with on call Urologist Dr. Annabell Howells, who reviewed the case and recommended addition of Finasteride and discharge home with a Foley and that patient should follow up with Dr. Patsi Sears in 1-2 weeks. Patient's other medical problems have remained stable and he was discharged home with no further medication changes.   Procedures:  None    Consultations:  None   Discharge Exam: Filed Vitals:   01/10/15 0553 01/10/15 1501 01/10/15 2147 01/11/15 0548  BP: 158/60 170/78 197/65 157/62  Pulse: 70 80 66 65  Temp: 98 F (36.7 C)  97.8 F (36.6 C) 98 F (36.7 C) 98.5 F (36.9 C)  TempSrc: Oral Oral Oral Oral  Resp: 16 16 18 18   Height:      Weight:      SpO2: 96% 96% 94% 95%    General: NAD Cardiovascular: RRR Respiratory: CTA biL  Discharge Instructions     Medication List    TAKE these medications        acetaminophen 500 MG tablet  Commonly known as:   TYLENOL  Take 1,000 mg by mouth every 6 (six) hours as needed for moderate pain.     albuterol (2.5 MG/3ML) 0.083% nebulizer solution  Commonly known as:  PROVENTIL  Take 2.5 mg by nebulization every 6 (six) hours as needed for wheezing or shortness of breath.     albuterol 108 (90 BASE) MCG/ACT inhaler  Commonly known as:  PROVENTIL HFA;VENTOLIN HFA  Inhale 2 puffs into the lungs as needed for wheezing or shortness of breath.     aspirin 81 MG tablet  Take 81 mg by mouth daily.     ciprofloxacin 500 MG tablet  Commonly known as:  CIPRO  Take 1 tablet (500 mg total) by mouth 2 (two) times daily.     fenofibrate 54 MG tablet  TAKE 1 TABLET EVERY DAY     finasteride 5 MG tablet  Commonly known as:  PROSCAR  Take 1 tablet (5 mg total) by mouth daily.     GLUCOPHAGE 1000 MG tablet  Generic drug:  metFORMIN  Take 1,000 mg by mouth 2 (two) times daily.     Insulin Glargine 100 UNIT/ML Solostar Pen  Commonly known as:  LANTUS  Inject 80 Units into the skin daily at 10 pm. INJECT 25 - 50 UNITS SUBCUTANEOUSLY DAILY BASED ON TITRATION SCHEDULE     Liraglutide 18 MG/3ML Sopn  Inject 1.8 mg into the skin every morning.     lisinopril-hydrochlorothiazide 20-25 MG per tablet  Commonly known as:  PRINZIDE,ZESTORETIC  Take 1 tablet by mouth every morning.     metoprolol succinate 25 MG 24 hr tablet  Commonly known as:  TOPROL-XL  Take 1 tablet (25 mg total) by mouth daily.     metroNIDAZOLE 500 MG tablet  Commonly known as:  FLAGYL  Take 1 tablet (500 mg total) by mouth 3 (three) times daily.     multivitamin tablet  Take 1 tablet by mouth every evening.     omeprazole 20 MG capsule  Commonly known as:  PRILOSEC  Take 1 capsule (20 mg total) by mouth daily.     pravastatin 40 MG tablet  Commonly known as:  PRAVACHOL  Take 40 mg by mouth at bedtime.     Red Yeast Rice 600 MG Caps  Take 1 capsule by mouth daily.     tamsulosin 0.4 MG Caps capsule  Commonly known as:   FLOMAX  Take 0.4 mg by mouth daily after supper.     vitamin B-12 1000 MCG tablet  Commonly known as:  CYANOCOBALAMIN  Take 1,000 mcg by mouth every evening.     Vitamin D3 2000 UNITS Tabs  Take 1 capsule by mouth every evening.           Follow-up Information    Follow up with Elizabeth Palau, FNP. Schedule an appointment as soon as possible for a visit in 1 month.   Specialty:  Nurse Practitioner   Contact information:   [address] [place] [address] [phone]  Follow up with Kathi Ludwig, MD. Schedule an appointment as soon as possible for a visit in 1 week.   Specialty:  Urology   Contact information:   [address] [place] [address] [phone]       The results of significant diagnostics from this hospitalization (including imaging, microbiology, ancillary and laboratory) are listed below for reference.    Significant Diagnostic Studies: No results found.  Microbiology: Recent Results (from the past 240 hour(s))  C difficile quick scan w PCR reflex     Status: None   Collection Time: 01/11/15  9:42 AM  Result Value Ref Range Status   C Diff antigen NEGATIVE NEGATIVE Final   C Diff toxin NEGATIVE NEGATIVE Final   C Diff interpretation Negative for toxigenic C. difficile  Final     Labs: Basic Metabolic Panel:  Recent Labs Lab 01/08/15 1340 01/09/15 0533 01/10/15 0537  NA 132* 136 140  K 4.5 3.7 3.7  CL 99* 104 108  CO2 GLUCOSE 273* 114* 104*  BUN 37* 23* 12  CREATININE 1.90* 0.96 0.88  CALCIUM 8.1* 7.5* 7.8*  MG 1.3* 2.0  --    Liver Function Tests:  Recent Labs Lab 01/08/15 1340  AST 18  ALT 23  ALKPHOS 77  BILITOT 1.0  PROT 6.0*  ALBUMIN 3.3*    Recent Labs Lab 01/08/15 1340  LIPASE 10*   CBC:  Recent Labs Lab 01/08/15 1340 01/09/15 0533  WBC 9.8 9.1  HGB 15.9 13.4  HCT 46.6 40.1  MCV 86.5 85.9  PLT 280 283   CBG:  Recent Labs Lab 01/10/15 1151 01/10/15 1638  01/10/15 2147 01/11/15 0805 01/11/15 1153  GLUCAP 186* 183* 173* 132* 184*       Signed:  GHERGHE, COSTIN  [facility] 01/11/2015, 4:53 PM

## 2015-01-11 NOTE — Discharge Instructions (Signed)
Follow with Dr. Patsi Sears in 7 days  Continue Foley catheter until seen by him  Please get a complete blood count and chemistry panel checked by your Primary MD at your next visit, and again as instructed by your Primary MD. Please get your medications reviewed and adjusted by your Primary MD.  Please request your Primary MD to go over all Hospital Tests and Procedure/Radiological results at the follow up, please get all Hospital records sent to your Prim MD by signing hospital release before you go home.  If you had Pneumonia of Lung problems at the Hospital: Please get a 2 view Chest X ray done in 6-8 weeks after hospital discharge or sooner if instructed by your Primary MD.  If you have Congestive Heart Failure: Please call your Cardiologist or Primary MD anytime you have any of the following symptoms:  1) 3 pound weight gain in 24 hours or 5 pounds in 1 week  2) shortness of breath, with or without a dry hacking cough  3) swelling in the hands, feet or stomach  4) if you have to sleep on extra pillows at night in order to breathe  Follow cardiac low salt diet and 1.5 lit/day fluid restriction.  If you have diabetes Accuchecks 4 times/day, Once in AM empty stomach and then before each meal. Log in all results and show them to your primary doctor at your next visit. If any glucose reading is under 80 or above 300 call your primary MD immediately.  If you have Seizure/Convulsions/Epilepsy: Please do not drive, operate heavy machinery, participate in activities at heights or participate in high speed sports until you have seen by Primary MD or a Neurologist and advised to do so again.  If you had Gastrointestinal Bleeding: Please ask your Primary MD to check a complete blood count within one week of discharge or at your next visit. Your endoscopic/colonoscopic biopsies that are pending at the time of discharge, will also need to followed by your Primary MD.  Get Medicines reviewed and  adjusted. Please take all your medications with you for your next visit with your Primary MD  Please request your Primary MD to go over all hospital tests and procedure/radiological results at the follow up, please ask your Primary MD to get all Hospital records sent to his/her office.  If you experience worsening of your admission symptoms, develop shortness of breath, life threatening emergency, suicidal or homicidal thoughts you must seek medical attention immediately by calling 911 or calling your MD immediately  if symptoms less severe.  You must read complete instructions/literature along with all the possible adverse reactions/side effects for all the Medicines you take and that have been prescribed to you. Take any new Medicines after you have completely understood and accpet all the possible adverse reactions/side effects.   Do not drive or operate heavy machinery when taking Pain medications.   Do not take more than prescribed Pain, Sleep and Anxiety Medications  Special Instructions: If you have smoked or chewed Tobacco  in the last 2 yrs please stop smoking, stop any regular Alcohol  and or any Recreational drug use.  Wear Seat belts while driving.  Please note You were cared for by a hospitalist during your hospital stay. If you have any questions about your discharge medications or the care you received while you were in the hospital after you are discharged, you can call the unit and asked to speak with the hospitalist on call if the hospitalist that took  care of you is not available. Once you are discharged, your primary care physician will handle any further medical issues. Please note that NO REFILLS for any discharge medications will be authorized once you are discharged, as it is imperative that you return to your primary care physician (or establish a relationship with a primary care physician if you do not have one) for your aftercare needs so that they can reassess your need  for medications and monitor your lab values.  You can reach the hospitalist office at phone [phone] or fax [phone]   If you do not have a primary care physician, you can call [phone] for a physician referral.  Activity: As tolerated with Full fall precautions use walker/cane & assistance as needed  Diet: diabetic  Disposition Home

## 2015-01-12 LAB — GI PATHOGEN PANEL BY PCR, STOOL
C difficile toxin A/B: NOT DETECTED
CRYPTOSPORIDIUM BY PCR: NOT DETECTED
Campylobacter by PCR: NOT DETECTED
E COLI (ETEC) LT/ST: NOT DETECTED
E COLI (STEC): NOT DETECTED
E COLI 0157 BY PCR: NOT DETECTED
G LAMBLIA BY PCR: NOT DETECTED
Norovirus GI/GII: NOT DETECTED
Rotavirus A by PCR: NOT DETECTED
Salmonella by PCR: NOT DETECTED
Shigella by PCR: NOT DETECTED

## 2015-03-10 ENCOUNTER — Other Ambulatory Visit: Payer: Self-pay | Admitting: Urology

## 2015-03-14 ENCOUNTER — Other Ambulatory Visit: Payer: Self-pay | Admitting: Urology

## 2015-04-29 NOTE — Patient Instructions (Addendum)
Todd Krause  04/29/2015   Your procedure is scheduled on: 05-09-15  [facility]  [facility] to 3rd floor to  Short Stay Center at 830 AM.  Call this number if you have problems the morning of surgery [phone]   Remember: ONLY 1 PERSON MAY GO WITH YOU TO SHORT STAY TO GET  READY MORNING OF YOUR SURGERY.  Do not eat food or drink liquids :After Midnight.     Take these medicines the morning of surgery with A SIP OF WATER: albuterol inhaler if needed and bring inhaler, albuterol nebulizer if needed,  Tamulosin (Flomax) Omeprazole (Prilosec) take 1/2 dose pm Lantus insulin on 05-08-15 at Bedtime DO NOT TAKE ANY DIABETIC MEDICATIONS DAY OF YOUR SURGERY                               You may not have any metal on your body including hair pins and              piercings  Do not wear jewelry, make-up, lotions, powders or perfumes, deodorant             Do not wear nail polish.  Do not shave  48 hours prior to surgery.              Men may shave face and neck.   Do not bring valuables to the hospital. [facility] IS NOT             RESPONSIBLE   FOR VALUABLES.  Contacts, dentures or bridgework may not be worn into surgery.  Leave suitcase in the car. After surgery it may be brought to your room.     Patients discharged the day of surgery will not be allowed to drive home.  Name and phone number of your driver:  Special Instructions: N/A              Please read over the following fact sheets you were given: _____________________________________________________________________             [facility] - Preparing for Surgery Before surgery, you can play an important role.  Because skin is not sterile, your skin needs to be as free of germs as possible.  You can reduce the number of germs on your skin by washing with CHG (chlorahexidine gluconate) soap before surgery.  CHG is an antiseptic cleaner which kills germs and  bonds with the skin to continue killing germs even after washing. Please DO NOT use if you have an allergy to CHG or antibacterial soaps.  If your skin becomes reddened/irritated stop using the CHG and inform your nurse when you arrive at Short Stay. Do not shave (including legs and underarms) for at least 48 hours prior to the first CHG shower.  You may shave your face/neck. Please follow these instructions carefully:  1.  Shower with CHG Soap the night before surgery and the  morning of Surgery.  2.  If you choose to wash your hair, wash your hair first as usual with your  normal  shampoo.  3.  After you shampoo, rinse your hair and body thoroughly to remove the  shampoo.  4.  Use CHG as you would any other liquid soap.  You can apply chg directly  to the skin and wash                       Gently with a scrungie or clean washcloth.  5.  Apply the CHG Soap to your body ONLY FROM THE NECK DOWN.   Do not use on face/ open                           Wound or open sores. Avoid contact with eyes, ears mouth and genitals (private parts).                       Wash face,  Genitals (private parts) with your normal soap.             6.  Wash thoroughly, paying special attention to the area where your surgery  will be performed.  7.  Thoroughly rinse your body with warm water from the neck down.  8.  DO NOT shower/wash with your normal soap after using and rinsing off  the CHG Soap.                9.  Pat yourself dry with a clean towel.            10.  Wear clean pajamas.            11.  Place clean sheets on your bed the night of your first shower and do not  sleep with pets. Day of Surgery : Do not apply any lotions/deodorants the morning of surgery.  Please wear clean clothes to the hospital/surgery center.  FAILURE TO FOLLOW THESE INSTRUCTIONS MAY RESULT IN THE CANCELLATION OF YOUR SURGERY PATIENT SIGNATURE_________________________________  NURSE  SIGNATURE__________________________________  ________________________________________________________________________

## 2015-04-29 NOTE — Progress Notes (Signed)
ekg 01-08-15 epic

## 2015-05-03 ENCOUNTER — Encounter (HOSPITAL_COMMUNITY)
Admission: RE | Admit: 2015-05-03 | Discharge: 2015-05-03 | Disposition: A | Discharge status: Home / Self Care | Payer: Medicare HMO | Source: Ambulatory Visit | Attending: Urology | Admitting: Urology

## 2015-05-03 ENCOUNTER — Encounter (HOSPITAL_COMMUNITY): Payer: Self-pay

## 2015-05-03 DIAGNOSIS — Z01818 Encounter for other preprocedural examination: Secondary | ICD-10-CM | POA: Insufficient documentation

## 2015-05-03 DIAGNOSIS — N400 Enlarged prostate without lower urinary tract symptoms: Secondary | ICD-10-CM | POA: Diagnosis not present

## 2015-05-03 HISTORY — DX: Unspecified chronic bronchitis: J42

## 2015-05-03 LAB — BASIC METABOLIC PANEL
ANION GAP: 10 (ref 5–15)
BUN: 19 mg/dL (ref 6–20)
CHLORIDE: 101 mmol/L (ref 101–111)
CO2: 29 mmol/L (ref 22–32)
Calcium: 9.6 mg/dL (ref 8.9–10.3)
Creatinine, Ser: 0.81 mg/dL (ref 0.61–1.24)
GFR calc non Af Amer: >60 mL/min (ref >60)
GLUCOSE: 211 mg/dL — AB (ref 65–99)
POTASSIUM: 4.4 mmol/L (ref 3.5–5.1)
Sodium: 140 mmol/L (ref 135–145)

## 2015-05-03 LAB — CBC
HEMATOCRIT: 40.6 % (ref 39.0–52.0)
HEMOGLOBIN: 13.7 g/dL (ref 13.0–17.0)
MCH: 29.2 pg (ref 26.0–34.0)
MCHC: 33.7 g/dL (ref 30.0–36.0)
MCV: 86.6 fL (ref 78.0–100.0)
Platelets: 316 10*3/uL (ref 150–400)
RBC: 4.69 MIL/uL (ref 4.22–5.81)
RDW: 13.1 % (ref 11.5–15.5)
WBC: 9.6 10*3/uL (ref 4.0–10.5)

## 2015-05-04 LAB — HEMOGLOBIN A1C
HEMOGLOBIN A1C: 7.2 % — AB (ref 4.8–5.6)
Mean Plasma Glucose: 160 mg/dL

## 2015-05-09 ENCOUNTER — Inpatient Hospital Stay (HOSPITAL_COMMUNITY): Payer: Medicare HMO | Admitting: Registered Nurse

## 2015-05-09 ENCOUNTER — Observation Stay (HOSPITAL_COMMUNITY)
Admission: RE | Admit: 2015-05-09 | Discharge: 2015-05-10 | Disposition: A | Discharge status: Home / Self Care | Payer: Medicare HMO | Source: Ambulatory Visit | Attending: Urology | Admitting: Urology

## 2015-05-09 ENCOUNTER — Encounter (HOSPITAL_COMMUNITY)
Admission: RE | Disposition: A | Discharge status: Home / Self Care | Payer: Self-pay | Source: Ambulatory Visit | Attending: Urology

## 2015-05-09 ENCOUNTER — Encounter (HOSPITAL_COMMUNITY): Payer: Self-pay | Admitting: *Deleted

## 2015-05-09 DIAGNOSIS — R338 Other retention of urine: Secondary | ICD-10-CM | POA: Insufficient documentation

## 2015-05-09 DIAGNOSIS — Z7982 Long term (current) use of aspirin: Secondary | ICD-10-CM | POA: Insufficient documentation

## 2015-05-09 DIAGNOSIS — M1990 Unspecified osteoarthritis, unspecified site: Secondary | ICD-10-CM | POA: Insufficient documentation

## 2015-05-09 DIAGNOSIS — N138 Other obstructive and reflux uropathy: Secondary | ICD-10-CM | POA: Diagnosis not present

## 2015-05-09 DIAGNOSIS — Z79899 Other long term (current) drug therapy: Secondary | ICD-10-CM | POA: Insufficient documentation

## 2015-05-09 DIAGNOSIS — K219 Gastro-esophageal reflux disease without esophagitis: Secondary | ICD-10-CM | POA: Insufficient documentation

## 2015-05-09 DIAGNOSIS — R351 Nocturia: Secondary | ICD-10-CM | POA: Diagnosis not present

## 2015-05-09 DIAGNOSIS — Z794 Long term (current) use of insulin: Secondary | ICD-10-CM | POA: Insufficient documentation

## 2015-05-09 DIAGNOSIS — R350 Frequency of micturition: Secondary | ICD-10-CM | POA: Diagnosis not present

## 2015-05-09 DIAGNOSIS — R3915 Urgency of urination: Secondary | ICD-10-CM | POA: Diagnosis present

## 2015-05-09 DIAGNOSIS — E119 Type 2 diabetes mellitus without complications: Secondary | ICD-10-CM | POA: Diagnosis not present

## 2015-05-09 DIAGNOSIS — R972 Elevated prostate specific antigen [PSA]: Secondary | ICD-10-CM | POA: Diagnosis not present

## 2015-05-09 DIAGNOSIS — G4730 Sleep apnea, unspecified: Secondary | ICD-10-CM | POA: Insufficient documentation

## 2015-05-09 DIAGNOSIS — Z87891 Personal history of nicotine dependence: Secondary | ICD-10-CM | POA: Diagnosis not present

## 2015-05-09 DIAGNOSIS — I10 Essential (primary) hypertension: Secondary | ICD-10-CM | POA: Insufficient documentation

## 2015-05-09 DIAGNOSIS — R3916 Straining to void: Secondary | ICD-10-CM | POA: Insufficient documentation

## 2015-05-09 DIAGNOSIS — K759 Inflammatory liver disease, unspecified: Secondary | ICD-10-CM | POA: Insufficient documentation

## 2015-05-09 DIAGNOSIS — N401 Enlarged prostate with lower urinary tract symptoms: Secondary | ICD-10-CM | POA: Diagnosis not present

## 2015-05-09 DIAGNOSIS — N400 Enlarged prostate without lower urinary tract symptoms: Secondary | ICD-10-CM

## 2015-05-09 HISTORY — PX: TRANSURETHRAL RESECTION OF PROSTATE: SHX73

## 2015-05-09 LAB — GLUCOSE, CAPILLARY
GLUCOSE-CAPILLARY: 90 mg/dL (ref 65–99)
Glucose-Capillary: 127 mg/dL — ABNORMAL HIGH (ref 65–99)
Glucose-Capillary: 107 mg/dL — ABNORMAL HIGH (ref 65–99)

## 2015-05-09 SURGERY — TURP (TRANSURETHRAL RESECTION OF PROSTATE)
Anesthesia: General

## 2015-05-09 MED ORDER — CEFAZOLIN SODIUM-DEXTROSE 2-3 GM-% IV SOLR
INTRAVENOUS | Status: AC
  Filled 2015-05-09: qty 50

## 2015-05-09 MED ORDER — ACETAMINOPHEN 500 MG PO TABS
1000.0000 mg | ORAL_TABLET | Freq: Four times a day (QID) | ORAL | Status: DC
  Administered 2015-05-09 – 2015-05-10 (×3): 1000 mg via ORAL
  Filled 2015-05-09 (×3): qty 2

## 2015-05-09 MED ORDER — OXYCODONE-ACETAMINOPHEN 5-325 MG PO TABS: 1.0000 | ORAL_TABLET | ORAL | Status: DC | PRN

## 2015-05-09 MED ORDER — BACITRACIN-NEOMYCIN-POLYMYXIN 400-5-5000 EX OINT
1.0000 "application " | TOPICAL_OINTMENT | Freq: Three times a day (TID) | CUTANEOUS | Status: DC | PRN
  Administered 2015-05-10: 1 via TOPICAL
  Filled 2015-05-09 (×2): qty 1

## 2015-05-09 MED ORDER — FENTANYL CITRATE (PF) 100 MCG/2ML IJ SOLN
INTRAMUSCULAR | Status: AC
  Filled 2015-05-09: qty 2

## 2015-05-09 MED ORDER — OXYCODONE HCL 5 MG PO TABS: 5.0000 mg | ORAL_TABLET | ORAL | Status: DC | PRN

## 2015-05-09 MED ORDER — ACETAMINOPHEN 650 MG RE SUPP
650.0000 mg | RECTAL | Status: DC | PRN
Start: 2015-05-10 — End: 2015-05-10

## 2015-05-09 MED ORDER — FENTANYL CITRATE (PF) 100 MCG/2ML IJ SOLN
INTRAMUSCULAR | Status: DC | PRN
  Administered 2015-05-09: 25 ug via INTRAVENOUS
  Administered 2015-05-09 (×2): 50 ug via INTRAVENOUS
  Administered 2015-05-09: 25 ug via INTRAVENOUS

## 2015-05-09 MED ORDER — SODIUM CHLORIDE 0.9 % IR SOLN
Status: DC | PRN
  Administered 2015-05-09: 21000 mL via INTRAVESICAL

## 2015-05-09 MED ORDER — ALBUTEROL SULFATE (2.5 MG/3ML) 0.083% IN NEBU: 3.0000 mL | INHALATION_SOLUTION | RESPIRATORY_TRACT | Status: DC | PRN

## 2015-05-09 MED ORDER — ACETAMINOPHEN 500 MG PO TABS: 1000.0000 mg | ORAL_TABLET | Freq: Four times a day (QID) | ORAL | Status: DC

## 2015-05-09 MED ORDER — PROPOFOL 10 MG/ML IV BOLUS
INTRAVENOUS | Status: AC
Start: 2015-05-09 — End: 2015-05-09
  Filled 2015-05-09: qty 20

## 2015-05-09 MED ORDER — EPHEDRINE SULFATE 50 MG/ML IJ SOLN
INTRAMUSCULAR | Status: DC | PRN
  Administered 2015-05-09 (×3): 5 mg via INTRAVENOUS

## 2015-05-09 MED ORDER — METFORMIN HCL 500 MG PO TABS
1000.0000 mg | ORAL_TABLET | Freq: Two times a day (BID) | ORAL | Status: DC
  Administered 2015-05-09 – 2015-05-10 (×2): 1000 mg via ORAL
  Filled 2015-05-09 (×2): qty 2

## 2015-05-09 MED ORDER — ONDANSETRON HCL 4 MG/2ML IJ SOLN
INTRAMUSCULAR | Status: AC
  Filled 2015-05-09: qty 2

## 2015-05-09 MED ORDER — ONDANSETRON HCL 4 MG/2ML IJ SOLN
INTRAMUSCULAR | Status: DC | PRN
  Administered 2015-05-09: 4 mg via INTRAVENOUS

## 2015-05-09 MED ORDER — ADULT MULTIVITAMIN W/MINERALS CH
1.0000 | ORAL_TABLET | Freq: Every evening | ORAL | Status: DC
  Administered 2015-05-09: 1 via ORAL
  Filled 2015-05-09 (×2): qty 1

## 2015-05-09 MED ORDER — MIDAZOLAM HCL 2 MG/2ML IJ SOLN
INTRAMUSCULAR | Status: AC
  Filled 2015-05-09: qty 2

## 2015-05-09 MED ORDER — LACTATED RINGERS IV SOLN
INTRAVENOUS | Status: DC
  Administered 2015-05-09: 12:00:00 via INTRAVENOUS
  Administered 2015-05-09: 1000 mL via INTRAVENOUS

## 2015-05-09 MED ORDER — METOPROLOL SUCCINATE ER 25 MG PO TB24
25.0000 mg | ORAL_TABLET | Freq: Every evening | ORAL | Status: DC
  Administered 2015-05-09: 25 mg via ORAL
  Filled 2015-05-09: qty 1

## 2015-05-09 MED ORDER — PROPOFOL 10 MG/ML IV BOLUS
INTRAVENOUS | Status: DC | PRN
  Administered 2015-05-09: 40 mg via INTRAVENOUS
  Administered 2015-05-09: 160 mg via INTRAVENOUS

## 2015-05-09 MED ORDER — SODIUM CHLORIDE 0.45 % IV SOLN
INTRAVENOUS | Status: DC
  Administered 2015-05-09: 16:00:00 via INTRAVENOUS

## 2015-05-09 MED ORDER — SULFAMETHOXAZOLE-TRIMETHOPRIM 800-160 MG PO TABS
1.0000 | ORAL_TABLET | Freq: Two times a day (BID) | ORAL | Status: DC
  Administered 2015-05-09 – 2015-05-10 (×2): 1 via ORAL
  Filled 2015-05-09 (×2): qty 1

## 2015-05-09 MED ORDER — MIDAZOLAM HCL 5 MG/5ML IJ SOLN
INTRAMUSCULAR | Status: DC | PRN
  Administered 2015-05-09: 2 mg via INTRAVENOUS

## 2015-05-09 MED ORDER — HYDROMORPHONE HCL 1 MG/ML IJ SOLN
0.2500 mg | INTRAMUSCULAR | Status: DC | PRN
  Administered 2015-05-09 (×4): 0.25 mg via INTRAVENOUS

## 2015-05-09 MED ORDER — FENOFIBRATE 54 MG PO TABS
54.0000 mg | ORAL_TABLET | Freq: Every day | ORAL | Status: DC
  Administered 2015-05-10: 54 mg via ORAL
  Filled 2015-05-09 (×2): qty 1

## 2015-05-09 MED ORDER — FENTANYL CITRATE (PF) 100 MCG/2ML IJ SOLN
25.0000 ug | INTRAMUSCULAR | Status: DC | PRN
  Administered 2015-05-09 (×3): 50 ug via INTRAVENOUS

## 2015-05-09 MED ORDER — CEFAZOLIN SODIUM-DEXTROSE 2-3 GM-% IV SOLR
2.0000 g | INTRAVENOUS | Status: AC
  Administered 2015-05-09: 2 g via INTRAVENOUS

## 2015-05-09 MED ORDER — VITAMIN D 1000 UNITS PO TABS
2000.0000 [IU] | ORAL_TABLET | Freq: Every evening | ORAL | Status: DC
  Administered 2015-05-09: 2000 [IU] via ORAL
  Filled 2015-05-09: qty 2

## 2015-05-09 MED ORDER — LISINOPRIL 20 MG PO TABS
20.0000 mg | ORAL_TABLET | Freq: Every day | ORAL | Status: DC
  Administered 2015-05-10: 20 mg via ORAL
  Filled 2015-05-09: qty 1

## 2015-05-09 MED ORDER — INSULIN ASPART 100 UNIT/ML ~~LOC~~ SOLN
4.0000 [IU] | Freq: Three times a day (TID) | SUBCUTANEOUS | Status: DC
  Administered 2015-05-10: 4 [IU] via SUBCUTANEOUS

## 2015-05-09 MED ORDER — HYDROCHLOROTHIAZIDE 25 MG PO TABS
25.0000 mg | ORAL_TABLET | Freq: Every day | ORAL | Status: DC
  Administered 2015-05-10: 25 mg via ORAL
  Filled 2015-05-09: qty 1

## 2015-05-09 MED ORDER — HYDROMORPHONE HCL 1 MG/ML IJ SOLN
INTRAMUSCULAR | Status: AC
  Filled 2015-05-09: qty 1

## 2015-05-09 MED ORDER — SODIUM CHLORIDE 0.9 % IR SOLN: 3000.0000 mL | Status: DC

## 2015-05-09 MED ORDER — LISINOPRIL-HYDROCHLOROTHIAZIDE 20-25 MG PO TABS: 1.0000 | ORAL_TABLET | Freq: Every morning | ORAL | Status: DC

## 2015-05-09 MED ORDER — INSULIN GLARGINE 100 UNIT/ML ~~LOC~~ SOLN
80.0000 [IU] | Freq: Every day | SUBCUTANEOUS | Status: DC
  Administered 2015-05-09: 80 [IU] via SUBCUTANEOUS
  Filled 2015-05-09 (×2): qty 0.8

## 2015-05-09 MED ORDER — MEPERIDINE HCL 50 MG/ML IJ SOLN: 6.2500 mg | INTRAMUSCULAR | Status: DC | PRN

## 2015-05-09 MED ORDER — PROMETHAZINE HCL 25 MG/ML IJ SOLN: 6.2500 mg | INTRAMUSCULAR | Status: DC | PRN

## 2015-05-09 MED ORDER — LACTATED RINGERS IV SOLN
INTRAVENOUS | Status: DC
  Administered 2015-05-09: 16:00:00 via INTRAVENOUS

## 2015-05-09 MED ORDER — ONDANSETRON HCL 4 MG/2ML IJ SOLN
4.0000 mg | INTRAMUSCULAR | Status: DC | PRN
Start: 2015-05-09 — End: 2015-05-10

## 2015-05-09 MED ORDER — SODIUM CHLORIDE 0.45 % IV SOLN: INTRAVENOUS | Status: DC

## 2015-05-09 MED ORDER — LIDOCAINE HCL (CARDIAC) 20 MG/ML IV SOLN
INTRAVENOUS | Status: AC
  Filled 2015-05-09: qty 5

## 2015-05-09 MED ORDER — ACETAMINOPHEN 500 MG PO TABS: 1000.0000 mg | ORAL_TABLET | Freq: Four times a day (QID) | ORAL | Status: DC | PRN

## 2015-05-09 MED ORDER — FENTANYL CITRATE (PF) 250 MCG/5ML IJ SOLN
INTRAMUSCULAR | Status: AC
  Filled 2015-05-09: qty 5

## 2015-05-09 MED ORDER — INSULIN ASPART 100 UNIT/ML ~~LOC~~ SOLN: 0.0000 [IU] | Freq: Every day | SUBCUTANEOUS | Status: DC

## 2015-05-09 MED ORDER — ALBUTEROL SULFATE (2.5 MG/3ML) 0.083% IN NEBU: 2.5000 mg | INHALATION_SOLUTION | Freq: Four times a day (QID) | RESPIRATORY_TRACT | Status: DC | PRN

## 2015-05-09 MED ORDER — HYDROMORPHONE HCL 1 MG/ML IJ SOLN: 0.5000 mg | INTRAMUSCULAR | Status: DC | PRN

## 2015-05-09 MED ORDER — LIRAGLUTIDE 18 MG/3ML ~~LOC~~ SOPN
1.8000 mg | PEN_INJECTOR | Freq: Every morning | SUBCUTANEOUS | Status: DC
  Filled 2015-05-09 (×2): qty 3

## 2015-05-09 MED ORDER — LIDOCAINE HCL (CARDIAC) 20 MG/ML IV SOLN
INTRAVENOUS | Status: DC | PRN
  Administered 2015-05-09: 75 mg via INTRAVENOUS

## 2015-05-09 MED ORDER — SENNOSIDES-DOCUSATE SODIUM 8.6-50 MG PO TABS
2.0000 | ORAL_TABLET | Freq: Every day | ORAL | Status: DC
  Administered 2015-05-09: 2 via ORAL
  Filled 2015-05-09: qty 2

## 2015-05-09 MED ORDER — ACETAMINOPHEN 325 MG PO TABS: 650.0000 mg | ORAL_TABLET | ORAL | Status: DC | PRN

## 2015-05-09 MED ORDER — KETOROLAC TROMETHAMINE 15 MG/ML IJ SOLN
15.0000 mg | Freq: Four times a day (QID) | INTRAMUSCULAR | Status: DC
  Administered 2015-05-09 – 2015-05-10 (×3): 15 mg via INTRAVENOUS
  Filled 2015-05-09 (×3): qty 1

## 2015-05-09 MED ORDER — PRAVASTATIN SODIUM 40 MG PO TABS
40.0000 mg | ORAL_TABLET | Freq: Every day | ORAL | Status: DC
  Administered 2015-05-09: 40 mg via ORAL
  Filled 2015-05-09: qty 1

## 2015-05-09 MED ORDER — INSULIN ASPART 100 UNIT/ML ~~LOC~~ SOLN
0.0000 [IU] | Freq: Three times a day (TID) | SUBCUTANEOUS | Status: DC
Start: 2015-05-09 — End: 2015-05-10
  Administered 2015-05-10: 2 [IU] via SUBCUTANEOUS

## 2015-05-09 SURGICAL SUPPLY — 18 items
BAG URINE DRAINAGE (UROLOGICAL SUPPLIES) IMPLANT
BAG URO CATCHER STRL LF (MISCELLANEOUS) ×3 IMPLANT
CATH FOLEY 3WAY 30CC 24FR (CATHETERS) ×2
CATH HEMA 3WAY 30CC 22FR COUDE (CATHETERS) ×3 IMPLANT
CATH URO 16X24FR 3W FL PS (CATHETERS) ×1 IMPLANT
CLOTH BEACON ORANGE TIMEOUT ST (SAFETY) ×3 IMPLANT
GLOVE BIOGEL M STRL SZ7.5 (GLOVE) ×15 IMPLANT
GOWN STRL REUS W/TWL LRG LVL3 (GOWN DISPOSABLE) ×9 IMPLANT
GOWN STRL REUS W/TWL XL LVL3 (GOWN DISPOSABLE) IMPLANT
HOLDER FOLEY CATH W/STRAP (MISCELLANEOUS) IMPLANT
KIT ASPIRATION TUBING (SET/KITS/TRAYS/PACK) IMPLANT
LOOP CUT BIPOLAR 24F LRG (ELECTROSURGICAL) ×3 IMPLANT
MANIFOLD NEPTUNE II (INSTRUMENTS) ×3 IMPLANT
PACK CYSTO (CUSTOM PROCEDURE TRAY) ×3 IMPLANT
SYR 30ML LL (SYRINGE) IMPLANT
SYRINGE IRR TOOMEY STRL 70CC (SYRINGE) ×3 IMPLANT
TUBING CONNECTING 10 (TUBING) ×2 IMPLANT
TUBING CONNECTING 10' (TUBING) ×1

## 2015-05-09 NOTE — Progress Notes (Signed)
PHARMACY NOTE -  ANTIBIOTIC RENAL DOSE ADJUSTMENT    Request received for Pharmacy to assist with antibiotic renal dose adjustment.   Patient has been initiated on Bactrim for surgical prophylaxis.  SCr 0.81, estimated CrCl 100 ml/min  Current dosage is appropriate and need for further dosage adjustment appears unlikely at present.  Will sign off at this time.  Please reconsult if a change in clinical status warrants re-evaluation of dosage.  Bernadene Personrew Shacoya Burkhammer, PharmD, BCPS Pager: [phone] 05/09/2015, 6:53 PM

## 2015-05-09 NOTE — Interval H&P Note (Signed)
History and Physical Interval Note:  05/09/2015 10:48 AM  Todd Krause  has presented today for surgery, with the diagnosis of BPH  The various methods of treatment have been discussed with the patient and family. After consideration of risks, benefits and other options for treatment, the patient has consented to  Procedure(s): CYSTOSCOPY TRANSURETHRAL RESECTION OF THE PROSTATE (TURP) (N/A) as a surgical intervention .  The patient's history has been reviewed, patient examined, no change in status, stable for surgery.  Todd have reviewed the patient's chart and labs.  Questions were answered to the patient's satisfaction.     Todd Krause Todd Krause Goals: Abilility to void with lower IPSS Liklihood of success: Excellent. Discussed surgery in office with patient.  Disability: overnight hospital stay. 6 weeks recovery.( 90%),  Alternate Rx: Failed medication. iscusedDisability

## 2015-05-09 NOTE — Anesthesia Preprocedure Evaluation (Addendum)
Anesthesia Evaluation  Patient identified by MRN, date of birth, ID band Patient awake    Reviewed: Allergy & Precautions, NPO status , Patient's Chart, lab work & pertinent test results  Airway Mallampati: III   Neck ROM: Full    Dental  (+) Dental Advisory Given, Teeth Intact   Pulmonary sleep apnea , former smoker,    breath sounds clear to auscultation       Cardiovascular hypertension, Pt. on medications  Rhythm:Regular  ECHO 2011 EF 60%, mild MR and AR, grade 1 diastolic dys   Neuro/Psych    GI/Hepatic Neg liver ROS, GERD  Medicated and Controlled,  Endo/Other  diabetes, Well Controlled, Type 2, Insulin Dependent, Oral Hypoglycemic Agents  Renal/GU negative Renal ROS     Musculoskeletal  (+) Arthritis ,   Abdominal (+)  Abdomen: soft.    Peds  Hematology negative hematology ROS (+)   Anesthesia Other Findings   Reproductive/Obstetrics                           Lab Results  Component Value Date   WBC 9.6 05/03/2015   HGB 13.7 05/03/2015   HCT 40.6 05/03/2015   MCV 86.6 05/03/2015   PLT 316 05/03/2015   Lab Results  Component Value Date   CREATININE 0.81 05/03/2015   BUN 19 05/03/2015   NA 140 05/03/2015   K 4.4 05/03/2015   CL 101 05/03/2015   CO2 29 05/03/2015    Anesthesia Physical  Anesthesia Plan  ASA: III  Anesthesia Plan: General   Post-op Pain Management:    Induction: Intravenous  Airway Management Planned: LMA  Additional Equipment:   Intra-op Plan:   Post-operative Plan:   Informed Consent: I have reviewed the patients History and Physical, chart, labs and discussed the procedure including the risks, benefits and alternatives for the proposed anesthesia with the patient or authorized representative who has indicated his/her understanding and acceptance.   Dental advisory given  Plan Discussed with: CRNA  Anesthesia Plan Comments:         Anesthesia Quick Evaluation

## 2015-05-09 NOTE — Transfer of Care (Signed)
Immediate Anesthesia Transfer of Care Note  Patient: Mardi MainlandChristopher M Dacquisto  Procedure(s) Performed: Procedure(s): CYSTOSCOPY TRANSURETHRAL RESECTION OF THE PROSTATE (TURP) (N/A)  Patient Location: PACU  Anesthesia Type:General  Level of Consciousness: awake, alert , oriented and patient cooperative  Airway & Oxygen Therapy: Patient Spontanous Breathing and Patient connected to face mask oxygen  Post-op Assessment: Report given to RN, Post -op Vital signs reviewed and stable and Patient moving all extremities  Post vital signs: Reviewed and stable  Last Vitals:  Filed Vitals:   05/09/15 0806  BP: 144/70  Pulse: 52  Temp: 36.4 C  Resp: 18    Complications: No apparent anesthesia complications

## 2015-05-09 NOTE — H&P (Signed)
Reason For Visit Cystoscopy, prostate u/s/review uds   Active Problems Problems  1. Benign localized hyperplasia of prostate with urinary obstruction and lower urinary tract  symptoms (N40.1,N13.9) 2. Elevated prostate specific antigen (PSA) (R97.20) 3. 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 He has been treated with finasteride, and double dose tamsulosin. IPSS= 14.  He returns today for a cystoscopy, prostate u/s & to review urodynamic results.    He complains of numbness of the bottom of his feet, but no burning or stinging sensation. He has GERD, but no other known digestive problems. The patient has had urinary retention 2 at September 2016, with 1100 cc residual, and 1000 cc residual.   Note the past history of malignant melanoma, as well as hypertension and hepatitis.    Examination showed normal sphincter tone, with 4+ benign prostate. Post void residual in the office was 225 cc. 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    The first sensation of filling is at 130 cc and normal desire occurs at 162 cc. There is a strong desire to void at 275 cc. The patient has a bladder contraction, with a catheter induced bladder contraction at 35 cc. He does not have a sensation, however at this time. He has a maximum unstable bladder contraction pressure of only 7 cm of water and no urinary leakage occurs. He is able to inhibit this unstable contraction.    The patient does not leak for Valsalva leak point pressure, even with an abdominal pressure generation of 88 7 m of water pressure.    Pressure flow study shows that the patient is able  to void 300 cc, with a maximum flow rate of 12 cc/s, and a detrusor pressure at maximum flow of 43 cm of water pressure, and maximum detrusor pressure of 52 cm of water pressure. PVRs 155 cc.    VCUG shows no bony abnormality, and no reflux. Trabeculation is noted.    The patient has a maximum capacity of approximately 455 cc. Although there is low amplitude instability, it is not significant and no leakage occurs. This catheter induced only. He does have difficulty initiating a stream. This relates to the hesitancy that he notes at home. His stream is weak, but he is able to get a voluntary contraction and get his flow started. He would appear to have an obstructed flow pattern. There was no reflux noted.   Past Medical History Problems  1. History of arthritis (Z87.39) 2. History of diabetes mellitus (Z86.39) 3. History of heartburn (Z87.898) 4. History of hepatitis (Z86.19) 5. History of hypertension (Z86.79) 6. History of malignant melanoma (R60.454)  Surgical History Problems  1. History of Biopsy Skin 2. History of Ear Surgery 3. History of Needle Biopsy Of Prostate  Current Meds 1. Albuterol Sulfate HFA 108 MCG/ACT AERS; AS NEEDED;  Therapy: (Recorded:14Sep2016) to Recorded 2. Aspirin 81 MG Oral Tablet Chewable;  Therapy: (Recorded:26Apr2016) to Recorded 3. Fenofibrate 54 MG Oral Tablet;  Therapy: (Recorded:26Apr2016) to Recorded 4. Lantus 100 UNIT/ML Subcutaneous Solution;  Therapy: (Recorded:26Apr2016) to Recorded 5. Lisinopril-Hydrochlorothiazide 20-25 MG Oral Tablet;  Therapy: (Recorded:26Apr2016) to Recorded 6. MetFORMIN HCl - 1000 MG Oral Tablet;  Therapy: (Recorded:26Apr2016) to Recorded 7. Metoprolol Succinate ER 25 MG Oral  Tablet Extended Release 24 Hour;  Therapy: (Recorded:26Apr2016) to Recorded 8. Omeprazole 20 MG Oral Capsule Delayed Release;  Therapy: (Recorded:26Apr2016) to Recorded 9. Red Yeast Rice 600 MG Oral Capsule;  Therapy: (Recorded:26Apr2016)  to Recorded 10. Tamsulosin HCl - 0.4 MG Oral Capsule; PT DECIDED HE WOULD TAKE 2 in AM, 2 in PM   without asking MD;   Therapy: (Recorded:14Sep2016) to Recorded 11. Tamsulosin HCl - 0.4 MG Oral Capsule; TAKE TWO CAPSULES BY MOUTH EVERY DAY   AT BEDTIME;   Therapy: 15Jun2016 to (Evaluate:10Jun2017)  Requested for: 15Jun2016; Last   Rx:15Jun2016 Ordered 12. Victoza 18 MG/3ML SOLN;   Therapy: (Recorded:26Apr2016) to Recorded  Allergies Medication  1. Codeine Derivatives Non-Medication  2. Latex  Family History Problems  1. Family history of diabetes mellitus (Z83.3) : Mother 2. Family history of myocardial infarction (Z82.49) : Father  Social History Problems  1. Denied: History of Alcohol use 2. Denied: History of Caffeine use 3. Father deceased   dec heart attack 4. Former smoker [phone])   quit 8 years ago, smoked 2 ppd x 40 years 5. Married 6. Mother deceased   dec age 31 yo uterine cancer 7. Number of children   2 sons ages 88yo, 66 yo 51. Occupation   part time courier for parts  Review of Systems Genitourinary, constitutional, skin, eye, otolaryngeal, hematologic/lymphatic, cardiovascular, pulmonary, endocrine, musculoskeletal, gastrointestinal, neurological and psychiatric system(s) were reviewed and pertinent findings if present are noted and are otherwise negative.  Genitourinary: urinary frequency, feelings of urinary urgency, nocturia, difficulty starting the urinary stream and initiating urination requires straining.    Vitals Vital Signs [Data Includes: Last 1 Day]  Recorded: 09Nov2016 03:57PM  Blood Pressure: 141 / 73 Temperature: 97 F Heart Rate: 72  Physical Exam Constitutional: Well nourished and well developed . No acute distress. The patient appears well hydrated.  ENT:. The ears and nose are normal in appearance.  Neck: The appearance of the neck is normal.  Pulmonary:. No accessory muscle use.  Cardiovascular:. No peripheral edema.   Abdomen: The abdomen is mildly obese, but not distended. The abdomen is soft and nontender. No masses are palpated. No CVA tenderness. Bowel sounds are normal. No hernias are palpable. No hepatosplenomegaly noted.  Rectal: Rectal exam demonstrates normal sphincter tone. Estimated prostate size is 4+. The left seminal vesicle is nonpalpable. The right seminal vesicle is nonpalpable.  Genitourinary: The penis is circumcised.  Lymphatics: The femoral and inguinal nodes are not enlarged or tender.  Skin: Normal skin turgor, no visible rash and no visible skin lesions.    Results/Data Selected Results  URINE CULTURE 02Nov2016 08:11AM Jethro Bolus  SOURCE : CATHED SPECIMEN TYPE: CATH URINE   Test Name Result Flag Reference  CULTURE, URINE Culture, Urine    ===== COLONY COUNT: =====  >=100,000 COLONIES/ML   FINAL REPORT: STAPHYLOCOCCUS SPECIES (COAGULASE NEGATIVE)  RIFAMPIN AND GENTAMICIN SHOULD NOT BE USED AS SINGLE DRUGS FOR TREATMENT OF STAPH INFECTIONS.   SENSITIVITY FOR: STAPHYLOCOCCUS SPECIES (COAGULASE NEGATIVE)    OXACILLIN                              RESISTANT        >=4    CEFAZOLIN                              RESISTANT  GENTAMICIN                             SENSITIVE      <=0.5    CIPROFLOXACIN                          RESISTANT        >=8    LEVOFLOXACIN                           RESISTANT        >=8    NITROFURANTOIN                         SENSITIVE         32    TRIMETH/SULFA                          SENSITIVE       <=10    VANCOMYCIN                             SENSITIVE      <=0.5    RIFAMPIN                               SENSITIVE      <=0.5    TETRACYCLINE                           SENSITIVE        <=1    END OF REPORT   UA With REFLEX 02Nov2016 07:56AM Leasa Kincannon  SPECIMEN TYPE: CATH URINE   Test Name Result Flag Reference  COLOR YELLOW  YELLOW  ** PLEASE NOTE CHANGE IN UNIT OF MEASURE AND REFERENCE RANGE(S). **  APPEARANCE  CLOUDY A CLEAR  SPECIFIC GRAVITY 1.015  1.001-1.035  pH 7.5  5.0-8.0  GLUCOSE TRACE A NEGATIVE  BILIRUBIN NEGATIVE  NEGATIVE  KETONE NEGATIVE  NEGATIVE  BLOOD 2+ A NEGATIVE  PROTEIN NEGATIVE  NEGATIVE  NITRITE POSITIVE A NEGATIVE  LEUKOCYTE ESTERASE 3+ A NEGATIVE  SQUAMOUS EPITHELIAL/HPF NONE SEEN HPF  <=5  WBC PACKED WBC/HPF A <=5  RBC 10-20 RBC/HPF A <=2  BACTERIA MODERATE HPF A NONE SEEN  CRYSTALS NONE SEEN HPF  NONE SEEN  CASTS NONE SEEN LPF  NONE SEEN  Yeast NONE SEEN HPF  NONE SEEN    PUS: L= 5.16cm; H= 4.30cm; W: 4.65cm Volume: 54.12cc  Procedure  Procedure: Cystoscopy  Chaperone Present: laura.  Indication: Lower Urinary Tract Symptoms.  Informed Consent: Risks, benefits, and potential adverse events were discussed and informed consent was obtained from the patient.  Prep: The patient was prepped with betadine.  Anesthesia:. Local anesthesia was administered intraurethrally with 2% lidocaine jelly.  Antibiotic prophylaxis: Ciprofloxacin.  Procedure Note:  Urethral meatus:. No abnormalities.  Anterior urethra: No abnormalities.  Prostatic urethra:. The lateral and median prostatic lobes were enlarged. An enlarged intravesical median lobe was visualized.  Bladder: Visulization was obscured due to cloudy urine. The ureteral orifices were in the normal anatomic position bilaterally. Examination of the bladder demonstrated trabeculation, but no clot within the bladder and no diverticulum no fistula, no erythematous mucosa, no ulcer, no edema and  no cellules. The patient tolerated the procedure well.  Complications: None.    Assessment Assessed  1. Benign localized hyperplasia of prostate with urinary obstruction and lower urinary tract  symptoms (N40.1,N13.9) 2. Elevated prostate specific antigen (PSA) (R97.20) 3. 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    He is a diabetic, with 2 episodes of acute  urinary retention. He has failed double dose tamsulosin, plus finasteride, and evaluation shows urinary prostatic obstruction, in 56cc gland. he has a peak flow of 12cc/sec, with IPSS= 14/7. He has cystoscopy showing trilobar hypertrophy, with intravesical prostatic growth. His gland does not set up well for minimally invasive technique, and is not large enough to require open surgery. He will need TURP for symptom relief. He will be in the hospital for 2 nights, and will have Foley catheter for probably 2-3 days. He will need Physical Therapy to help him learn to void normally again with good urinary control-with PT evaluation pre-op.    Mr. Reagle does not need this surgery as an emergency; rather he needs this surgery soon, however. We will schedule surgery between Thanksgiving and Christmas.   Plan Benign localized hyperplasia of prostate with urinary obstruction and lower urinary tract symptoms  1. PT/OT Referral Referral  Referral : Pre op TURP.  Status: Hold For -  Appointment,PreCert,Date of Service,Physical Therapy  Requested for: 09Nov2016 2. Cysto; Status:Hold For - Appointment,Date of Service; Requested for:09Nov2016;   TURP in November/Christmas.   Discussion/Summary cc: Elizabeth Palau, NP, [facility].     Signatures Electronically signed by : Jethro Bolus, M.D.; Mar 09 2015  4:38PM EST

## 2015-05-09 NOTE — Anesthesia Procedure Notes (Signed)
Procedure Name: LMA Insertion Date/Time: 05/09/2015 10:59 AM Performed by: Elyn PeersALLEN, Mehgan Santmyer J Pre-anesthesia Checklist: Patient identified, Emergency Drugs available, Suction available, Patient being monitored and Timeout performed Patient Re-evaluated:Patient Re-evaluated prior to inductionOxygen Delivery Method: Circle system utilized Preoxygenation: Pre-oxygenation with 100% oxygen Intubation Type: IV induction Ventilation: Mask ventilation without difficulty LMA: LMA inserted and LMA with gastric port inserted LMA Size: 4.0 Number of attempts: 1 Placement Confirmation: positive ETCO2 and breath sounds checked- equal and bilateral Tube secured with: Tape Dental Injury: Teeth and Oropharynx as per pre-operative assessment

## 2015-05-09 NOTE — Op Note (Signed)
Pre-operative diagnosis :   BPH  Postoperative diagnosis:  Same  Operation:  Cystoscopy, TURP  Surgeon:  S. Patsi Sears, MD  First assistant:  None  Anesthesia:  General LMA  Preparation:  After appropriate pre-anesthesia, the patient was brought to the operative room, placed on the operating table in the dorsal supine position where general LMA anesthesia was introduced. The patient was then replaced in the dorsal lithotomy position with pubis was prepped with Betadine solution and draped in the usual fashion. The history was reviewed. The arm band was double checked. It is noted that this 65 year old insulin-dependent type 2 diabetic is latex allergic.  Review history:  Active Problems Problems  1. Benign localized hyperplasia of prostate with urinary obstruction and lower urinary tract symptoms (N40.1,N13.9) 2. Elevated prostate specific antigen (PSA) (R97.20) 3. 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 He has been treated with finasteride, and double dose tamsulosin. IPSS= 14. He returns today for a cystoscopy, prostate u/s & to review urodynamic results.    He complains of numbness of the bottom of his feet, but no burning or stinging sensation. He has GERD, but no other known digestive problems. The patient has had urinary retention 2 at September 2016, with 1100 cc residual, and 1000 cc residual.   Note the past history of malignant melanoma, as well as hypertension and hepatitis.    Examination showed normal sphincter tone, with 4+ benign prostate. Post void residual in the office was 225 cc. The patient had video urodynamics on 03/04/15 to evaluate bladder outlet obstruction versus hypotonic neurogenic bladder secondary to  diabetes.    The first sensation of filling is at 130 cc and normal desire occurs at 162 cc. There is a strong desire to void at 275 cc. The patient has a bladder contraction, with a catheter induced bladder contraction at 35 cc. He does not have a sensation, however at this time. He has a maximum unstable bladder contraction pressure of only 7 cm of water and no urinary leakage occurs. He is able to inhibit this unstable contraction.    The patient does not leak for Valsalva leak point pressure, even with an abdominal pressure generation of 88 7 m of water pressure.    Pressure flow study shows that the patient is able to void 300 cc, with a maximum flow rate of 12 cc/s, and a detrusor pressure at maximum flow of 43 cm of water pressure, and maximum detrusor pressure of 52 cm of water pressure. PVRs 155 cc.    VCUG shows no bony abnormality, and no reflux. Trabeculation is noted.    The patient has a maximum capacity of approximately 455 cc. Although there is low amplitude instability, it is not significant and no leakage occurs. This catheter induced only. He does have difficulty initiating a stream. This relates to the hesitancy that he notes at home. His stream is weak, but he is able to get a voluntary contraction and get his flow started. He would appear to have an obstructed flow pattern. There was no reflux noted.   Past Medical History Problems  1. History of arthritis (Z87.39) 2. History of diabetes mellitus (Z86.39) 3. History of heartburn (Z87.898) 4. History of hepatitis (Z86.19) 5. History of hypertension (Z86.79) 6. History of malignant melanoma (Z61.096)  Statement of  Likelihood of Success: Excellent. TIME-OUT observed.:  Procedure:  Cystourethroscopy was accomplished, showing large lateral lobes, with elevated bladder neck. The bladder itself showed normal trigone, with clear reflux in both orifices. Trabeculation, grade 2 was present. No evidence of cellule  formation was present. There was no bladder stone tumor or diverticular formation noted.  Resection was begun at the 7:00 position, and carried to the 5:00 position. Resection was then accomplished from the 11:00 to the 7:00 position, and then from the 1:00 to the 5:00 position. Chips were evacuated from the bladder with the piston syringe. Repeat cystoscopy was accomplished, and fulguration of all bleeding points accomplished.  A size 24 latex free catheter was then placed to CBI and traction. The patient received IV Toradol, and was awakened and taken to recovery room in good condition.

## 2015-05-10 ENCOUNTER — Encounter (HOSPITAL_COMMUNITY): Payer: Self-pay | Admitting: Urology

## 2015-05-10 DIAGNOSIS — N401 Enlarged prostate with lower urinary tract symptoms: Secondary | ICD-10-CM | POA: Diagnosis not present

## 2015-05-10 LAB — BASIC METABOLIC PANEL
Anion gap: 9 (ref 5–15)
BUN: 15 mg/dL (ref 6–20)
CHLORIDE: 100 mmol/L — AB (ref 101–111)
CO2: 29 mmol/L (ref 22–32)
Calcium: 8.9 mg/dL (ref 8.9–10.3)
Creatinine, Ser: 0.87 mg/dL (ref 0.61–1.24)
GFR calc Af Amer: >60 mL/min (ref >60)
GFR calc non Af Amer: >60 mL/min (ref >60)
GLUCOSE: 121 mg/dL — AB (ref 65–99)
POTASSIUM: 3.9 mmol/L (ref 3.5–5.1)
SODIUM: 138 mmol/L (ref 135–145)

## 2015-05-10 LAB — GLUCOSE, CAPILLARY
GLUCOSE-CAPILLARY: 129 mg/dL — AB (ref 65–99)
Glucose-Capillary: 179 mg/dL — ABNORMAL HIGH (ref 65–99)

## 2015-05-10 LAB — HEMOGLOBIN AND HEMATOCRIT, BLOOD
HCT: 39.1 % (ref 39.0–52.0)
HEMOGLOBIN: 12.8 g/dL — AB (ref 13.0–17.0)

## 2015-05-10 MED ORDER — HYDROCODONE-IBUPROFEN 5-200 MG PO TABS: 1.0000 | ORAL_TABLET | Freq: Four times a day (QID) | ORAL | Status: DC | PRN

## 2015-05-10 MED ORDER — TRIMETHOPRIM 100 MG PO TABS: 100.0000 mg | ORAL_TABLET | ORAL | Status: DC

## 2015-05-10 MED ORDER — PHENAZOPYRIDINE HCL 200 MG PO TABS: 200.0000 mg | ORAL_TABLET | Freq: Three times a day (TID) | ORAL | Status: DC | PRN

## 2015-05-10 NOTE — Discharge Instructions (Signed)
Benign Prostatic Hyperplasia An enlarged prostate (benign prostatic hyperplasia) is common in older men. You may experience the following:  Weak urine stream.  Dribbling.  Feeling like the bladder has not emptied completely.  Difficulty starting urination.  Getting up frequently at night to urinate.  Urinating more frequently during the day. HOME CARE INSTRUCTIONS  Monitor your prostatic hyperplasia for any changes. The following actions may help to alleviate any discomfort you are experiencing:  Give yourself time when you urinate.  Stay away from alcohol.  Avoid beverages containing caffeine, such as coffee, tea, and colas, because they can make the problem worse.  Avoid decongestants, antihistamines, and some prescription medicines that can make the problem worse.  Follow up with your health care provider for further treatment as recommended. SEEK MEDICAL CARE IF:  You are experiencing progressive difficulty voiding.  Your urine stream is progressively getting narrower.  You are awaking from sleep with the urge to void more frequently.  You are constantly feeling the need to void.  You experience loss of urine, especially in small amounts. SEEK IMMEDIATE MEDICAL CARE IF:   You develop increased pain with urination or are unable to urinate.  You develop severe abdominal pain, vomiting, a high fever, or fainting.  You develop back pain or blood in your urine. MAKE SURE YOU:   Understand these instructions.  Will watch your condition.  Will get help right away if you are not doing well or get worse.   This information is not intended to replace advice given to you by your health care provider. Make sure you discuss any questions you have with your health care provider.   Document Released: 04/16/2005 Document Revised: 05/07/2014 Document Reviewed: 09/16/2012 Elsevier Interactive Patient Education 2016 [employer] SEE: [facility] Diabetes  Control ( Hg A1c 7.2).

## 2015-05-10 NOTE — Progress Notes (Signed)
Urology Progress Note  1 Day Post-Op   Subjective: 65 yo diabetic male, POD 1 post TURP.                          O: Urine pink. No clots. Eating well. Up and around. Min. pain.                          A: Excellent post-op course                        P: D/c today. Leave foley in for 2 more days. RTC as op for foley removal. Will need [facility] to Rx his DM ( note Hg A1c of 7.2)/   No acute urologic events overnight. Ambulation:   positive Flatus:    positive Bowel movement  negative  Pain: some relief  Objective:  Blood pressure 138/67, pulse 112, temperature 97.8 F (36.6 C), temperature source Oral, resp. rate 12, height 5\' 7"  (1.702 m), weight 99.791 kg (220 lb), SpO2 96 %.  Physical Exam:  General:  No acute distress, awake Cardio: regular rate and rhythm, S1, S2 normal, no murmur, click, rub or gallop Extremities: extremities normal, atraumatic, no cyanosis or edema Genitourinary:  Normal suprapubic exam.  Foley: pink. No clots. Crust cleaned at meatus.     I/O last 3 completed shifts: In: 9182.5 [P.O.:490; I.V.:1492.5; Other:7200] Out: [id number] [Urine:11050; Blood:20]  Recent Labs     05/10/15  0526  HGB  12.8*    Recent Labs     05/10/15  0526  NA  138  K  3.9  CL  100*  CO2  29  BUN  15  CREATININE  0.87  CALCIUM  8.9  GFRNONAA  >60  GFRAA  >60    Hg A1c = 7.2  Assessment/Plan:  Catheter not removed. Follow up in 2 days for voiding trial. . Follow-up: [facility] for Diabetes Rx.

## 2015-05-10 NOTE — Progress Notes (Signed)
Patient d/c instructions and prescriptions given,teach back utilized. All questions answered appropriately. Patient had shower. Ambulated. All due meds given per order.

## 2015-05-10 NOTE — Progress Notes (Signed)
Patient d/c home. Stable. 

## 2015-05-10 NOTE — Discharge Summary (Signed)
Physician Discharge Summary  Patient ID: Todd Krause MRN: [id number] DOB/AGE: Nov 26, 1950 65 y.o.  Admit date: 05/09/2015 Discharge date: 05/10/2015  Admission Diagnoses: BPH                                          Diabetes Mellitis Discharge Diagnoses:  Active Problems:   Benign hypertrophy of prostate    Diabetes Mellitis  Discharged Condition: Improved  Hospital Course: TURP 01?12/2015  Significant Diagnostic Studies: Hg A1c 7.2 Discharge Exam: Blood pressure 138/67, pulse 112, temperature 97.8 F (36.6 C), temperature source Oral, resp. rate 12, height 5\' 7"  (1.702 m), weight 99.791 kg (220 lb), SpO2 96 %.   Disposition: 01-Home or Self Care  Discharge Instructions    Care order/instruction    Complete by:  As directed   1. Ok to shower and soap and water to foley catheter and penis. 2. Wash crust from penis and catheter 3. Put neosporin ointment on penis and catheter junction 2x/day     Continue foley catheter    Complete by:  As directed      Discharge patient    Complete by:  As directed      Discontinue IV    Complete by:  As directed             Medication List    STOP taking these medications        aspirin 81 MG tablet     tamsulosin 0.4 MG Caps capsule  Commonly known as:  FLOMAX      TAKE these medications        acetaminophen 500 MG tablet  Commonly known as:  TYLENOL  Take 1,000 mg by mouth every 6 (six) hours as needed for moderate pain.     albuterol (2.5 MG/3ML) 0.083% nebulizer solution  Commonly known as:  PROVENTIL  Take 2.5 mg by nebulization every 6 (six) hours as needed for wheezing or shortness of breath.     albuterol 108 (90 Base) MCG/ACT inhaler  Commonly known as:  PROVENTIL HFA;VENTOLIN HFA  Inhale 2 puffs into the lungs every 4 (four) hours as needed for wheezing or shortness of breath.     fenofibrate 54 MG tablet  TAKE 1 TABLET EVERY DAY     finasteride 5 MG tablet  Commonly known as:  PROSCAR  Take 1  tablet (5 mg total) by mouth daily.     GLUCOPHAGE 1000 MG tablet  Generic drug:  metFORMIN  Take 1,000 mg by mouth 2 (two) times daily.     hydrocodone-ibuprofen 5-200 MG tablet  Commonly known as:  VICOPROFEN  Take 1 tablet by mouth every 6 (six) hours as needed for pain. May increase to 2 tabs q 6 h. Take after food intake to minimize gastritis.     Insulin Glargine 100 UNIT/ML Solostar Pen  Commonly known as:  LANTUS  Inject 80 Units into the skin daily at 10 pm.     Liraglutide 18 MG/3ML Sopn  Inject 1.8 mg into the skin every morning.     lisinopril-hydrochlorothiazide 20-25 MG tablet  Commonly known as:  PRINZIDE,ZESTORETIC  Take 1 tablet by mouth every morning.     metoprolol succinate 25 MG 24 hr tablet  Commonly known as:  TOPROL-XL  Take 1 tablet (25 mg total) by mouth daily.     multivitamin tablet  Take 1 tablet by mouth  every evening.     omeprazole 20 MG capsule  Commonly known as:  PRILOSEC  Take 1 capsule (20 mg total) by mouth daily.     phenazopyridine 200 MG tablet  Commonly known as:  PYRIDIUM  Take 1 tablet (200 mg total) by mouth 3 (three) times daily as needed for pain. Will cause orange discoloration of urine, and will stain clothing.     pravastatin 40 MG tablet  Commonly known as:  PRAVACHOL  Take 40 mg by mouth at bedtime.     Red Yeast Rice 600 MG Caps  Take 1 capsule by mouth daily.     trimethoprim 100 MG tablet  Commonly known as:  TRIMPEX  Take 1 tablet (100 mg total) by mouth 1 day or 1 dose.     vitamin B-12 1000 MCG tablet  Commonly known as:  CYANOCOBALAMIN  Take 1,000 mcg by mouth every evening.     Vitamin D3 2000 units Tabs  Take 1 capsule by mouth every evening.           Follow-up Information    Follow up with Nadyne Gariepy I Patsi SearsANNENBAUM, MD. Call in 2 days.   Specialty:  Urology   Why:  for voiding trial   Contact information:   [address] [place] [address] [phone]     1. RTC 2 days for voiding trial;  and  RTC for usual f/u for review of pathology.  2. Contact [facility] Elizabeth Palau( Teresa Anderson NP) for f/u of DM: Hg A1c 7.2 3. Trimethoprim, Vicodin, Pyridium  Signed: Evalena Fujii I [place] Mcphee 05/10/2015, 8:11 AM  cc: Elizabeth Palaueresa Anderson, NP, [facility]

## 2015-05-11 LAB — HEMOGLOBIN A1C
Hgb A1c MFr Bld: 7.3 % — ABNORMAL HIGH (ref 4.8–5.6)
Mean Plasma Glucose: 163 mg/dL

## 2015-05-11 NOTE — Anesthesia Postprocedure Evaluation (Signed)
Anesthesia Post Note  Patient: Mardi MainlandChristopher M Marion  Procedure(s) Performed: Procedure(s) (LRB): CYSTOSCOPY TRANSURETHRAL RESECTION OF THE PROSTATE (TURP) (N/A)  Patient location during evaluation: PACU Anesthesia Type: General Level of consciousness: awake and alert Pain management: pain level controlled Vital Signs Assessment: post-procedure vital signs reviewed and stable Respiratory status: spontaneous breathing Cardiovascular status: blood pressure returned to baseline Anesthetic complications: no    Last Vitals:  Filed Vitals:   05/10/15 0553 05/10/15 0959  BP: 138/67 151/65  Pulse: 112   Temp: 36.6 C   Resp: 12     Last Pain:  Filed Vitals:   05/10/15 0959  PainSc: 3                  Kennieth RadFitzgerald, Titilayo Hagans E

## 2016-02-10 ENCOUNTER — Inpatient Hospital Stay (HOSPITAL_COMMUNITY)
Admission: EM | Admit: 2016-02-10 | Discharge: 2016-02-14 | DRG: 872 | Disposition: A | Discharge status: Home / Self Care | Payer: Medicare HMO | Attending: Nephrology | Admitting: Nephrology

## 2016-02-10 ENCOUNTER — Emergency Department (HOSPITAL_COMMUNITY): Payer: Medicare HMO

## 2016-02-10 ENCOUNTER — Encounter (HOSPITAL_COMMUNITY): Payer: Self-pay | Admitting: Emergency Medicine

## 2016-02-10 DIAGNOSIS — N451 Epididymitis: Secondary | ICD-10-CM | POA: Diagnosis not present

## 2016-02-10 DIAGNOSIS — R7989 Other specified abnormal findings of blood chemistry: Secondary | ICD-10-CM | POA: Diagnosis present

## 2016-02-10 DIAGNOSIS — Z23 Encounter for immunization: Secondary | ICD-10-CM

## 2016-02-10 DIAGNOSIS — N5082 Scrotal pain: Secondary | ICD-10-CM

## 2016-02-10 DIAGNOSIS — N400 Enlarged prostate without lower urinary tract symptoms: Secondary | ICD-10-CM | POA: Diagnosis present

## 2016-02-10 DIAGNOSIS — Z87891 Personal history of nicotine dependence: Secondary | ICD-10-CM

## 2016-02-10 DIAGNOSIS — Z79899 Other long term (current) drug therapy: Secondary | ICD-10-CM

## 2016-02-10 DIAGNOSIS — Z794 Long term (current) use of insulin: Secondary | ICD-10-CM

## 2016-02-10 DIAGNOSIS — N453 Epididymo-orchitis: Secondary | ICD-10-CM | POA: Diagnosis present

## 2016-02-10 DIAGNOSIS — E119 Type 2 diabetes mellitus without complications: Secondary | ICD-10-CM

## 2016-02-10 DIAGNOSIS — A401 Sepsis due to streptococcus, group B: Principal | ICD-10-CM | POA: Diagnosis present

## 2016-02-10 DIAGNOSIS — Z85820 Personal history of malignant melanoma of skin: Secondary | ICD-10-CM

## 2016-02-10 DIAGNOSIS — N179 Acute kidney failure, unspecified: Secondary | ICD-10-CM

## 2016-02-10 DIAGNOSIS — R6520 Severe sepsis without septic shock: Secondary | ICD-10-CM | POA: Diagnosis present

## 2016-02-10 DIAGNOSIS — Z8249 Family history of ischemic heart disease and other diseases of the circulatory system: Secondary | ICD-10-CM

## 2016-02-10 DIAGNOSIS — E785 Hyperlipidemia, unspecified: Secondary | ICD-10-CM | POA: Diagnosis present

## 2016-02-10 DIAGNOSIS — Z833 Family history of diabetes mellitus: Secondary | ICD-10-CM

## 2016-02-10 DIAGNOSIS — G4733 Obstructive sleep apnea (adult) (pediatric): Secondary | ICD-10-CM | POA: Diagnosis present

## 2016-02-10 DIAGNOSIS — Z9079 Acquired absence of other genital organ(s): Secondary | ICD-10-CM

## 2016-02-10 DIAGNOSIS — I10 Essential (primary) hypertension: Secondary | ICD-10-CM | POA: Diagnosis present

## 2016-02-10 DIAGNOSIS — N529 Male erectile dysfunction, unspecified: Secondary | ICD-10-CM | POA: Diagnosis present

## 2016-02-10 DIAGNOSIS — K219 Gastro-esophageal reflux disease without esophagitis: Secondary | ICD-10-CM | POA: Diagnosis present

## 2016-02-10 DIAGNOSIS — A419 Sepsis, unspecified organism: Secondary | ICD-10-CM | POA: Diagnosis present

## 2016-02-10 LAB — CBC WITH DIFFERENTIAL/PLATELET
BASOS ABS: 0 10*3/uL (ref 0.0–0.1)
BASOS PCT: 0 %
Eosinophils Absolute: 0 10*3/uL (ref 0.0–0.7)
Eosinophils Relative: 0 %
HEMATOCRIT: 42.8 % (ref 39.0–52.0)
Hemoglobin: 14.6 g/dL (ref 13.0–17.0)
Lymphocytes Relative: 10 %
Lymphs Abs: 2.5 10*3/uL (ref 0.7–4.0)
MCH: 29.3 pg (ref 26.0–34.0)
MCHC: 34.1 g/dL (ref 30.0–36.0)
MCV: 85.9 fL (ref 78.0–100.0)
MONO ABS: 1.3 10*3/uL — AB (ref 0.1–1.0)
Monocytes Relative: 6 %
NEUTROS ABS: 20.1 10*3/uL — AB (ref 1.7–7.7)
Neutrophils Relative %: 84 %
PLATELETS: 304 10*3/uL (ref 150–400)
RBC: 4.98 MIL/uL (ref 4.22–5.81)
RDW: 13.8 % (ref 11.5–15.5)
WBC: 24 10*3/uL — AB (ref 4.0–10.5)

## 2016-02-10 LAB — BASIC METABOLIC PANEL
ANION GAP: 12 (ref 5–15)
BUN: 21 mg/dL — ABNORMAL HIGH (ref 6–20)
CALCIUM: 9.3 mg/dL (ref 8.9–10.3)
CO2: 24 mmol/L (ref 22–32)
Chloride: 98 mmol/L — ABNORMAL LOW (ref 101–111)
Creatinine, Ser: 1.35 mg/dL — ABNORMAL HIGH (ref 0.61–1.24)
GFR, EST NON AFRICAN AMERICAN: 53 mL/min — AB (ref >60)
GLUCOSE: 100 mg/dL — AB (ref 65–99)
Potassium: 3.8 mmol/L (ref 3.5–5.1)
SODIUM: 134 mmol/L — AB (ref 135–145)

## 2016-02-10 LAB — I-STAT CG4 LACTIC ACID, ED: LACTIC ACID, VENOUS: 2.1 mmol/L — AB (ref 0.5–1.9)

## 2016-02-10 LAB — CBG MONITORING, ED: GLUCOSE-CAPILLARY: 117 mg/dL — AB (ref 65–99)

## 2016-02-10 MED ORDER — LEVOFLOXACIN IN D5W 750 MG/150ML IV SOLN
750.0000 mg | Freq: Once | INTRAVENOUS | Status: AC
  Administered 2016-02-11: 750 mg via INTRAVENOUS
  Filled 2016-02-10: qty 150

## 2016-02-10 MED ORDER — IBUPROFEN 200 MG PO TABS
400.0000 mg | ORAL_TABLET | Freq: Once | ORAL | Status: AC
  Administered 2016-02-10: 400 mg via ORAL
  Filled 2016-02-10: qty 2

## 2016-02-10 MED ORDER — SODIUM CHLORIDE 0.9 % IV BOLUS (SEPSIS)
500.0000 mL | Freq: Once | INTRAVENOUS | Status: AC
  Administered 2016-02-10: 500 mL via INTRAVENOUS

## 2016-02-10 MED ORDER — SODIUM CHLORIDE 0.9 % IV BOLUS (SEPSIS)
1000.0000 mL | Freq: Once | INTRAVENOUS | Status: AC
  Administered 2016-02-10: 1000 mL via INTRAVENOUS

## 2016-02-10 MED ORDER — OXYCODONE-ACETAMINOPHEN 5-325 MG PO TABS
2.0000 | ORAL_TABLET | Freq: Once | ORAL | Status: AC
  Administered 2016-02-10: 2 via ORAL
  Filled 2016-02-10: qty 2

## 2016-02-10 MED ORDER — HYDROMORPHONE HCL 1 MG/ML IJ SOLN
0.5000 mg | Freq: Once | INTRAMUSCULAR | Status: AC
  Administered 2016-02-10: 0.5 mg via INTRAVENOUS
  Filled 2016-02-10: qty 1

## 2016-02-10 NOTE — ED Provider Notes (Signed)
[facility]-EMERGENCY DEPT Provider Note   CSN: [id number] Arrival date & time: 02/10/16  1641     History   Chief Complaint Chief Complaint  Patient presents with  . Testicle Pain  . Fever    HPI SHANDY VI is a 65 y.o. male.  KRYSTLE OBERMAN is a 65 y.o. male with h/o arthritis, BPH, T1DM, ED, OA presents to ED for scrotal swelling and pain. Onset of left scrotal swelling and pain last night, described as constant, sharp, 10/10 pain. Touching scrotum makes pain worse. Goody powder provided some transient relief. Patient endorses associated fevers at home of approximately 101. Patient seen by PCP today. Noted to have WBC 24K, instructed patient to go to ED for further evaluation. Patient denies abdominal pain, nausea, vomiting, rectal pain, penile discharge, penile pain, back pain, chest pain, SOB, or rash.       Past Medical History:  Diagnosis Date  . Arthritis    hands, knees  . BPH (benign prostatic hypertrophy)   . Chronic bronchitis (HCC)   . ED (erectile dysfunction)   . Elevated PSA   . GERD (gastroesophageal reflux disease)   . History of colon polyps   . Hyperlipidemia   . Hypertension   . Lower urinary tract symptoms (LUTS)   . OSA (obstructive sleep apnea)    mild to moderate osa per study 02-18-2004--  no cpap  . Prostate nodule   . Type 2 diabetes mellitus [facility])     Patient Active Problem List   Diagnosis Date Noted  . Sepsis, unspecified organism (HCC) 02/11/2016  . Elevated serum creatinine 02/11/2016  . Epididymitis 02/11/2016  . Sepsis (HCC) 02/11/2016  . Benign prostatic hyperplasia 05/09/2015  . Type 2 diabetes mellitus without complication, with long-term current use of insulin (HCC) 01/08/2015  . Hyponatremia 01/08/2015  . HYPERCHOLESTEROLEMIA 02/03/2009  . Essential hypertension 08/26/2007  . 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 ERECTILE DYSFUNCTION 01/24/2007  . OSTEOARTHRITIS 01/24/2007    Past Surgical History:  Procedure Laterality  Date  . 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 COLONOSCOPY  last one -- april 2016  . EYE SURGERY Bilateral    iol lens for cataracts  . melanoma removed from back  15 yrs ago  . PROSTATE BIOPSY N/A 10/01/2014   Procedure: BIOPSY TRANSRECTAL ULTRASONIC PROSTATE (TUBP);  Surgeon: Jethro Bolus, MD;  [facility]: [facility];  Service: Urology;  Laterality: N/A;  . TRANSTHORACIC ECHOCARDIOGRAM  11-20-2009   grade I diastolic dysfunction/  ef 60-65%/  mild AR, MR, and TR/  trivial PR  . TRANSURETHRAL RESECTION OF PROSTATE N/A 05/09/2015   Procedure: CYSTOSCOPY TRANSURETHRAL RESECTION OF THE PROSTATE (TURP);  Surgeon: Jethro Bolus, MD;  Location: [facility];  Service: Urology;  Laterality: N/A;       Home Medications    Prior to Admission medications   Medication Sig Start Date End Date Taking? Authorizing Provider  acetaminophen (TYLENOL) 500 MG tablet Take 1,000 mg by mouth every 6 (six) hours as needed for moderate pain.    Yes Historical Provider, MD  albuterol (PROVENTIL HFA;VENTOLIN HFA) 108 (90 BASE) MCG/ACT inhaler Inhale 2 puffs into the lungs every 4 (four) hours as needed for wheezing or shortness of breath.  03/01/14  Yes Historical Provider, MD  albuterol (PROVENTIL) (2.5 MG/3ML) 0.083% nebulizer solution Take 2.5 mg by nebulization every 6 (  six) hours as needed for wheezing or shortness of breath.    Yes Historical Provider, MD  Aspirin-Acetaminophen-Caffeine (GOODY HEADACHE PO) Take 1 each by mouth daily as needed (pain).   Yes Historical Provider, MD  Cholecalciferol (VITAMIN D3) 2000 UNITS TABS Take 1 capsule by mouth every evening.    Yes Historical Provider, MD  fenofibrate 54 MG tablet TAKE 1 TABLET EVERY DAY Patient taking differently: Take 54 mg by mouth every evening 07/10/13  Yes Romero Belling, MD  finasteride (PROSCAR) 5 MG tablet Take  1 tablet (5 mg total) by mouth daily. Patient taking differently: Take 5 mg by mouth every evening.  01/11/15  Yes Costin Otelia Sergeant, MD  Insulin Glargine (LANTUS) 100 UNIT/ML Solostar Pen Inject 120 Units into the skin daily at 10 pm.  04/19/14  Yes Historical Provider, MD  Liraglutide 18 MG/3ML SOPN Inject 1.8 mg into the skin every morning.   Yes Historical Provider, MD  lisinopril-hydrochlorothiazide (PRINZIDE,ZESTORETIC) 20-25 MG per tablet Take 1 tablet by mouth every morning.  03/01/14  Yes Historical Provider, MD  metFORMIN (GLUCOPHAGE) 1000 MG tablet Take 1,000 mg by mouth 2 (two) times daily. 08/03/14  Yes Historical Provider, MD  metoprolol succinate (TOPROL-XL) 25 MG 24 hr tablet Take 1 tablet (25 mg total) by mouth daily. Patient taking differently: Take 25 mg by mouth every evening.  05/13/13  Yes Romero Belling, MD  Multiple Vitamin (MULTIVITAMIN) tablet Take 1 tablet by mouth every evening.    Yes Historical Provider, MD  naproxen sodium (ANAPROX) 220 MG tablet Take 440 mg by mouth every 12 (twelve) hours as needed (pain).   Yes Historical Provider, MD  omeprazole (PRILOSEC) 20 MG capsule Take 1 capsule (20 mg total) by mouth daily. Patient taking differently: Take 20 mg by mouth 2 (two) times daily.  05/13/13  Yes Romero Belling, MD  pravastatin (PRAVACHOL) 40 MG tablet Take 40 mg by mouth at bedtime.     Yes Historical Provider, MD  Red Yeast Rice 600 MG CAPS Take 600 mg by mouth every evening.    Yes Historical Provider, MD  vitamin B-12 (CYANOCOBALAMIN) 1000 MCG tablet Take 1,000 mcg by mouth every evening.    Yes Historical Provider, MD    Family History Family History  Problem Relation Age of Onset  . Diabetes Mother   . Cancer Mother   . Diabetes Father   . Heart attack Father   . Colon cancer Neg Hx   . Colon polyps Neg Hx   . Kidney disease Neg Hx   . Gallbladder disease Neg Hx   . Esophageal cancer Neg Hx     Social History Social History  Substance Use Topics  .  Smoking status: Former Smoker    Packs/day: 2.00    Years: 30.00    Types: Cigarettes    Quit date: 09/23/2006  . Smokeless tobacco: Never Used  . Alcohol use No     Allergies   Latex and Codeine   Review of Systems Review of Systems  Constitutional: Positive for fever. Negative for chills and diaphoresis.  HENT: Negative for trouble swallowing.   Eyes: Negative for visual disturbance.  Respiratory: Negative for shortness of breath.   Cardiovascular: Negative for chest pain.  Gastrointestinal: Positive for diarrhea. Negative for abdominal pain, blood in stool, constipation, nausea, rectal pain and vomiting.  Genitourinary: Positive for scrotal swelling and testicular pain. Negative for discharge, dysuria, hematuria, penile pain and penile swelling.  Musculoskeletal: Negative for back pain.  Skin: Negative  for rash.  Neurological: Negative for syncope and headaches.     Physical Exam Updated Vital Signs BP 130/57 (BP Location: Left Arm)   Pulse 94   Temp 100 F (37.8 C) (Oral)   Resp 20   Wt 99.8 kg   BMI 34.46 kg/m   Physical Exam  Constitutional: He appears well-developed and well-nourished. No distress.  HENT:  Head: Normocephalic and atraumatic.  Mouth/Throat: Oropharynx is clear and moist. No oropharyngeal exudate.  Eyes: Conjunctivae and EOM are normal. Pupils are equal, round, and reactive to light. Right eye exhibits no discharge. Left eye exhibits no discharge. No scleral icterus.  Neck: Normal range of motion and phonation normal. Neck supple. No neck rigidity. Normal range of motion present.  Cardiovascular: Normal rate, regular rhythm, normal heart sounds and intact distal pulses.   No murmur heard. Pulmonary/Chest: Effort normal and breath sounds normal. No stridor. No respiratory distress. He has no wheezes. He has no rales.  Abdominal: Soft. Bowel sounds are normal. He exhibits no distension. There is no tenderness. There is no rigidity, no rebound, no  guarding and no CVA tenderness.  Genitourinary: Uncircumcised.  Genitourinary Comments: Chaperone present for duration of exam. Uncircumcised penis noted. Significant swelling, erythema, and tenderness of left testicle and scrotum appreciated. No penile pain. No penile discharge. Foreskin easily retracts.   Musculoskeletal: Normal range of motion.  Lymphadenopathy:    He has no cervical adenopathy.  Neurological: He is alert. He is not disoriented. Coordination and gait normal. GCS eye subscore is 4. GCS verbal subscore is 5. GCS motor subscore is 6.  Skin: Skin is warm and dry. He is not diaphoretic.  Psychiatric: He has a normal mood and affect. His behavior is normal.     ED Treatments / Results  Labs (all labs ordered are listed, but only abnormal results are displayed) Labs Reviewed  BASIC METABOLIC PANEL - Abnormal; Notable for the following:       Result Value   Sodium 134 (*)    Chloride 98 (*)    Glucose, Bld 100 (*)    BUN 21 (*)    Creatinine, Ser 1.35 (*)    GFR calc non Af Amer 53 (*)    All other components within normal limits  CBC WITH DIFFERENTIAL/PLATELET - Abnormal; Notable for the following:    WBC 24.0 (*)    Neutro Abs 20.1 (*)    Monocytes Absolute 1.3 (*)    All other components within normal limits  URINALYSIS, ROUTINE W REFLEX MICROSCOPIC (NOT AT [facility]) - Abnormal; Notable for the following:    Color, Urine AMBER (*)    APPearance CLOUDY (*)    Bilirubin Urine SMALL (*)    Leukocytes, UA MODERATE (*)    All other components within normal limits  URINE MICROSCOPIC-ADD ON - Abnormal; Notable for the following:    Squamous Epithelial / LPF 0-5 (*)    Bacteria, UA RARE (*)    All other components within normal limits  CBG MONITORING, ED - Abnormal; Notable for the following:    Glucose-Capillary 117 (*)    All other components within normal limits  I-STAT CG4 LACTIC ACID, ED - Abnormal; Notable for the following:    Lactic Acid, Venous 2.10 (*)    All  other components within normal limits  CULTURE, BLOOD (ROUTINE X 2)  CULTURE, BLOOD (ROUTINE X 2)  URINE CULTURE    EKG  EKG Interpretation None       Radiology Koreas Scrotum  Result Date: 02/10/2016 CLINICAL DATA:  Left-sided scrotal pain EXAM: ULTRASOUND OF SCROTUM TECHNIQUE: Complete ultrasound examination of the testicles, epididymis, and other scrotal structures was performed. COMPARISON:  None. FINDINGS: Right testicle Measurements: 5.1 x 1.9 x 2.8 cm. No mass or microlithiasis visualized. Left testicle Measurements: 4.8 x 2.9 x 3.5 cm. No mass or microlithiasis visualized. Right epididymis: Right epididymal cyst measuring 2.1 x 1.2 x 1.8 cm. Left epididymis:  Slightly enlarged and hypervascular. Hydrocele: Small right simple appearing hydrocele. Small left-sided hydrocele containing septation but no solid tissue. Varicocele:  Minimal right varicocele IMPRESSION: 1. No sonographic evidence for testicular torsion. Slightly enlarged and hyperemic left epididymis could relate to epididymitis. 2. Right epididymal cyst. 3. Trace right hydrocele.  Small septated left hydrocele. 4. Minimal right varicocele. Electronically Signed   By: Jasmine Pang M.D.   On: 02/10/2016 18:31   [place] Art/ven Flow Abd Pelv Doppler  Result Date: 02/10/2016 CLINICAL DATA:  Left-sided scrotal pain EXAM: ULTRASOUND OF SCROTUM TECHNIQUE: Complete ultrasound examination of the testicles, epididymis, and other scrotal structures was performed. COMPARISON:  None. FINDINGS: Right testicle Measurements: 5.1 x 1.9 x 2.8 cm. No mass or microlithiasis visualized. Left testicle Measurements: 4.8 x 2.9 x 3.5 cm. No mass or microlithiasis visualized. Right epididymis: Right epididymal cyst measuring 2.1 x 1.2 x 1.8 cm. Left epididymis:  Slightly enlarged and hypervascular. Hydrocele: Small right simple appearing hydrocele. Small left-sided hydrocele containing septation but no solid tissue. Varicocele:  Minimal right varicocele  IMPRESSION: 1. No sonographic evidence for testicular torsion. Slightly enlarged and hyperemic left epididymis could relate to epididymitis. 2. Right epididymal cyst. 3. Trace right hydrocele.  Small septated left hydrocele. 4. Minimal right varicocele. Electronically Signed   By: Jasmine Pang M.D.   On: 02/10/2016 18:31    Procedures Procedures (including critical care time)  Medications Ordered in ED Medications  levofloxacin (LEVAQUIN) IVPB 750 mg (750 mg Intravenous New Bag/Given 02/11/16 0033)  oxyCODONE-acetaminophen (PERCOCET/ROXICET) 5-325 MG per tablet 2 tablet (2 tablets Oral Given 02/10/16 1721)  sodium chloride 0.9 % bolus 500 mL (0 mLs Intravenous Stopped 02/10/16 2353)  ibuprofen (ADVIL,MOTRIN) tablet 400 mg (400 mg Oral Given 02/10/16 2220)  HYDROmorphone (DILAUDID) injection 0.5 mg (0.5 mg Intravenous Given 02/10/16 2220)  sodium chloride 0.9 % bolus 1,000 mL (1,000 mLs Intravenous New Bag/Given 02/10/16 2353)     Initial Impression / Assessment and Plan / ED Course  I have reviewed the triage vital signs and the nursing notes.  Pertinent labs & imaging results that were available during my care of the patient were reviewed by me and considered in my medical decision making (see chart for details).  Clinical Course    Patient presents to ED with complaint of left scrotal swelling and pain. Patient is has low grade fever of 100. Patient is non-toxic appearing in NAD. Not hypotensive, tachycardic, tachypneic, or hypoxic. Physical exam remarkable for significant left scrotal swelling, erythema, and tenderness to palpation. Soft, non-tender abdomen. Heart RRR. Lungs CTABL. [place] of scrotum negative for testicular torsion, enlarged and hyperemic left epididymis concerning for epididymitis. IVF and pain medication initiated. Will recheck CBC and BMP given h/o DM.   Lactic acid elevated at 2.1. CBC remarkable for leukocytosis at 24K. BMP shows AKI with creatinine at 1.35. Glucose  100, no AG. Mild hypokalemia/hypochloremia, with AKI -?dehydration. Urine remarkable for leukocytes. Blood cultures and urine cultures pending. IV ABX initiated.    Given physical exam, leukocytosis, elevated lactic acid will consult hospitalist for admission for  further management of epididymitis.   Spoke with Dr. Maryfrances Bunnell of [facility], greatly appreciate your time and input. Agree to admit patient for further management of epididymitis. Thank you for your continued care of this patient.   Final Clinical Impressions(s) / ED Diagnoses   Final diagnoses:  Epididymitis  Acute kidney injury [facility])    New Prescriptions New Prescriptions   No medications on file     Lona Kettle, PA-C 02/11/16 0133    Arby Barrette, MD 02/11/16 1348

## 2016-02-10 NOTE — ED Triage Notes (Signed)
Per pt, states left testicular pain and fever-states symptoms started this am-saw PCP-sent here for further eval

## 2016-02-11 ENCOUNTER — Encounter (HOSPITAL_COMMUNITY): Payer: Self-pay | Admitting: Family Medicine

## 2016-02-11 DIAGNOSIS — A419 Sepsis, unspecified organism: Secondary | ICD-10-CM

## 2016-02-11 DIAGNOSIS — Z9079 Acquired absence of other genital organ(s): Secondary | ICD-10-CM | POA: Diagnosis not present

## 2016-02-11 DIAGNOSIS — E785 Hyperlipidemia, unspecified: Secondary | ICD-10-CM | POA: Diagnosis present

## 2016-02-11 DIAGNOSIS — Z85820 Personal history of malignant melanoma of skin: Secondary | ICD-10-CM | POA: Diagnosis not present

## 2016-02-11 DIAGNOSIS — Z79899 Other long term (current) drug therapy: Secondary | ICD-10-CM | POA: Diagnosis not present

## 2016-02-11 DIAGNOSIS — N453 Epididymo-orchitis: Secondary | ICD-10-CM | POA: Diagnosis present

## 2016-02-11 DIAGNOSIS — R6520 Severe sepsis without septic shock: Secondary | ICD-10-CM | POA: Diagnosis present

## 2016-02-11 DIAGNOSIS — N451 Epididymitis: Secondary | ICD-10-CM | POA: Diagnosis present

## 2016-02-11 DIAGNOSIS — N529 Male erectile dysfunction, unspecified: Secondary | ICD-10-CM | POA: Diagnosis present

## 2016-02-11 DIAGNOSIS — R7989 Other specified abnormal findings of blood chemistry: Secondary | ICD-10-CM | POA: Diagnosis not present

## 2016-02-11 DIAGNOSIS — G4733 Obstructive sleep apnea (adult) (pediatric): Secondary | ICD-10-CM | POA: Diagnosis present

## 2016-02-11 DIAGNOSIS — Z794 Long term (current) use of insulin: Secondary | ICD-10-CM | POA: Diagnosis not present

## 2016-02-11 DIAGNOSIS — Z87891 Personal history of nicotine dependence: Secondary | ICD-10-CM | POA: Diagnosis not present

## 2016-02-11 DIAGNOSIS — N179 Acute kidney failure, unspecified: Secondary | ICD-10-CM | POA: Diagnosis not present

## 2016-02-11 DIAGNOSIS — N401 Enlarged prostate with lower urinary tract symptoms: Secondary | ICD-10-CM | POA: Diagnosis not present

## 2016-02-11 DIAGNOSIS — Z8249 Family history of ischemic heart disease and other diseases of the circulatory system: Secondary | ICD-10-CM | POA: Diagnosis not present

## 2016-02-11 DIAGNOSIS — I10 Essential (primary) hypertension: Secondary | ICD-10-CM | POA: Diagnosis not present

## 2016-02-11 DIAGNOSIS — A401 Sepsis due to streptococcus, group B: Secondary | ICD-10-CM | POA: Diagnosis present

## 2016-02-11 DIAGNOSIS — Z23 Encounter for immunization: Secondary | ICD-10-CM | POA: Diagnosis not present

## 2016-02-11 DIAGNOSIS — Z833 Family history of diabetes mellitus: Secondary | ICD-10-CM | POA: Diagnosis not present

## 2016-02-11 DIAGNOSIS — E119 Type 2 diabetes mellitus without complications: Secondary | ICD-10-CM | POA: Diagnosis not present

## 2016-02-11 DIAGNOSIS — K219 Gastro-esophageal reflux disease without esophagitis: Secondary | ICD-10-CM | POA: Diagnosis present

## 2016-02-11 LAB — CBC
HCT: 36.2 % — ABNORMAL LOW (ref 39.0–52.0)
HEMOGLOBIN: 12.1 g/dL — AB (ref 13.0–17.0)
MCH: 28.8 pg (ref 26.0–34.0)
MCHC: 33.4 g/dL (ref 30.0–36.0)
MCV: 86.2 fL (ref 78.0–100.0)
Platelets: 250 10*3/uL (ref 150–400)
RBC: 4.2 MIL/uL — AB (ref 4.22–5.81)
RDW: 13.8 % (ref 11.5–15.5)
WBC: 22.3 10*3/uL — ABNORMAL HIGH (ref 4.0–10.5)

## 2016-02-11 LAB — BASIC METABOLIC PANEL
Anion gap: 7 (ref 5–15)
BUN: 25 mg/dL — AB (ref 6–20)
CHLORIDE: 101 mmol/L (ref 101–111)
CO2: 26 mmol/L (ref 22–32)
Calcium: 8.3 mg/dL — ABNORMAL LOW (ref 8.9–10.3)
Creatinine, Ser: 1.19 mg/dL (ref 0.61–1.24)
GFR calc Af Amer: >60 mL/min (ref >60)
GFR calc non Af Amer: >60 mL/min (ref >60)
GLUCOSE: 192 mg/dL — AB (ref 65–99)
POTASSIUM: 3.7 mmol/L (ref 3.5–5.1)
Sodium: 134 mmol/L — ABNORMAL LOW (ref 135–145)

## 2016-02-11 LAB — URINALYSIS, ROUTINE W REFLEX MICROSCOPIC
GLUCOSE, UA: NEGATIVE mg/dL
HGB URINE DIPSTICK: NEGATIVE
Ketones, ur: NEGATIVE mg/dL
Nitrite: NEGATIVE
PH: 5.5 (ref 5.0–8.0)
Protein, ur: NEGATIVE mg/dL
SPECIFIC GRAVITY, URINE: 1.027 (ref 1.005–1.030)

## 2016-02-11 LAB — GLUCOSE, CAPILLARY
GLUCOSE-CAPILLARY: 198 mg/dL — AB (ref 65–99)
GLUCOSE-CAPILLARY: 185 mg/dL — AB (ref 65–99)
GLUCOSE-CAPILLARY: 140 mg/dL — AB (ref 65–99)
Glucose-Capillary: 228 mg/dL — ABNORMAL HIGH (ref 65–99)
Glucose-Capillary: 222 mg/dL — ABNORMAL HIGH (ref 65–99)

## 2016-02-11 LAB — LACTIC ACID, PLASMA: Lactic Acid, Venous: 1.3 mmol/L (ref 0.5–1.9)

## 2016-02-11 LAB — URINE MICROSCOPIC-ADD ON

## 2016-02-11 LAB — CREATININE, URINE, RANDOM: Creatinine, Urine: 327.75 mg/dL

## 2016-02-11 LAB — SODIUM, URINE, RANDOM: Sodium, Ur: 38 mmol/L

## 2016-02-11 MED ORDER — PRAVASTATIN SODIUM 40 MG PO TABS
40.0000 mg | ORAL_TABLET | Freq: Every day | ORAL | Status: DC
  Administered 2016-02-11 – 2016-02-13 (×4): 40 mg via ORAL
  Filled 2016-02-11 (×4): qty 1

## 2016-02-11 MED ORDER — OXYCODONE HCL 5 MG PO TABS
5.0000 mg | ORAL_TABLET | ORAL | Status: DC | PRN
  Administered 2016-02-11 (×4): 5 mg via ORAL
  Filled 2016-02-11 (×5): qty 1

## 2016-02-11 MED ORDER — PANTOPRAZOLE SODIUM 40 MG PO TBEC
40.0000 mg | DELAYED_RELEASE_TABLET | Freq: Every day | ORAL | Status: DC
  Administered 2016-02-11 – 2016-02-14 (×4): 40 mg via ORAL
  Filled 2016-02-11 (×4): qty 1

## 2016-02-11 MED ORDER — ONDANSETRON HCL 4 MG/2ML IJ SOLN
4.0000 mg | Freq: Four times a day (QID) | INTRAMUSCULAR | Status: DC | PRN
Start: 2016-02-11 — End: 2016-02-14

## 2016-02-11 MED ORDER — INSULIN ASPART 100 UNIT/ML ~~LOC~~ SOLN
0.0000 [IU] | Freq: Every day | SUBCUTANEOUS | Status: DC
  Administered 2016-02-11 – 2016-02-12 (×2): 2 [IU] via SUBCUTANEOUS

## 2016-02-11 MED ORDER — LEVOFLOXACIN IN D5W 500 MG/100ML IV SOLN
500.0000 mg | INTRAVENOUS | Status: DC
  Administered 2016-02-11 – 2016-02-12 (×2): 500 mg via INTRAVENOUS
  Filled 2016-02-11 (×2): qty 100

## 2016-02-11 MED ORDER — IBUPROFEN 200 MG PO TABS
600.0000 mg | ORAL_TABLET | Freq: Four times a day (QID) | ORAL | Status: DC | PRN
  Administered 2016-02-12: 600 mg via ORAL
  Filled 2016-02-11: qty 3

## 2016-02-11 MED ORDER — ENOXAPARIN SODIUM 40 MG/0.4ML ~~LOC~~ SOLN
40.0000 mg | SUBCUTANEOUS | Status: DC
  Administered 2016-02-11 – 2016-02-14 (×4): 40 mg via SUBCUTANEOUS
  Filled 2016-02-11 (×4): qty 0.4

## 2016-02-11 MED ORDER — VANCOMYCIN HCL IN DEXTROSE 1-5 GM/200ML-% IV SOLN
1000.0000 mg | Freq: Two times a day (BID) | INTRAVENOUS | Status: DC
  Administered 2016-02-12 – 2016-02-14 (×5): 1000 mg via INTRAVENOUS
  Filled 2016-02-11 (×5): qty 200

## 2016-02-11 MED ORDER — INSULIN ASPART 100 UNIT/ML ~~LOC~~ SOLN
0.0000 [IU] | Freq: Three times a day (TID) | SUBCUTANEOUS | Status: DC
  Administered 2016-02-11: 4 [IU] via SUBCUTANEOUS
  Administered 2016-02-11: 7 [IU] via SUBCUTANEOUS
  Administered 2016-02-11: 3 [IU] via SUBCUTANEOUS
  Administered 2016-02-12: 4 [IU] via SUBCUTANEOUS
  Administered 2016-02-12: 3 [IU] via SUBCUTANEOUS
  Administered 2016-02-13 – 2016-02-14 (×3): 4 [IU] via SUBCUTANEOUS

## 2016-02-11 MED ORDER — SODIUM CHLORIDE 0.9 % IV SOLN
INTRAVENOUS | Status: AC
  Administered 2016-02-11: 07:00:00 via INTRAVENOUS

## 2016-02-11 MED ORDER — SENNOSIDES-DOCUSATE SODIUM 8.6-50 MG PO TABS: 1.0000 | ORAL_TABLET | Freq: Every evening | ORAL | Status: DC | PRN

## 2016-02-11 MED ORDER — METOPROLOL SUCCINATE ER 25 MG PO TB24
25.0000 mg | ORAL_TABLET | Freq: Every evening | ORAL | Status: DC
  Administered 2016-02-11 – 2016-02-13 (×3): 25 mg via ORAL
  Filled 2016-02-11 (×4): qty 1

## 2016-02-11 MED ORDER — OXYCODONE-ACETAMINOPHEN 5-325 MG PO TABS
1.0000 | ORAL_TABLET | ORAL | Status: DC | PRN
  Administered 2016-02-11 – 2016-02-14 (×6): 1 via ORAL
  Filled 2016-02-11 (×6): qty 1

## 2016-02-11 MED ORDER — FINASTERIDE 5 MG PO TABS
5.0000 mg | ORAL_TABLET | Freq: Every day | ORAL | Status: DC
  Administered 2016-02-11 – 2016-02-13 (×4): 5 mg via ORAL
  Filled 2016-02-11 (×4): qty 1

## 2016-02-11 MED ORDER — INSULIN GLARGINE 100 UNIT/ML ~~LOC~~ SOLN
120.0000 [IU] | Freq: Every day | SUBCUTANEOUS | Status: DC
Start: 2016-02-11 — End: 2016-02-12
  Administered 2016-02-11 (×2): 120 [IU] via SUBCUTANEOUS
  Filled 2016-02-11 (×2): qty 1.2

## 2016-02-11 MED ORDER — BISACODYL 10 MG RE SUPP: 10.0000 mg | Freq: Every day | RECTAL | Status: DC | PRN

## 2016-02-11 MED ORDER — ACETAMINOPHEN 500 MG PO TABS
1000.0000 mg | ORAL_TABLET | Freq: Four times a day (QID) | ORAL | Status: DC | PRN
  Administered 2016-02-11 – 2016-02-14 (×5): 1000 mg via ORAL
  Filled 2016-02-11 (×5): qty 2

## 2016-02-11 MED ORDER — ONDANSETRON HCL 4 MG PO TABS: 4.0000 mg | ORAL_TABLET | Freq: Four times a day (QID) | ORAL | Status: DC | PRN

## 2016-02-11 MED ORDER — VANCOMYCIN HCL 10 G IV SOLR
1500.0000 mg | Freq: Once | INTRAVENOUS | Status: AC
  Administered 2016-02-11: 1500 mg via INTRAVENOUS
  Filled 2016-02-11: qty 1500

## 2016-02-11 NOTE — Progress Notes (Signed)
Pharmacy Antibiotic Note  Todd Krause is a [id number] y.o. male admitted on 02/10/2016 with Epididymitis.  Pharmacy previously consulted for Levofloxacin dosing, now consulted to add Vancomycin for empiric MRSA coverage.  Plan:  Vancomycin 1500mg  IV x 1, then 1g IV q12h  Check trough at steady state; goal 10-15 mcg/ml for cellulitis  Follow up renal function & cultures, de-escalate as appropriate  Height: 5\' 8"  (172.7 cm) Weight: 220 lb (99.8 kg) IBW/kg (Calculated) : 68.4  Temp (24hrs), Avg:98.7 F (37.1 C), Min:98 F (36.7 C), Max:100 F (37.8 C)   Recent Labs Lab 02/10/16 2223 02/10/16 2235 02/11/16 0221  WBC 24.0*  --  22.3*  CREATININE 1.35*  --  1.19  LATICACIDVEN  --  2.10* 1.3    Estimated Creatinine Clearance: 69 mL/min (by C-G formula based on SCr of 1.19 mg/dL).    Allergies  Allergen Reactions  . Latex Hives  . Codeine Other (See Comments)    constipation    Antimicrobials this admission: Levofloxacin 10/14 >> Vancomycin 10/14 >>  Dose adjustments this admission: -  Microbiology results: 10/13 BCx: sent 10/13 UCx: sent  Thank you for allowing pharmacy to be a part of this patient's care.  Loralee PacasErin Royalty Fakhouri, PharmD, BCPS Pager: [phone] 02/11/2016 3:02 PM

## 2016-02-11 NOTE — Progress Notes (Addendum)
Pt stated that his scrotal swelling occurred 24 hours after a child had ran into him at church.   Pt is on bed alarm due to his unsteady gait since he walking with a wide stance to avoid friction with the area.

## 2016-02-11 NOTE — Progress Notes (Signed)
Pt scrotal edema has increased since earlier AM assessment. Elevation and ice applied. No issues with voiding. Will continue to monitor. Justin Mendaudle, Lizania Bouchard H, RN

## 2016-02-11 NOTE — Progress Notes (Signed)
Pharmacy Antibiotic Note  Mardi MainlandChristopher M Phetteplace is a 65 y.o. male admitted on 02/10/2016 with Epididymitis.  Pharmacy has been consulted for Levofloxacin dosing.  Plan: Levofloxacin 500mg  iv q24hr   Height: 5\' 8"  (172.7 cm) Weight: 220 lb (99.8 kg) IBW/kg (Calculated) : 68.4  Temp (24hrs), Avg:100 F (37.8 C), Min:100 F (37.8 C), Max:100 F (37.8 C)   Recent Labs Lab 02/10/16 2223 02/10/16 2235  WBC 24.0*  --   CREATININE 1.35*  --   LATICACIDVEN  --  2.10*    Estimated Creatinine Clearance: 60.8 mL/min (by C-G formula based on SCr of 1.35 mg/dL (H)).    Allergies  Allergen Reactions  . Latex Hives  . Codeine Other (See Comments)    constipation    Antimicrobials this admission: Levofloxacin 10/14 >>  Dose adjustments this admission: -  Microbiology results: pending  Thank you for allowing pharmacy to be a part of this patient's care.  Aleene DavidsonGrimsley Jr, Trejon Duford Crowford 02/11/2016 1:55 AM

## 2016-02-11 NOTE — Consult Note (Signed)
New Urology Consult Note   Requesting Attending Physician:  Maxie Barb, MD Service Providing Consult: Urology Consulting Attending: Mena Goes, MD  Assessment:  Patient is a 65 y.o. male with history of BPH s/p TURP admitted with lactate 2.1, WBC 24, left scrotal pain and edema with ultrasound suggestive of left sided epididymitis, with left scrotal cellulitis apparent on physical exam as well. There is scrotal edema noted on ultrasound as well.  Recommendations: 1. Add vancomycin for cellulitis (and MRSA) coverage 2. Ice PRN to scrotum 3. Elevate scrotum using towel while lying flat and use mesh underwear/jock strap for scrotal support 4. NSAIDs for pain control 5. PVR x1 to ensure complete bladder emptying 6. Follow up urine & blood cultures  Thank you for this consult. Please contact the urology consult pager with any further questions/concerns. [name], MD Urology Surgical Resident   HPI -   Reason for Consult:  Left epididymitis  Todd Krause is seen in consultation for reasons noted above at the request of Dron Jaynie Collins, MD.  This is a 65 yo patient with a history of osteoarthritis, erectile dysfunction, elevated PSA, GERD, HLD, HTN, OSA, type II DM. Has a history of prostate biopsy with benign findings. S/p TURP last year with Dr. Patsi Sears. Admitted to medicine after presenting with fever, left sided scrotal swelling and pain. Scrotal u/s concerning for left epididymitis. Currently on levaquin.  Tells me that he was voiding very well and at baseline prior to admission - no gross hematuria, dysuria, urinary straining. Nocturia typically 0-2x a night, no changes recently. No increase in urinary urgency or frequency recently. Feels like he is emptying his bladder. Started noticing left sided scrotal pain and swelling 2 days ago. Fever of 101 at home prior to coming to hospital. No n/v, diarrhea/constipation.   Afebrile. Normotensive. Voiding  spontaneously. Cr 1.19 (baseline 0.8). WBC 22. Urine culture & blood cultures pending. Lactic acid 2.1. Urinalysis shows rare bacteria, mod leuk, 0-5 squam, 6-30 WBC.   Denies history of orchitis/prostatitis.   Past Medical History: Past Medical History:  Diagnosis Date  . Arthritis    hands, knees  . BPH (benign prostatic hypertrophy)   . Chronic bronchitis (HCC)   . ED (erectile dysfunction)   . Elevated PSA   . GERD (gastroesophageal reflux disease)   . History of colon polyps   . Hyperlipidemia   . Hypertension   . Lower urinary tract symptoms (LUTS)   . OSA (obstructive sleep apnea)    mild to moderate osa per study 02-18-2004--  no cpap  . Prostate nodule   . Type 2 diabetes mellitus (HCC)     Past Surgical History:  Past Surgical History:  Procedure Laterality Date  . 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 COLONOSCOPY  last one -- april 2016  . EYE SURGERY Bilateral    iol lens for cataracts  . melanoma removed from back  15 yrs ago  . PROSTATE BIOPSY N/A 10/01/2014   Procedure: BIOPSY TRANSRECTAL ULTRASONIC PROSTATE (TUBP);  Surgeon: Jethro Bolus, MD;  Location: [facility];  Service: Urology;  Laterality: N/A;  . TRANSTHORACIC ECHOCARDIOGRAM  11-20-2009   grade I diastolic dysfunction/  ef 60-65%/  mild AR, MR, and TR/  trivial PR  . TRANSURETHRAL RESECTION OF PROSTATE N/A 05/09/2015   Procedure: CYSTOSCOPY TRANSURETHRAL RESECTION OF  THE PROSTATE (TURP);  Surgeon: Jethro BolusSigmund Tannenbaum, MD;  Location: [facility];  Service: Urology;  Laterality: N/A;    Medication: Current Facility-Administered Medications  Medication Dose Route Frequency Provider Last Rate Last Dose  . acetaminophen (TYLENOL) tablet 1,000 mg  1,000 mg Oral Q6H PRN Alberteen Samhristopher P Danford, MD   1,000 mg at 02/11/16 1211  . bisacodyl (DULCOLAX) suppository 10 mg  10 mg  Rectal Daily PRN Alberteen Samhristopher P Danford, MD      . enoxaparin (LOVENOX) injection 40 mg  40 mg Subcutaneous Q24H Alberteen Samhristopher P Danford, MD   40 mg at 02/11/16 0946  . finasteride (PROSCAR) tablet 5 mg  5 mg Oral QHS Alberteen Samhristopher P Danford, MD   5 mg at 02/11/16 0155  . insulin aspart (novoLOG) injection 0-20 Units  0-20 Units Subcutaneous TID WC Alberteen Samhristopher P Danford, MD   7 Units at 02/11/16 1210  . insulin aspart (novoLOG) injection 0-5 Units  0-5 Units Subcutaneous QHS Alberteen Samhristopher P Danford, MD      . insulin glargine (LANTUS) injection 120 Units  120 Units Subcutaneous Q2200 Alberteen Samhristopher P Danford, MD   120 Units at 02/11/16 0228  . levofloxacin (LEVAQUIN) IVPB 500 mg  500 mg Intravenous Q24H Alberteen Samhristopher P Danford, MD      . metoprolol succinate (TOPROL-XL) 24 hr tablet 25 mg  25 mg Oral QPM Alberteen Samhristopher P Danford, MD      . ondansetron (ZOFRAN) tablet 4 mg  4 mg Oral Q6H PRN Alberteen Samhristopher P Danford, MD       Or  . ondansetron (ZOFRAN) injection 4 mg  4 mg Intravenous Q6H PRN Alberteen Samhristopher P Danford, MD      . oxyCODONE (Oxy IR/ROXICODONE) immediate release tablet 5 mg  5 mg Oral Q4H PRN Alberteen Samhristopher P Danford, MD   5 mg at 02/11/16 1424  . pantoprazole (PROTONIX) EC tablet 40 mg  40 mg Oral Daily Alberteen Samhristopher P Danford, MD   40 mg at 02/11/16 0946  . pravastatin (PRAVACHOL) tablet 40 mg  40 mg Oral QHS Alberteen Samhristopher P Danford, MD   40 mg at 02/11/16 0155  . senna-docusate (Senokot-S) tablet 1 tablet  1 tablet Oral QHS PRN Alberteen Samhristopher P Danford, MD        Allergies: Allergies  Allergen Reactions  . Latex Hives  . Codeine Other (See Comments)    constipation    Social History: Social History  Substance Use Topics  . Smoking status: Former Smoker    Packs/day: 2.00    Years: 30.00    Types: Cigarettes    Quit date: 09/23/2006  . Smokeless tobacco: Never Used  . Alcohol use No    Family History Family History  Problem Relation Age of Onset  . Diabetes Mother   . Cancer Mother   .  Diabetes Father   . Heart attack Father   . Colon cancer Neg Hx   . Colon polyps Neg Hx   . Kidney disease Neg Hx   . Gallbladder disease Neg Hx   . Esophageal cancer Neg Hx     Review of Systems 10 systems were reviewed and are negative except as noted specifically in the HPI.  Objective   Vital signs in last 24 hours: BP (!) 125/53 (BP Location: Left Arm)   Pulse 88   Temp 98 F (36.7 C) (Oral)   Resp 18   Ht 5\' 8"  (1.727 m)   Wt 220 lb (99.8 kg)   SpO2 97%   BMI 33.45 kg/m   Intake/Output  last 3 shifts: I/O last 3 completed shifts: In: 1485 [P.O.:510; I.V.:475; IV Piggyback:500] Out: 150 [Urine:150]  Physical Exam General: NAD, A&O, resting, appropriate HEENT: /AT, EOMI, MMM Pulmonary: Normal work of breathing on RA Cardiovascular: Regular rate & rhythm, HDS, adequate peripheral perfusion Abdomen: soft, NTTP, nondistended, no suprapubic fullness or tenderness, protuberant Gu: uncircumcised male phallus, no penile lesions, no urethral meatus discharge or blood, erythematous, indurated and TTP scrotum L > R, cannot palpate left testicle/epididymis well, right testicle feels normal with spermatocele, no areas of fluctuance, necrotic tissue or draining fluid DRE: not performed Extremities: warm and well perfused, no edema  Most Recent Labs: Lab Results  Component Value Date   WBC 22.3 (H) 02/11/2016   HGB 12.1 (L) 02/11/2016   HCT 36.2 (L) 02/11/2016   PLT 250 02/11/2016    Lab Results  Component Value Date   NA 134 (L) 02/11/2016   K 3.7 02/11/2016   CL 101 02/11/2016   CO2 26 02/11/2016   BUN 25 (H) 02/11/2016   CREATININE 1.19 02/11/2016   CALCIUM 8.3 (L) 02/11/2016   MG 2.0 01/09/2015    Lab Results  Component Value Date   ALKPHOS 77 01/08/2015   BILITOT 1.0 01/08/2015   BILIDIR 0.2 06/09/2010   PROT 6.0 (L) 01/08/2015   ALBUMIN 3.3 (L) 01/08/2015   ALT 23 01/08/2015   AST 18 01/08/2015    Lab Results  Component Value Date   INR 1.02  11/21/2009     Urine Culture: Pending   IMAGING: US Scrotum  Result Date: 02/10/2016 CLINICAL DATA:  Left-sided scrotal pain EXAM: ULTRASOUND OF SCROTUM TECHNIQUE: Complete ultrasound examination of the testicles, epididymis, and other scrotal structures was performed. COMPARISON:  None. FINDINGS: Right testicle Measurements: 5.1 x 1.9 x 2.8 cm. No mass or microlithiasis visualized. Left testicle Measurements: 4.8 x 2.9 x 3.5 cm. No mass or microlithiasis visualized. Right epididymis: Right epididymal cyst measuring 2.1 x 1.2 x 1.8 cm. Left epididymis:  Slightly enlarged and hypervascular. Hydrocele: Small right simple appearing hydrocele. Small left-sided hydrocele containing septation but no solid tissue. Varicocele:  Minimal right varicocele IMPRESSION: 1. No sonographic evidence for testicular torsion. Slightly enlarged and hyperemic left epididymis could relate to epididymitis. 2. Right epididymal cyst. 3. Trace right hydrocele.  Small septated left hydrocele. 4. Minimal right varicocele. Electronically Signed   By: Jasmine Pang M.D.   On: 02/10/2016 18:31   [place] Art/ven Flow Abd Pelv Doppler  Result Date: 02/10/2016 CLINICAL DATA:  Left-sided scrotal pain EXAM: ULTRASOUND OF SCROTUM TECHNIQUE: Complete ultrasound examination of the testicles, epididymis, and other scrotal structures was performed. COMPARISON:  None. FINDINGS: Right testicle Measurements: 5.1 x 1.9 x 2.8 cm. No mass or microlithiasis visualized. Left testicle Measurements: 4.8 x 2.9 x 3.5 cm. No mass or microlithiasis visualized. Right epididymis: Right epididymal cyst measuring 2.1 x 1.2 x 1.8 cm. Left epididymis:  Slightly enlarged and hypervascular. Hydrocele: Small right simple appearing hydrocele. Small left-sided hydrocele containing septation but no solid tissue. Varicocele:  Minimal right varicocele IMPRESSION: 1. No sonographic evidence for testicular torsion. Slightly enlarged and hyperemic left epididymis could relate  to epididymitis. 2. Right epididymal cyst. 3. Trace right hydrocele.  Small septated left hydrocele. 4. Minimal right varicocele. Electronically Signed   By: Jasmine Pang M.D.   On: 02/10/2016 18:31

## 2016-02-11 NOTE — Progress Notes (Signed)
PROGRESS NOTE    Todd MainlandChristopher M Krause  ZOX:[id number] DOB: 07/19/1950 DOA: 02/10/2016 PCP: Elizabeth PalauANDERSON,TERESA, FNP    Brief Narrative: 65 y.o. male with a past medical history significant for IDDM, HTN and BPH who presents with groin pain, redness, and swelling for 24 hours, found to have epididymitis.   Patient was admitted after midnight.  Assessment & Plan:   #  Sepsis, unspecified organism (HCC), due to Epididymitis:  -Admitted with fever, tachycardia, leukocytosis and epididymitis.  -Ultrasound of the scrotum reviewed. No sign of torsion. -Follow up culture results. Continue IV Levaquin and supportive care. -Continue with current pain management. She denied any problem with urinary symptoms. -Discussed with the patient and his wife at bedside. The wife is very adamant to get urology consult. Education provided to the patient and family. I discussed with the urologist today. -Monitor labs, leukocytosis.  # Essential hypertension: Monitor blood pressure. On metoprolol.  #  Type 2 diabetes mellitus without complication, with long-term current use of insulin (HCC): Continue current insulin regimen. Monitor blood sugar level.    #Acute kidney injury in the setting of infection and possible dehydration: Continue antibiotics and supportive care. Serum creatinine level trending down. Repeat lab. Avoid nephrotoxins.   #History of BPH: Continue finasteride  Continue current medical and supportive care.  DVT prophylaxis: Lovenox Code Status: Full code Family Communication: Wife and son at bedside Disposition Plan: Likely discharge home in 1-2 days   Consultants:   Consulted urologist  Procedures: None Antimicrobials: Levaquin is started on October 13  Subjective: Patient was seen and examined at bedside. Reportedly scrotal pain and swelling, pain improving with the medication. Denied nausea vomiting chills, chest pain, shortness of breath or abdominal pain. Denied dysuria or  urgency or any problem with urination. No diarrhea. Discussed with the patient's wife and son and my second visit to the patient's room.  Objective: Vitals:   02/10/16 1709 02/10/16 2222 02/11/16 0135 02/11/16 0553  BP: 130/57 (!) 111/48 (!) 128/50 (!) 125/53  Pulse: 94 78 80 88  Resp: 20 14 18 18   Temp: 100 F (37.8 C)  98.2 F (36.8 C) 98 F (36.7 C)  TempSrc: Oral  Oral Oral  SpO2:  95% 98% 97%  Weight: 99.8 kg (220 lb)  99.8 kg (220 lb)   Height:   5\' 8"  (1.727 m)     Intake/Output Summary (Last 24 hours) at 02/11/16 1408 Last data filed at 02/11/16 0807  Gross per 24 hour  Intake             1725 ml  Output              275 ml  Net             1450 ml   Filed Weights   02/10/16 1709 02/11/16 0135  Weight: 99.8 kg (220 lb) 99.8 kg (220 lb)    Examination:  General exam: Appears calm and comfortable  Respiratory system: Clear to auscultation. Respiratory effort normal. Cardiovascular system: S1 & S2 heard, RRR. No pedal edema. Gastrointestinal system: Abdomen is nondistended, soft and nontender. Normal bowel sounds heard. Central nervous system: Alert and oriented. No focal neurological deficits. Extremities: Symmetric 5 x 5 power. Skin: No esions or ulcers. Has scrotal redness Psychiatry: Judgement and insight appear normal. Mood & affect appropriate.  GU: Scrotal swelling, erythematous and tenderness.   Data Reviewed: I have personally reviewed following labs and imaging studies  CBC:  Recent Labs Lab 02/10/16 2223 02/11/16 0221  WBC 24.0* 22.3*  NEUTROABS 20.1*  --   HGB 14.6 12.1*  HCT 42.8 36.2*  MCV 85.9 86.2  PLT 304 250   Basic Metabolic Panel:  Recent Labs Lab 02/10/16 2223 02/11/16 0221  NA 134* 134*  K 3.8 3.7  CL 98* 101  CO2 24 26  GLUCOSE 100* 192*  BUN 21* 25*  CREATININE 1.35* 1.19  CALCIUM 9.3 8.3*   GFR: Estimated Creatinine Clearance: 69 mL/min (by C-G formula based on SCr of 1.19 mg/dL). Liver Function Tests: No  results for input(s): AST, ALT, ALKPHOS, BILITOT, PROT, ALBUMIN in the last 168 hours. No results for input(s): LIPASE, AMYLASE in the last 168 hours. No results for input(s): AMMONIA in the last 168 hours. Coagulation Profile: No results for input(s): INR, PROTIME in the last 168 hours. Cardiac Enzymes: No results for input(s): CKTOTAL, CKMB, CKMBINDEX, TROPONINI in the last 168 hours. BNP (last 3 results) No results for input(s): PROBNP in the last 8760 hours. HbA1C: No results for input(s): HGBA1C in the last 72 hours. CBG:  Recent Labs Lab 02/10/16 2004 02/11/16 0205 02/11/16 0717 02/11/16 1143  GLUCAP 117* 198* 185* 228*   Lipid Profile: No results for input(s): CHOL, HDL, LDLCALC, TRIG, CHOLHDL, LDLDIRECT in the last 72 hours. Thyroid Function Tests: No results for input(s): TSH, T4TOTAL, FREET4, T3FREE, THYROIDAB in the last 72 hours. Anemia Panel: No results for input(s): VITAMINB12, FOLATE, FERRITIN, TIBC, IRON, RETICCTPCT in the last 72 hours. Sepsis Labs:  Recent Labs Lab 02/10/16 2235 02/11/16 0221  LATICACIDVEN 2.10* 1.3    No results found for this or any previous visit (from the past 240 hour(s)).       Radiology Studies: US Scrotum  Result Date: 02/10/2016 CLINICAL DATA:  Left-sided scrotal pain EXAM: ULTRASOUND OF SCROTUM TECHNIQUE: Complete ultrasound examination of the testicles, epididymis, and other scrotal structures was performed. COMPARISON:  None. FINDINGS: Right testicle Measurements: 5.1 x 1.9 x 2.8 cm. No mass or microlithiasis visualized. Left testicle Measurements: 4.8 x 2.9 x 3.5 cm. No mass or microlithiasis visualized. Right epididymis: Right epididymal cyst measuring 2.1 x 1.2 x 1.8 cm. Left epididymis:  Slightly enlarged and hypervascular. Hydrocele: Small right simple appearing hydrocele. Small left-sided hydrocele containing septation but no solid tissue. Varicocele:  Minimal right varicocele IMPRESSION: 1. No sonographic evidence for  testicular torsion. Slightly enlarged and hyperemic left epididymis could relate to epididymitis. 2. Right epididymal cyst. 3. Trace right hydrocele.  Small septated left hydrocele. 4. Minimal right varicocele. Electronically Signed   By: Jasmine Pang M.D.   On: 02/10/2016 18:31   [place] Art/ven Flow Abd Pelv Doppler  Result Date: 02/10/2016 CLINICAL DATA:  Left-sided scrotal pain EXAM: ULTRASOUND OF SCROTUM TECHNIQUE: Complete ultrasound examination of the testicles, epididymis, and other scrotal structures was performed. COMPARISON:  None. FINDINGS: Right testicle Measurements: 5.1 x 1.9 x 2.8 cm. No mass or microlithiasis visualized. Left testicle Measurements: 4.8 x 2.9 x 3.5 cm. No mass or microlithiasis visualized. Right epididymis: Right epididymal cyst measuring 2.1 x 1.2 x 1.8 cm. Left epididymis:  Slightly enlarged and hypervascular. Hydrocele: Small right simple appearing hydrocele. Small left-sided hydrocele containing septation but no solid tissue. Varicocele:  Minimal right varicocele IMPRESSION: 1. No sonographic evidence for testicular torsion. Slightly enlarged and hyperemic left epididymis could relate to epididymitis. 2. Right epididymal cyst. 3. Trace right hydrocele.  Small septated left hydrocele. 4. Minimal right varicocele. Electronically Signed   By: Jasmine Pang M.D.   On: 02/10/2016 18:31  Scheduled Meds: . enoxaparin (LOVENOX) injection  40 mg Subcutaneous Q24H  . finasteride  5 mg Oral QHS  . insulin aspart  0-20 Units Subcutaneous TID WC  . insulin aspart  0-5 Units Subcutaneous QHS  . insulin glargine  120 Units Subcutaneous Q2200  . levofloxacin (LEVAQUIN) IV  500 mg Intravenous Q24H  . metoprolol succinate  25 mg Oral QPM  . pantoprazole  40 mg Oral Daily  . pravastatin  40 mg Oral QHS   Continuous Infusions:    LOS: 0 days    Time spent: 32 minutes    Shetara Launer Jaynie Collins, MD [facility] Pager [phone]  If 7PM-7AM, please contact  night-coverage [email or web address] Password [email or web address] 02/11/2016, 2:08 PM

## 2016-02-11 NOTE — H&P (Signed)
History and Physical  Patient Name: Todd Krause     OZH:[id number]    DOB: 01-31-1951    DOA: 02/10/2016 PCP: Elizabeth Palau, FNP   Patient coming from: Home  Chief Complaint: Groin pain, redness, and swelling  HPI: Todd Krause is a 65 y.o. male with a past medical history significant for IDDM, HTN and BPH who presents with groin pain, redness, and swelling for 24 hours.  The patient was in his usual state of health until Wednesday night when he was at church and young person ran into him in the groin really hard. It hurt for a minute, but afterwards he had no concerns until Thursday night when he got home from work and he had some pain in his scrotum. That night he had fever and chills overnight, and then all day today he had increasing constant aching left testicle pain, and eventually noticed redness and swelling in the left testicle. He went to see his PCP this afternoon, who diagnosed epididymitis and sent him to the emergency room because of tachycardia and leukocytosis.  ED course: -temperature 100 F, heart rate 94, respirations and pulse oximetry normal, blood pressure 130/57 -Na 134, K 3.8, Cr 1.35 (baseline 0.87), WBC 24K, Hgb 14.6, lactic acid 2.1 -ultrasound of the scrotum showed signs consistent with epididymitis without testicular torsion -cultures were obtained and he was given Levaquin and TRH were asked to evaluate for admission  He is not sexually active and has never had an STD. He follows with Dr. Ferd Hibbs at [facility] urology.  He sits all day in a car, delivering auto parts.     ROS: Review of Systems  Constitutional: Positive for chills and fever.  Genitourinary: Negative for dysuria, frequency, hematuria and urgency.       Groin pain, redness, swelling  All other systems reviewed and are negative.         Past Medical History:  Diagnosis Date  . Arthritis    hands, knees  . BPH (benign prostatic hypertrophy)   . Chronic bronchitis  (HCC)   . ED (erectile dysfunction)   . Elevated PSA   . GERD (gastroesophageal reflux disease)   . History of colon polyps   . Hyperlipidemia   . Hypertension   . Lower urinary tract symptoms (LUTS)   . OSA (obstructive sleep apnea)    mild to moderate osa per study 02-18-2004--  no cpap  . Prostate nodule   . Type 2 diabetes mellitus (HCC)     Past Surgical History:  Procedure Laterality Date  . 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 COLONOSCOPY  last one -- april 2016  . EYE SURGERY Bilateral    iol lens for cataracts  . melanoma removed from back  15 yrs ago  . PROSTATE BIOPSY N/A 10/01/2014   Procedure: BIOPSY TRANSRECTAL ULTRASONIC PROSTATE (TUBP);  Surgeon: Jethro Bolus, MD;  Location: [facility] : [facility];  Service: Urology;  Laterality: N/A;  . TRANSTHORACIC ECHOCARDIOGRAM  11-20-2009   grade I diastolic dysfunction/  ef 60-65%/  mild AR, MR, and TR/  trivial PR  . TRANSURETHRAL RESECTION OF PROSTATE N/A 05/09/2015   Procedure: CYSTOSCOPY TRANSURETHRAL RESECTION OF THE PROSTATE (TURP);  Surgeon: Jethro Bolus, MD;  Location: [facility];  Service: Urology;  Laterality: N/A;    Social History: Patient lives with his wife.  The  patient walks unassisted.  He works delivering parts.  He is a former smoker.    Allergies  Allergen Reactions  . Latex Hives  . Codeine Other (See Comments)    constipation    Family history: family history includes Cancer in his mother; Diabetes in his father and mother; Heart attack in his father.  Prior to Admission medications   Medication Sig Start Date End Date Taking? Authorizing Provider  acetaminophen (TYLENOL) 500 MG tablet Take 1,000 mg by mouth every 6 (six) hours as needed for moderate pain.    Yes Historical Provider, MD  albuterol (PROVENTIL HFA;VENTOLIN HFA) 108 (90 BASE) MCG/ACT inhaler Inhale 2  puffs into the lungs every 4 (four) hours as needed for wheezing or shortness of breath.  03/01/14  Yes Historical Provider, MD  albuterol (PROVENTIL) (2.5 MG/3ML) 0.083% nebulizer solution Take 2.5 mg by nebulization every 6 (six) hours as needed for wheezing or shortness of breath.    Yes Historical Provider, MD  Aspirin-Acetaminophen-Caffeine (GOODY HEADACHE PO) Take 1 each by mouth daily as needed (pain).   Yes Historical Provider, MD  Cholecalciferol (VITAMIN D3) 2000 UNITS TABS Take 1 capsule by mouth every evening.    Yes Historical Provider, MD  fenofibrate 54 MG tablet TAKE 1 TABLET EVERY DAY Patient taking differently: Take 54 mg by mouth every evening 07/10/13  Yes Romero Belling, MD  finasteride (PROSCAR) 5 MG tablet Take 1 tablet (5 mg total) by mouth daily. Patient taking differently: Take 5 mg by mouth every evening.  01/11/15  Yes Costin Otelia Sergeant, MD  Insulin Glargine (LANTUS) 100 UNIT/ML Solostar Pen Inject 120 Units into the skin daily at 10 pm.  04/19/14  Yes Historical Provider, MD  Liraglutide 18 MG/3ML SOPN Inject 1.8 mg into the skin every morning.   Yes Historical Provider, MD  lisinopril-hydrochlorothiazide (PRINZIDE,ZESTORETIC) 20-25 MG per tablet Take 1 tablet by mouth every morning.  03/01/14  Yes Historical Provider, MD  metFORMIN (GLUCOPHAGE) 1000 MG tablet Take 1,000 mg by mouth 2 (two) times daily. 08/03/14  Yes Historical Provider, MD  metoprolol succinate (TOPROL-XL) 25 MG 24 hr tablet Take 1 tablet (25 mg total) by mouth daily. Patient taking differently: Take 25 mg by mouth every evening.  05/13/13  Yes Romero Belling, MD  Multiple Vitamin (MULTIVITAMIN) tablet Take 1 tablet by mouth every evening.    Yes Historical Provider, MD  naproxen sodium (ANAPROX) 220 MG tablet Take 440 mg by mouth every 12 (twelve) hours as needed (pain).   Yes Historical Provider, MD  omeprazole (PRILOSEC) 20 MG capsule Take 1 capsule (20 mg total) by mouth daily. Patient taking differently: Take  20 mg by mouth 2 (two) times daily.  05/13/13  Yes Romero Belling, MD  pravastatin (PRAVACHOL) 40 MG tablet Take 40 mg by mouth at bedtime.     Yes Historical Provider, MD  Red Yeast Rice 600 MG CAPS Take 600 mg by mouth every evening.    Yes Historical Provider, MD  vitamin B-12 (CYANOCOBALAMIN) 1000 MCG tablet Take 1,000 mcg by mouth every evening.    Yes Historical Provider, MD       Physical Exam: BP (!) 111/48 (BP Location: Left Arm)   Pulse 78   Temp 100 F (37.8 C) (Oral)   Resp 14   Wt 99.8 kg (220 lb)   SpO2 95%   BMI 34.46 kg/m  General appearance: Well-developed, obese adult male, alert and in no acute distress.   Eyes: Anicteric, conjunctiva pink, lids and  lashes normal. PERRL.    ENT: No nasal deformity, discharge, epistaxis.  Hearing normal. OP moist without lesions.   Neck: No neck masses.  Trachea midline.  No thyromegaly/tenderness. Lymph: No cervical or supraclavicular lymphadenopathy. Skin: Warm and dry.  No jaundice.  No suspicious rashes or lesions. Cardiac: Tachycardic, nl S1-S2, no murmurs appreciated.  Capillary refill is brisk.  JVP not visible.  No LE edema.  Radial and DP pulses 2+ and symmetric. Respiratory: Normal respiratory rate and rhythm.  CTAB without rales or wheezes. Abdomen: Abdomen soft.  No TTP. No ascites, distension, hepatosplenomegaly.   MSK: No deformities or effusions.  No cyanosis or clubbing. GU: Penis normal.  Scrotum red and swollen, particularly, left testis, which is tender in the posterior/lateral side. Neuro: Cranial nerves normal.  Sensation intact to light touch. Speech is fluent.  Muscle strength nromal.    Psych: Sensorium intact and responding to questions, attention normal.  Behavior appropriate.  Affect normal.  Judgment and insight appear normal.     Labs on Admission:  I have personally reviewed following labs and imaging studies: CBC:  Recent Labs Lab 02/10/16 2223  WBC 24.0*  NEUTROABS 20.1*  HGB 14.6  HCT 42.8    MCV 85.9  PLT 304   Basic Metabolic Panel:  Recent Labs Lab 02/10/16 2223  NA 134*  K 3.8  CL 98*  CO2 24  GLUCOSE 100*  BUN 21*  CREATININE 1.35*  CALCIUM 9.3   GFR: CrCl cannot be calculated (Unknown ideal weight.).  Liver Function Tests: No results for input(s): AST, ALT, ALKPHOS, BILITOT, PROT, ALBUMIN in the last 168 hours. No results for input(s): LIPASE, AMYLASE in the last 168 hours. No results for input(s): AMMONIA in the last 168 hours. Coagulation Profile: No results for input(s): INR, PROTIME in the last 168 hours. Cardiac Enzymes: No results for input(s): CKTOTAL, CKMB, CKMBINDEX, TROPONINI in the last 168 hours. BNP (last 3 results) No results for input(s): PROBNP in the last 8760 hours. HbA1C: No results for input(s): HGBA1C in the last 72 hours. CBG:  Recent Labs Lab 02/10/16 2004  GLUCAP 117*   Lipid Profile: No results for input(s): CHOL, HDL, LDLCALC, TRIG, CHOLHDL, LDLDIRECT in the last 72 hours. Thyroid Function Tests: No results for input(s): TSH, T4TOTAL, FREET4, T3FREE, THYROIDAB in the last 72 hours. Anemia Panel: No results for input(s): VITAMINB12, FOLATE, FERRITIN, TIBC, IRON, RETICCTPCT in the last 72 hours. Sepsis Labs: Lactic acid 2.1 Invalid input(s): PROCALCITONIN, LACTICIDVEN No results found for this or any previous visit (from the past 240 hour(s)).       Radiological Exams on Admission: Personally reviewed: US Scrotum  Result Date: 02/10/2016 CLINICAL DATA:  Left-sided scrotal pain EXAM: ULTRASOUND OF SCROTUM TECHNIQUE: Complete ultrasound examination of the testicles, epididymis, and other scrotal structures was performed. COMPARISON:  None. FINDINGS: Right testicle Measurements: 5.1 x 1.9 x 2.8 cm. No mass or microlithiasis visualized. Left testicle Measurements: 4.8 x 2.9 x 3.5 cm. No mass or microlithiasis visualized. Right epididymis: Right epididymal cyst measuring 2.1 x 1.2 x 1.8 cm. Left epididymis:  Slightly  enlarged and hypervascular. Hydrocele: Small right simple appearing hydrocele. Small left-sided hydrocele containing septation but no solid tissue. Varicocele:  Minimal right varicocele IMPRESSION: 1. No sonographic evidence for testicular torsion. Slightly enlarged and hyperemic left epididymis could relate to epididymitis. 2. Right epididymal cyst. 3. Trace right hydrocele.  Small septated left hydrocele. 4. Minimal right varicocele. Electronically Signed   By: Jasmine Pang M.D.   On:  02/10/2016 18:31   [place] Art/ven Flow Abd Pelv Doppler  Result Date: 02/10/2016 CLINICAL DATA:  Left-sided scrotal pain EXAM: ULTRASOUND OF SCROTUM TECHNIQUE: Complete ultrasound examination of the testicles, epididymis, and other scrotal structures was performed. COMPARISON:  None. FINDINGS: Right testicle Measurements: 5.1 x 1.9 x 2.8 cm. No mass or microlithiasis visualized. Left testicle Measurements: 4.8 x 2.9 x 3.5 cm. No mass or microlithiasis visualized. Right epididymis: Right epididymal cyst measuring 2.1 x 1.2 x 1.8 cm. Left epididymis:  Slightly enlarged and hypervascular. Hydrocele: Small right simple appearing hydrocele. Small left-sided hydrocele containing septation but no solid tissue. Varicocele:  Minimal right varicocele IMPRESSION: 1. No sonographic evidence for testicular torsion. Slightly enlarged and hyperemic left epididymis could relate to epididymitis. 2. Right epididymal cyst. 3. Trace right hydrocele.  Small septated left hydrocele. 4. Minimal right varicocele. Electronically Signed   By: Jasmine PangKim  Fujinaga M.D.   On: 02/10/2016 18:31        Assessment/Plan  1. Sepsis:  Suspected source epididymitis. Organism unknown. Patient meets criteria given tachycardia, leukocytosis, and evidence of organ dysfunction.  Lactate 2.1 mmol/L and repeat ordered within 6 hours.  This patient is not at high risk of poor outcomes with a SOFA score of 0 (at least 2 of the following clinical criteria: respiratory rate  of 22/min or greater, altered mentation, or systolic blood pressure of 100 mm Hg or less).  Antibiotics delivered in the ED.    -Sepsis bundle utilized:  -Blood and urine cultures drawn  -Start targeted antibiotics with levaquin, based on suspected source of infection    -Repeat renal function and complete blood count in AM  -Code SEPSIS called to E-link  - Acetaminophen or oxycodone for pain, bowel regimen ordered       2. Elevated creatinine:  -Fluids -Check FeNA -Repeat BMP tomorrow  3. HTN:  -Continue metoprolol -Hold lisinopril-HCTZ until renal function repeated  4. IDDM:  -Continue glargine 120 nightly -Hold metformin and Victoza -SSI with meals  5. BPH:  -Continue finasteride  6. Other medications: -Continue PPI -Continue statin -Hold fenofibate -Hold albuterol        DVT prophylaxis: Lovenox  Code Status: FULL  Family Communication: Wife and friend at bedside  Disposition Plan: Anticipate IV fluids and antibiotics, follow urine and blood culture.  Follow renal function. Consults called: None overnight Admission status: INPATIENT         Medical decision making: Patient seen at 12:15 AM on 02/11/2016.  The patient was discussed with Arvilla MeresAshley Meyer, PA-C.  What exists of the patient's chart was reviewed in depth and summarized above.  Clinical condition: stable.        Alberteen SamChristopher P Trey Bebee [facility] Pager [phone]      At the time of admission, it appears that the appropriate admission status for this patient is INPATIENT. This is judged to be reasonable and necessary in order to provide the required intensity of service to ensure the patient's safety given the presenting symptoms, physical exam findings, and initial radiographic and laboratory data in the context of their chronic comorbidities.  Together, these circumstances are felt to place her/him at high risk for further clinical deterioration threatening life, limb, or organ.  I  certify that at the point of admission it is my clinical judgment that the patient will require inpatient hospital care spanning beyond 2 midnights from the point of admission and that early discharge would result in unnecessary risk of decompensation and readmission or threat to life, limb or  bodily function.

## 2016-02-12 DIAGNOSIS — I10 Essential (primary) hypertension: Secondary | ICD-10-CM

## 2016-02-12 LAB — CBC WITH DIFFERENTIAL/PLATELET
BASOS ABS: 0 10*3/uL (ref 0.0–0.1)
BASOS PCT: 0 %
EOS ABS: 0.1 10*3/uL (ref 0.0–0.7)
Eosinophils Relative: 1 %
HEMATOCRIT: 35.2 % — AB (ref 39.0–52.0)
Hemoglobin: 11.8 g/dL — ABNORMAL LOW (ref 13.0–17.0)
Lymphocytes Relative: 16 %
Lymphs Abs: 3.3 10*3/uL (ref 0.7–4.0)
MCH: 28.4 pg (ref 26.0–34.0)
MCHC: 33.5 g/dL (ref 30.0–36.0)
MCV: 84.6 fL (ref 78.0–100.0)
MONO ABS: 0.9 10*3/uL (ref 0.1–1.0)
MONOS PCT: 4 %
NEUTROS ABS: 16.2 10*3/uL — AB (ref 1.7–7.7)
Neutrophils Relative %: 79 %
PLATELETS: 247 10*3/uL (ref 150–400)
RBC: 4.16 MIL/uL — ABNORMAL LOW (ref 4.22–5.81)
RDW: 13.8 % (ref 11.5–15.5)
WBC: 20.5 10*3/uL — ABNORMAL HIGH (ref 4.0–10.5)

## 2016-02-12 LAB — GLUCOSE, CAPILLARY
GLUCOSE-CAPILLARY: 61 mg/dL — AB (ref 65–99)
Glucose-Capillary: 188 mg/dL — ABNORMAL HIGH (ref 65–99)
Glucose-Capillary: 145 mg/dL — ABNORMAL HIGH (ref 65–99)
Glucose-Capillary: 203 mg/dL — ABNORMAL HIGH (ref 65–99)

## 2016-02-12 LAB — BASIC METABOLIC PANEL
ANION GAP: 3 — AB (ref 5–15)
BUN: 14 mg/dL (ref 6–20)
CALCIUM: 8.7 mg/dL — AB (ref 8.9–10.3)
CO2: 28 mmol/L (ref 22–32)
CREATININE: 0.97 mg/dL (ref 0.61–1.24)
Chloride: 108 mmol/L (ref 101–111)
Glucose, Bld: 63 mg/dL — ABNORMAL LOW (ref 65–99)
Potassium: 4.6 mmol/L (ref 3.5–5.1)
SODIUM: 139 mmol/L (ref 135–145)

## 2016-02-12 LAB — URINE CULTURE: Culture: 60000 — AB

## 2016-02-12 MED ORDER — INSULIN GLARGINE 100 UNIT/ML ~~LOC~~ SOLN
100.0000 [IU] | Freq: Every day | SUBCUTANEOUS | Status: DC
  Administered 2016-02-12 – 2016-02-13 (×2): 100 [IU] via SUBCUTANEOUS
  Filled 2016-02-12 (×3): qty 1

## 2016-02-12 MED ORDER — INFLUENZA VAC SPLIT QUAD 0.5 ML IM SUSY
0.5000 mL | PREFILLED_SYRINGE | INTRAMUSCULAR | Status: AC
  Administered 2016-02-13: 0.5 mL via INTRAMUSCULAR
  Filled 2016-02-12: qty 0.5

## 2016-02-12 NOTE — Progress Notes (Signed)
PROGRESS NOTE    Todd Krause  ZOX:[id number] DOB: 1950/12/16 DOA: 02/10/2016 PCP: Elizabeth PalauANDERSON,TERESA, FNP    Brief Narrative: 65 y.o. male with a past medical history significant for IDDM, HTN and BPH who presents with groin pain, redness, and swelling for 24 hours, found to have epididymitis.   Patient was admitted after midnight.  Assessment & Plan:   #  Sepsis, unspecified organism (HCC), due to Epididymo-orchitis:  -Admitted with fever, tachycardia, leukocytosis and epididymitis.  -Ultrasound of the scrotum reviewed. No sign of torsion. -Follow up culture results.  -continue levaquin and IV vancomycin. Clinically improving.  -supportive care with pain control and ice compression. Patient reported that Percocet is helping his pain control. -No problem with urination. Leukocytosis improving. Monitor fever curve. - Urology consult appreciated.  # Essential hypertension: Monitor blood pressure. On metoprolol.  #  Type 2 diabetes mellitus without complication, with long-term current use of insulin [facility](HCC): Had a low blood sugar level this morning. I will reduce the dose of Lantus 200 units. Continue to monitor blood sugar level.    #Acute kidney injury in the setting of infection and possible dehydration: Serum creatinine level improved. Continue to monitor BMP. Avoid nephrotoxins.   #History of BPH: Continue finasteride  Continue current medical and supportive care.  DVT prophylaxis: Lovenox subcutaneous Code Status: Full code Family Communication: No family present at bedside. Disposition Plan: Likely discharge home in 1-2 days   Consultants:   Urology consult  Procedures: None Antimicrobials: Levaquin is started on October 13. IV vancomycin started on October 14.  Subjective: Patient was seen and examined at bedside. The pain and swelling is mildly improved today. Erythema is better than yesterday. Denies chills, headache, dizziness, nausea, vomiting, chest  pain, shortness of breath, abdominal pain. Denies any problem with urination. Denies diarrhea or constipation. Objective: Vitals:   02/11/16 2107 02/11/16 2207 02/12/16 0000 02/12/16 0524  BP: (!) 181/76   (!) 147/65  Pulse: 79   66  Resp: 18   18  Temp: (!) 101.2 F (38.4 C) 99.2 F (37.3 C) 98.2 F (36.8 C) 98.4 F (36.9 C)  TempSrc: Oral   Oral  SpO2: 98%   95%  Weight:      Height:        Intake/Output Summary (Last 24 hours) at 02/12/16 1225 Last data filed at 02/11/16 1700  Gross per 24 hour  Intake              740 ml  Output                0 ml  Net              740 ml   Filed Weights   02/10/16 1709 02/11/16 0135  Weight: 99.8 kg (220 lb) 99.8 kg (220 lb)    Examination:  General exam: Pleasant male lying comfortable. Respiratory system: Clear to auscultation bilateral, no wheezing or crackle. Cardiovascular system: Regular rate rhythm, S1-S2 normal. No lower extremity edema. Gastrointestinal system: Abdomen soft, nontender and nondistended. Bowel sound positive. Central nervous system: Alert, awake, oriented. No focal neurological deficit. Extremities: Symmetric 5 x 5 power. Skin: No esions or ulcers. Has scrotal redness Psychiatry: Judgement and insight appear normal. Mood & affect appropriate.  GU: Scrotal swelling and erythema mildly improving.   Data Reviewed: I have personally reviewed following labs and imaging studies  CBC:  Recent Labs Lab 02/10/16 2223 02/11/16 0221 02/12/16 0519  WBC 24.0* 22.3* 20.5*  NEUTROABS 20.1*  --  16.2*  HGB 14.6 12.1* 11.8*  HCT 42.8 36.2* 35.2*  MCV 85.9 86.2 84.6  PLT 304 250 247   Basic Metabolic Panel:  Recent Labs Lab 02/10/16 2223 02/11/16 0221 02/12/16 0519  NA 134* 134* 139  K 3.8 3.7 4.6  CL 98* 101 108  CO2 24 26 28   GLUCOSE 100* 192* 63*  BUN 21* 25* 14  CREATININE 1.35* 1.19 0.97  CALCIUM 9.3 8.3* 8.7*   GFR: Estimated Creatinine Clearance: 84.7 mL/min (by C-G formula based on SCr of  0.97 mg/dL). Liver Function Tests: No results for input(s): AST, ALT, ALKPHOS, BILITOT, PROT, ALBUMIN in the last 168 hours. No results for input(s): LIPASE, AMYLASE in the last 168 hours. No results for input(s): AMMONIA in the last 168 hours. Coagulation Profile: No results for input(s): INR, PROTIME in the last 168 hours. Cardiac Enzymes: No results for input(s): CKTOTAL, CKMB, CKMBINDEX, TROPONINI in the last 168 hours. BNP (last 3 results) No results for input(s): PROBNP in the last 8760 hours. HbA1C: No results for input(s): HGBA1C in the last 72 hours. CBG:  Recent Labs Lab 02/11/16 1143 02/11/16 1633 02/11/16 2113 02/12/16 0723 02/12/16 1200  GLUCAP 228* 140* 222* 61* 188*   Lipid Profile: No results for input(s): CHOL, HDL, LDLCALC, TRIG, CHOLHDL, LDLDIRECT in the last 72 hours. Thyroid Function Tests: No results for input(s): TSH, T4TOTAL, FREET4, T3FREE, THYROIDAB in the last 72 hours. Anemia Panel: No results for input(s): VITAMINB12, FOLATE, FERRITIN, TIBC, IRON, RETICCTPCT in the last 72 hours. Sepsis Labs:  Recent Labs Lab 02/10/16 2235 02/11/16 0221  LATICACIDVEN 2.10* 1.3    No results found for this or any previous visit (from the past 240 hour(s)).       Radiology Studies: US Scrotum  Result Date: 02/10/2016 CLINICAL DATA:  Left-sided scrotal pain EXAM: ULTRASOUND OF SCROTUM TECHNIQUE: Complete ultrasound examination of the testicles, epididymis, and other scrotal structures was performed. COMPARISON:  None. FINDINGS: Right testicle Measurements: 5.1 x 1.9 x 2.8 cm. No mass or microlithiasis visualized. Left testicle Measurements: 4.8 x 2.9 x 3.5 cm. No mass or microlithiasis visualized. Right epididymis: Right epididymal cyst measuring 2.1 x 1.2 x 1.8 cm. Left epididymis:  Slightly enlarged and hypervascular. Hydrocele: Small right simple appearing hydrocele. Small left-sided hydrocele containing septation but no solid tissue. Varicocele:  Minimal  right varicocele IMPRESSION: 1. No sonographic evidence for testicular torsion. Slightly enlarged and hyperemic left epididymis could relate to epididymitis. 2. Right epididymal cyst. 3. Trace right hydrocele.  Small septated left hydrocele. 4. Minimal right varicocele. Electronically Signed   By: Jasmine Pang M.D.   On: 02/10/2016 18:31   [place] Art/ven Flow Abd Pelv Doppler  Result Date: 02/10/2016 CLINICAL DATA:  Left-sided scrotal pain EXAM: ULTRASOUND OF SCROTUM TECHNIQUE: Complete ultrasound examination of the testicles, epididymis, and other scrotal structures was performed. COMPARISON:  None. FINDINGS: Right testicle Measurements: 5.1 x 1.9 x 2.8 cm. No mass or microlithiasis visualized. Left testicle Measurements: 4.8 x 2.9 x 3.5 cm. No mass or microlithiasis visualized. Right epididymis: Right epididymal cyst measuring 2.1 x 1.2 x 1.8 cm. Left epididymis:  Slightly enlarged and hypervascular. Hydrocele: Small right simple appearing hydrocele. Small left-sided hydrocele containing septation but no solid tissue. Varicocele:  Minimal right varicocele IMPRESSION: 1. No sonographic evidence for testicular torsion. Slightly enlarged and hyperemic left epididymis could relate to epididymitis. 2. Right epididymal cyst. 3. Trace right hydrocele.  Small septated left hydrocele. 4. Minimal right varicocele. Electronically Signed   By: Selena Batten  Jake Samples M.D.   On: 02/10/2016 18:31        Scheduled Meds: . enoxaparin (LOVENOX) injection  40 mg Subcutaneous Q24H  . finasteride  5 mg Oral QHS  . insulin aspart  0-20 Units Subcutaneous TID WC  . insulin aspart  0-5 Units Subcutaneous QHS  . insulin glargine  120 Units Subcutaneous Q2200  . levofloxacin (LEVAQUIN) IV  500 mg Intravenous Q24H  . metoprolol succinate  25 mg Oral QPM  . pantoprazole  40 mg Oral Daily  . pravastatin  40 mg Oral QHS  . vancomycin  1,000 mg Intravenous Q12H   Continuous Infusions:    LOS: 1 day    Time spent:25  minutes    Khole Arterburn Jaynie Collins, MD [facility] Pager [phone]  If 7PM-7AM, please contact night-coverage [email or web address] Password [email or web address] 02/12/2016, 12:25 PM

## 2016-02-12 NOTE — Consult Note (Signed)
Urology Consult Note   Requesting Attending Physician:  Maxie Barbron Prasad Bhandari, MD Service Providing Consult: Urology Consulting Attending: Mena GoesEskridge, MD  Assessment:  Patient is a 65 y.o. male with history of BPH s/p TURP admitted with lactate 2.1, WBC 24, left scrotal pain and edema with ultrasound suggestive of left sided epididymitis, with left scrotal cellulitis apparent on physical exam as well. There is scrotal edema noted on ultrasound as well.  Interval: Tmax isolated episode yesterday of 101.2, now afebrile with normal HR and hypertensive. Only 125cc UOP recorded but patient states he is voiding well. Cr 0.9 from 1.1. WBC 20 from 22. Urine & blood cultures pending. On Vanc/Levo.   Recommendations: 1. Continue vancomycin & levaquin. Epididymitis can present with external scrotal findings (induration, some erythema) however patient does appear to have cellulitis on exam as well, so would cover for both at this point 2. Ice PRN to scrotum 3. Elevate scrotum using towel while lying flat and use mesh underwear/jock strap for scrotal support 4. NSAIDs for pain control 5. Follow up urine & blood cultures  Thank you for this consult. Please contact the urology consult pager with any further questions/concerns. Todd MulletMatthew Kyan Giannone, MD Urology Surgical Resident   Subjective: Thinks scrotal pain and swelling are better today. Doing OK with eating/drinking. Pain controlled.   Objective   Vital signs in last 24 hours: BP (!) 147/65 (BP Location: Left Arm)   Pulse 66   Temp 98.4 F (36.9 C) (Oral)   Resp 18   Ht 5\' 8"  (1.727 m)   Wt 220 lb (99.8 kg)   SpO2 95%   BMI 33.45 kg/m   Intake/Output last 3 shifts: I/O last 3 completed shifts: In: 2705 [P.O.:1230; I.V.:475; IV Piggyback:1000] Out: 275 [Urine:275]  Physical Exam General: NAD, A&O, resting, appropriate HEENT: [place]/AT, EOMI, MMM Pulmonary: Normal work of breathing on RA Cardiovascular: Regular rate & rhythm, HDS, adequate  peripheral perfusion Abdomen: soft, NTTP, nondistended, no suprapubic fullness or tenderness, protuberant Gu: uncircumcised male phallus, no penile lesions, no urethral meatus discharge or blood, erythematous, indurated and TTP scrotum L > R, cannot palpate left testicle/epididymis well, right testicle feels normal with spermatocele, no areas of fluctuance, necrotic tissue, crepitus or draining fluid; erythema seems improved today DRE: not performed Extremities: warm and well perfused, no edema  Most Recent Labs: Lab Results  Component Value Date   WBC 20.5 (H) 02/12/2016   HGB 11.8 (L) 02/12/2016   HCT 35.2 (L) 02/12/2016   PLT 247 02/12/2016    Lab Results  Component Value Date   NA 139 02/12/2016   K 4.6 02/12/2016   CL 108 02/12/2016   CO2 28 02/12/2016   BUN 14 02/12/2016   CREATININE 0.97 02/12/2016   CALCIUM 8.7 (L) 02/12/2016   MG 2.0 01/09/2015    Lab Results  Component Value Date   ALKPHOS 77 01/08/2015   BILITOT 1.0 01/08/2015   BILIDIR 0.2 06/09/2010   PROT 6.0 (L) 01/08/2015   ALBUMIN 3.3 (L) 01/08/2015   ALT 23 01/08/2015   AST 18 01/08/2015    Lab Results  Component Value Date   INR 1.02 11/21/2009     Urine Culture: Pending   IMAGING: [place] Scrotum  Result Date: 02/10/2016 CLINICAL DATA:  Left-sided scrotal pain EXAM: ULTRASOUND OF SCROTUM TECHNIQUE: Complete ultrasound examination of the testicles, epididymis, and other scrotal structures was performed. COMPARISON:  None. FINDINGS: Right testicle Measurements: 5.1 x 1.9 x 2.8 cm. No mass or microlithiasis visualized. Left testicle Measurements: 4.8  x 2.9 x 3.5 cm. No mass or microlithiasis visualized. Right epididymis: Right epididymal cyst measuring 2.1 x 1.2 x 1.8 cm. Left epididymis:  Slightly enlarged and hypervascular. Hydrocele: Small right simple appearing hydrocele. Small left-sided hydrocele containing septation but no solid tissue. Varicocele:  Minimal right varicocele IMPRESSION: 1. No  sonographic evidence for testicular torsion. Slightly enlarged and hyperemic left epididymis could relate to epididymitis. 2. Right epididymal cyst. 3. Trace right hydrocele.  Small septated left hydrocele. 4. Minimal right varicocele. Electronically Signed   By: Jasmine Pang M.D.   On: 02/10/2016 18:31   [place] Art/ven Flow Abd Pelv Doppler  Result Date: 02/10/2016 CLINICAL DATA:  Left-sided scrotal pain EXAM: ULTRASOUND OF SCROTUM TECHNIQUE: Complete ultrasound examination of the testicles, epididymis, and other scrotal structures was performed. COMPARISON:  None. FINDINGS: Right testicle Measurements: 5.1 x 1.9 x 2.8 cm. No mass or microlithiasis visualized. Left testicle Measurements: 4.8 x 2.9 x 3.5 cm. No mass or microlithiasis visualized. Right epididymis: Right epididymal cyst measuring 2.1 x 1.2 x 1.8 cm. Left epididymis:  Slightly enlarged and hypervascular. Hydrocele: Small right simple appearing hydrocele. Small left-sided hydrocele containing septation but no solid tissue. Varicocele:  Minimal right varicocele IMPRESSION: 1. No sonographic evidence for testicular torsion. Slightly enlarged and hyperemic left epididymis could relate to epididymitis. 2. Right epididymal cyst. 3. Trace right hydrocele.  Small septated left hydrocele. 4. Minimal right varicocele. Electronically Signed   By: Jasmine Pang M.D.   On: 02/10/2016 18:31

## 2016-02-13 DIAGNOSIS — Z794 Long term (current) use of insulin: Secondary | ICD-10-CM

## 2016-02-13 DIAGNOSIS — E119 Type 2 diabetes mellitus without complications: Secondary | ICD-10-CM

## 2016-02-13 LAB — CBC WITH DIFFERENTIAL/PLATELET
BASOS ABS: 0 10*3/uL (ref 0.0–0.1)
BASOS PCT: 0 %
EOS ABS: 0.3 10*3/uL (ref 0.0–0.7)
EOS PCT: 3 %
HCT: 34.2 % — ABNORMAL LOW (ref 39.0–52.0)
Hemoglobin: 11.4 g/dL — ABNORMAL LOW (ref 13.0–17.0)
Lymphocytes Relative: 24 %
Lymphs Abs: 2.9 10*3/uL (ref 0.7–4.0)
MCH: 28.1 pg (ref 26.0–34.0)
MCHC: 33.3 g/dL (ref 30.0–36.0)
MCV: 84.2 fL (ref 78.0–100.0)
Monocytes Absolute: 0.7 10*3/uL (ref 0.1–1.0)
Monocytes Relative: 6 %
Neutro Abs: 8.3 10*3/uL — ABNORMAL HIGH (ref 1.7–7.7)
Neutrophils Relative %: 67 %
PLATELETS: 283 10*3/uL (ref 150–400)
RBC: 4.06 MIL/uL — AB (ref 4.22–5.81)
RDW: 13.8 % (ref 11.5–15.5)
WBC: 12.2 10*3/uL — AB (ref 4.0–10.5)

## 2016-02-13 LAB — GLUCOSE, CAPILLARY
GLUCOSE-CAPILLARY: 79 mg/dL (ref 65–99)
GLUCOSE-CAPILLARY: 158 mg/dL — AB (ref 65–99)
GLUCOSE-CAPILLARY: 185 mg/dL — AB (ref 65–99)
Glucose-Capillary: 179 mg/dL — ABNORMAL HIGH (ref 65–99)

## 2016-02-13 NOTE — Progress Notes (Signed)
PROGRESS NOTE    Todd Krause  ZOX:[id number] DOB: 03/30/1951 DOA: 02/10/2016 PCP: Elizabeth Palau, FNP    Brief Narrative: 65 y.o. male with a past medical history significant for IDDM, HTN and BPH who presents with groin pain, redness, and swelling for 24 hours, found to have epididymitis.   Patient was admitted after midnight.  Assessment & Plan:   #  Sepsis, unspecified organism (HCC), due to Epididymo-orchitis: Urine culture growing group B streptococcus. Pt is diabetic which can be the risk factor. -Admitted with fever, tachycardia, leukocytosis and epididymitis.  -Ultrasound of the scrotum reviewed. No sign of torsion. -Follow up final culture results.  -discontinue levaquin. Plan to continue IV vancomycin and possibly switch to oral augmentin tomorrow.   -No significant improvement in swelling. supportive care with pain control and ice compression. Patient reported that Percocet is helping his pain control. -No problem with urination. Leukocytosis continues to improve. Monitor fever curve. - Urology consult appreciated.  # Essential hypertension: Monitor blood pressure. On metoprolol.  #  Type 2 diabetes mellitus without complication, with long-term current use of insulin (HCC): continue current insulin regimen. Monitor blood sugar level.     #Acute kidney injury in the setting of infection and possible dehydration: Serum creatinine level improved. Continue to monitor BMP. Avoid nephrotoxins.   #History of BPH: Continue finasteride  Continue current medical and supportive care.  DVT prophylaxis: Lovenox subcutaneous Code Status: Full code Family Communication: No family present at bedside. Disposition Plan: Likely discharge home in 1-2 days   Consultants:   Urology consult  Procedures: None Antimicrobials:  IV vancomycin started on October 14. DC levaquin today.  Subjective: Patient was seen and examined at bedside. The patient reported pain is mildly  improved today but no significant improvement in swelling. Redness is somewhat better. Denies fever, chills, nausea, vomiting, chest pain or shortness of breath. Patient reported requiring pain medication for scrotal pain.  Objective: Vitals:   02/12/16 0524 02/12/16 1515 02/12/16 2041 02/13/16 0500  BP: (!) 147/65 131/61 138/63 (!) 153/73  Pulse: 66 67 60 67  Resp: 18 18 18    Temp: 98.4 F (36.9 C) 98.4 F (36.9 C) 98.9 F (37.2 C) 98.8 F (37.1 C)  TempSrc: Oral Oral Oral Oral  SpO2: 95% 94% 96% 95%  Weight:      Height:        Intake/Output Summary (Last 24 hours) at 02/13/16 1309 Last data filed at 02/13/16 8119  Gross per 24 hour  Intake              320 ml  Output                0 ml  Net              320 ml   Filed Weights   02/10/16 1709 02/11/16 0135  Weight: 99.8 kg (220 lb) 99.8 kg (220 lb)    Examination:  General exam: Pleasant male lying comfortably. Respiratory system: Clear to auscultation bilateral, no wheezing or crackle. Cardiovascular system: Regular rate rhythm, S1 and S2 normal. No lower extremity edema. Gastrointestinal system: Abdomen soft, nontender, nondistended. Thousand positive.. Central nervous system: Alert, awake, oriented. No focal neurological deficit Extremities: Symmetric 5 x 5 power. Skin: No esions or ulcers. Has scrotal redness Psychiatry: Judgement and insight appear normal. Mood & affect appropriate.  GU: Scrotal erythema is improving. Scrotal swelling +.   Data Reviewed: I have personally reviewed following labs and imaging studies  CBC:  Recent  Labs Lab 02/10/16 2223 02/11/16 0221 02/12/16 0519 02/13/16 0512  WBC 24.0* 22.3* 20.5* 12.2*  NEUTROABS 20.1*  --  16.2* 8.3*  HGB 14.6 12.1* 11.8* 11.4*  HCT 42.8 36.2* 35.2* 34.2*  MCV 85.9 86.2 84.6 84.2  PLT 304 250 247 283   Basic Metabolic Panel:  Recent Labs Lab 02/10/16 2223 02/11/16 0221 02/12/16 0519  NA 134* 134* 139  K 3.8 3.7 4.6  CL 98* 101 108  CO2  24 26 28   GLUCOSE 100* 192* 63*  BUN 21* 25* 14  CREATININE 1.35* 1.19 0.97  CALCIUM 9.3 8.3* 8.7*   GFR: Estimated Creatinine Clearance: 84.7 mL/min (by C-G formula based on SCr of 0.97 mg/dL). Liver Function Tests: No results for input(s): AST, ALT, ALKPHOS, BILITOT, PROT, ALBUMIN in the last 168 hours. No results for input(s): LIPASE, AMYLASE in the last 168 hours. No results for input(s): AMMONIA in the last 168 hours. Coagulation Profile: No results for input(s): INR, PROTIME in the last 168 hours. Cardiac Enzymes: No results for input(s): CKTOTAL, CKMB, CKMBINDEX, TROPONINI in the last 168 hours. BNP (last 3 results) No results for input(s): PROBNP in the last 8760 hours. HbA1C: No results for input(s): HGBA1C in the last 72 hours. CBG:  Recent Labs Lab 02/12/16 1200 02/12/16 1640 02/12/16 2237 02/13/16 0729 02/13/16 1229  GLUCAP 188* 145* 203* 79 158*   Lipid Profile: No results for input(s): CHOL, HDL, LDLCALC, TRIG, CHOLHDL, LDLDIRECT in the last 72 hours. Thyroid Function Tests: No results for input(s): TSH, T4TOTAL, FREET4, T3FREE, THYROIDAB in the last 72 hours. Anemia Panel: No results for input(s): VITAMINB12, FOLATE, FERRITIN, TIBC, IRON, RETICCTPCT in the last 72 hours. Sepsis Labs:  Recent Labs Lab 02/10/16 2235 02/11/16 0221  LATICACIDVEN 2.10* 1.3    Recent Results (from the past 240 hour(s))  Urine culture     Status: Abnormal   Collection Time: 02/10/16 12:03 AM  Result Value Ref Range Status   Specimen Description URINE, CLEAN CATCH  Final   Special Requests NONE  Final   Culture (A)  Final    60,000 COLONIES/mL GROUP B STREP(S.AGALACTIAE)ISOLATED TESTING AGAINST S. AGALACTIAE NOT ROUTINELY PERFORMED DUE TO PREDICTABILITY OF AMP/PEN/VAN SUSCEPTIBILITY. [facility]    Report Status 02/12/2016 FINAL  Final         Radiology Studies: No results found.      Scheduled Meds: . enoxaparin (LOVENOX) injection   40 mg Subcutaneous Q24H  . finasteride  5 mg Oral QHS  . insulin aspart  0-20 Units Subcutaneous TID WC  . insulin aspart  0-5 Units Subcutaneous QHS  . insulin glargine  100 Units Subcutaneous Q2200  . metoprolol succinate  25 mg Oral QPM  . pantoprazole  40 mg Oral Daily  . pravastatin  40 mg Oral QHS  . vancomycin  1,000 mg Intravenous Q12H   Continuous Infusions:    LOS: 2 days    Time spent:23 minutes    Jaylin Benzel Jaynie CollinsPrasad Aiya Keach, MD [facility] Pager [phone]  If 7PM-7AM, please contact night-coverage [email or web address] Password [email or web address] 02/13/2016, 1:09 PM

## 2016-02-13 NOTE — Consult Note (Signed)
Urology Consult Note   Requesting Attending Physician:  Maxie Barbron Prasad Bhandari, MD Service Providing Consult: Urology Consulting Attending: Patsi Searsannenbaum, MD  Assessment:  Patient is a 65 y.o. male with history of BPH s/p TURP admitted with lactate 2.1, WBC 24, left scrotal pain and edema with ultrasound suggestive of left sided epididymitis, with left scrotal cellulitis apparent on physical exam as well. There is scrotal edema noted on ultrasound as well.  Interval: AFVSS. 4 voids. WBC 12 from 20. Urine culture showing Group B Strep. On vanc/levo. Physical exam improving.  Recommendations: 1. Defer to medicine on antibiotics. Would seem reasonable to attempt transition to PO antibiotics in near future based on urine culture (augmentin or cephalosporin?). Consider transition as early as today vs tomorrow to give him another day of IV antibiotics. Likely can discontinue levaquin 2. Ice PRN to scrotum 3. Elevate scrotum using towel while lying flat and use mesh underwear/jock strap for scrotal support 4. NSAIDs for pain control 5. Follow up blood cultures 6. Recommend trending WBC  Thank you for this consult. Please contact the urology consult pager with any further questions/concerns. Jalene MulletMatthew Nell Gales, MD Urology Surgical Resident   Subjective: Eating/drinking well. Pain better. Swelling decreasing. + OOB. + flatus/BM. No n/v. No fevers.  Objective   Vital signs in last 24 hours: BP (!) 153/73 (BP Location: Left Arm)   Pulse 67   Temp 98.8 F (37.1 C) (Oral)   Resp 18   Ht 5\' 8"  (1.727 m)   Wt 220 lb (99.8 kg)   SpO2 95%   BMI 33.45 kg/m   Intake/Output last 3 shifts: I/O last 3 completed shifts: In: 320 [P.O.:120; IV Piggyback:200] Out: -   Physical Exam General: NAD, A&O, resting, appropriate HEENT: [place]/AT, EOMI, MMM Pulmonary: Normal work of breathing on RA Cardiovascular: Regular rate & rhythm, HDS, adequate peripheral perfusion Abdomen: soft, NTTP, nondistended, no  suprapubic fullness or tenderness, protuberant Gu: uncircumcised male phallus, no penile lesions, no urethral meatus discharge or blood, erythematous, indurated and TTP scrotum L > R, cannot palpate left testicle/epididymis well, right testicle feels normal with spermatocele, no areas of fluctuance, necrotic tissue, crepitus or draining fluid; erythema continues to improve, induration about the left hemiscrotum DRE: not performed Extremities: warm and well perfused, no edema  Most Recent Labs: Lab Results  Component Value Date   WBC 12.2 (H) 02/13/2016   HGB 11.4 (L) 02/13/2016   HCT 34.2 (L) 02/13/2016   PLT 283 02/13/2016    Lab Results  Component Value Date   NA 139 02/12/2016   K 4.6 02/12/2016   CL 108 02/12/2016   CO2 28 02/12/2016   BUN 14 02/12/2016   CREATININE 0.97 02/12/2016   CALCIUM 8.7 (L) 02/12/2016   MG 2.0 01/09/2015    Lab Results  Component Value Date   ALKPHOS 77 01/08/2015   BILITOT 1.0 01/08/2015   BILIDIR 0.2 06/09/2010   PROT 6.0 (L) 01/08/2015   ALBUMIN 3.3 (L) 01/08/2015   ALT 23 01/08/2015   AST 18 01/08/2015    Lab Results  Component Value Date   INR 1.02 11/21/2009     Urine Culture: Group B Strep 02/10/16   IMAGING: No results found.

## 2016-02-14 LAB — CBC WITH DIFFERENTIAL/PLATELET
Basophils Absolute: 0 10*3/uL (ref 0.0–0.1)
Basophils Relative: 0 %
EOS ABS: 0.4 10*3/uL (ref 0.0–0.7)
Eosinophils Relative: 4 %
HCT: 35.3 % — ABNORMAL LOW (ref 39.0–52.0)
HEMOGLOBIN: 11.7 g/dL — AB (ref 13.0–17.0)
LYMPHS ABS: 2.7 10*3/uL (ref 0.7–4.0)
LYMPHS PCT: 27 %
MCH: 28.5 pg (ref 26.0–34.0)
MCHC: 33.1 g/dL (ref 30.0–36.0)
MCV: 85.9 fL (ref 78.0–100.0)
MONOS PCT: 7 %
Monocytes Absolute: 0.7 10*3/uL (ref 0.1–1.0)
NEUTROS PCT: 62 %
Neutro Abs: 6.4 10*3/uL (ref 1.7–7.7)
Platelets: 316 10*3/uL (ref 150–400)
RBC: 4.11 MIL/uL — ABNORMAL LOW (ref 4.22–5.81)
RDW: 13.5 % (ref 11.5–15.5)
WBC: 10.3 10*3/uL (ref 4.0–10.5)

## 2016-02-14 LAB — GLUCOSE, CAPILLARY
GLUCOSE-CAPILLARY: 77 mg/dL (ref 65–99)
Glucose-Capillary: 157 mg/dL — ABNORMAL HIGH (ref 65–99)

## 2016-02-14 MED ORDER — AMOXICILLIN-POT CLAVULANATE 875-125 MG PO TABS: 1.0000 | ORAL_TABLET | Freq: Two times a day (BID) | ORAL | 0 refills | Status: AC

## 2016-02-14 NOTE — Discharge Summary (Signed)
Physician Discharge Summary  Mardi MainlandChristopher M Keahey ZOX:[id number] DOB: [birth year] DOA: 02/10/2016  PCP: Elizabeth PalauANDERSON,TERESA, FNP  Admit date: 02/10/2016 Discharge date: 02/14/2016  Admitted From: Home Disposition: Home  Recommendations for Outpatient Follow-up:  1. Follow up with PCP in 1-2 weeks 2. Please obtain BMP/CBC in one week 3. Please follow up on the following pending results:final blood culture result.  Home Health: No Equipment/Devices: None  Discharge Condition: Stable CODE STATUS: Full code Diet recommendation: Carb Modified heart healthy diet  Brief/Interim Summary:65 y.o.malewith a past medical history significant for IDDM, HTN and BPHwho presents with groin pain, redness, and swelling for 24 hours, found to have epididymo-orchitis.  Please see below for detail:  #  Sepsis due to Epididymo-orchitis: Urine culture growing group B streptococcus. Pt is diabetic which can be the risk factor. -Admitted with fever, tachycardia, leukocytosis and epididymitis.  -Ultrasound of the scrotum reviewed. No sign of torsion. -treated with IV antibiotics. Discharge with oral augmentin for 7 days (culture growing strep) -clinically improved. Minimal swelling and erythema. No pain today. -No problem with urination. Leukocytosis continues to improve. . - Urology consult appreciated.  # Essential hypertension: Monitor blood pressure. Resume home medications. Advised to follow up with PCP/  #  Type 2 diabetes mellitus without complication, with long-term current use of insulin (HCC): continue current insulin regimen. Monitor blood sugar level.     #Acute kidney injury in the setting of infection and possible dehydration: Serum creatinine level improved. Continue to monitor BMP. Avoid nephrotoxins.   #History of BPH: Continue finasteride  The patient clinically improved. Denies scrotal pain. Erythema and swelling significantly improved. He is able to tolerate diet with no nausea  or vomiting. Denies any urinary symptoms. Plan to discharge with oral antibiotics with outpatient follow-up. I educated patient to take probiotic or yogurt while on antibiotics. Also recommended to follow-up with his PCP and urologist as an outpatient. Patient verbalized understanding of follow-up instructions. He is discharged home in stable condition.  Discharge Diagnoses:  Principal Problem:   Sepsis, unspecified organism [facility](HCC) Active Problems:   Essential hypertension   Type 2 diabetes mellitus without complication, with long-term current use of insulin (HCC)   Benign prostatic hyperplasia   Elevated serum creatinine   Epididymitis   Sepsis (HCC)   Acute kidney injury [facility](HCC)    Discharge Instructions  Discharge Instructions    Call MD for:  difficulty breathing, headache or visual disturbances    Complete by:  As directed    Call MD for:  hives    Complete by:  As directed    Call MD for:  persistant dizziness or light-headedness    Complete by:  As directed    Call MD for:  severe uncontrolled pain    Complete by:  As directed    Call MD for:  temperature >100.4    Complete by:  As directed    Diet Carb Modified    Complete by:  As directed    Increase activity slowly    Complete by:  As directed        Medication List    TAKE these medications   acetaminophen 500 MG tablet Commonly known as:  TYLENOL Take 1,000 mg by mouth every 6 (six) hours as needed for moderate pain.   albuterol (2.5 MG/3ML) 0.083% nebulizer solution Commonly known as:  PROVENTIL Take 2.5 mg by nebulization every 6 (six) hours as needed for wheezing or shortness of breath.   albuterol 108 (90 Base) MCG/ACT inhaler Commonly  known as:  PROVENTIL HFA;VENTOLIN HFA Inhale 2 puffs into the lungs every 4 (four) hours as needed for wheezing or shortness of breath.   amoxicillin-clavulanate 875-125 MG tablet Commonly known as:  AUGMENTIN Take 1 tablet by mouth 2 (two) times daily.   fenofibrate  54 MG tablet TAKE 1 TABLET EVERY DAY What changed:  See the new instructions.   finasteride 5 MG tablet Commonly known as:  PROSCAR Take 1 tablet (5 mg total) by mouth daily. What changed:  when to take this   GLUCOPHAGE 1000 MG tablet Generic drug:  metFORMIN Take 1,000 mg by mouth 2 (two) times daily.   GOODY HEADACHE PO Take 1 each by mouth daily as needed (pain).   Insulin Glargine 100 UNIT/ML Solostar Pen Commonly known as:  LANTUS Inject 120 Units into the skin daily at 10 pm.   liraglutide 18 MG/3ML Sopn Inject 1.8 mg into the skin every morning.   lisinopril-hydrochlorothiazide 20-25 MG tablet Commonly known as:  PRINZIDE,ZESTORETIC Take 1 tablet by mouth every morning.   metoprolol succinate 25 MG 24 hr tablet Commonly known as:  TOPROL-XL Take 1 tablet (25 mg total) by mouth daily. What changed:  when to take this   multivitamin tablet Take 1 tablet by mouth every evening.   naproxen sodium 220 MG tablet Commonly known as:  ANAPROX Take 440 mg by mouth every 12 (twelve) hours as needed (pain).   omeprazole 20 MG capsule Commonly known as:  PRILOSEC Take 1 capsule (20 mg total) by mouth daily. What changed:  when to take this   pravastatin 40 MG tablet Commonly known as:  PRAVACHOL Take 40 mg by mouth at bedtime.   Red Yeast Rice 600 MG Caps Take 600 mg by mouth every evening.   vitamin B-12 1000 MCG tablet Commonly known as:  CYANOCOBALAMIN Take 1,000 mcg by mouth every evening.   Vitamin D3 2000 units Tabs Take 1 capsule by mouth every evening.      Follow-up Information    ANDERSON,TERESA, FNP. Schedule an appointment as soon as possible for a visit in 1 week(s).   Specialty:  Nurse Practitioner Contact information: [address] [place] [address] [phone]          Allergies  Allergen Reactions  . Latex Hives  . Codeine Other (See Comments)    constipation     Consultations:  Urology   Procedures/Studies: US Scrotum  Result Date: 02/10/2016 CLINICAL DATA:  Left-sided scrotal pain EXAM: ULTRASOUND OF SCROTUM TECHNIQUE: Complete ultrasound examination of the testicles, epididymis, and other scrotal structures was performed. COMPARISON:  None. FINDINGS: Right testicle Measurements: 5.1 x 1.9 x 2.8 cm. No mass or microlithiasis visualized. Left testicle Measurements: 4.8 x 2.9 x 3.5 cm. No mass or microlithiasis visualized. Right epididymis: Right epididymal cyst measuring 2.1 x 1.2 x 1.8 cm. Left epididymis:  Slightly enlarged and hypervascular. Hydrocele: Small right simple appearing hydrocele. Small left-sided hydrocele containing septation but no solid tissue. Varicocele:  Minimal right varicocele IMPRESSION: 1. No sonographic evidence for testicular torsion. Slightly enlarged and hyperemic left epididymis could relate to epididymitis. 2. Right epididymal cyst. 3. Trace right hydrocele.  Small septated left hydrocele. 4. Minimal right varicocele. Electronically Signed   By: Jasmine Pang M.D.   On: 02/10/2016 18:31   [place] Art/ven Flow Abd Pelv Doppler  Result Date: 02/10/2016 CLINICAL DATA:  Left-sided scrotal pain EXAM: ULTRASOUND OF SCROTUM TECHNIQUE: Complete ultrasound examination of the testicles, epididymis, and other scrotal structures  was performed. COMPARISON:  None. FINDINGS: Right testicle Measurements: 5.1 x 1.9 x 2.8 cm. No mass or microlithiasis visualized. Left testicle Measurements: 4.8 x 2.9 x 3.5 cm. No mass or microlithiasis visualized. Right epididymis: Right epididymal cyst measuring 2.1 x 1.2 x 1.8 cm. Left epididymis:  Slightly enlarged and hypervascular. Hydrocele: Small right simple appearing hydrocele. Small left-sided hydrocele containing septation but no solid tissue. Varicocele:  Minimal right varicocele IMPRESSION: 1. No sonographic evidence for testicular torsion. Slightly enlarged and hyperemic left epididymis could relate  to epididymitis. 2. Right epididymal cyst. 3. Trace right hydrocele.  Small septated left hydrocele. 4. Minimal right varicocele. Electronically Signed   By: Jasmine Pang M.D.   On: 02/10/2016 18:31       Subjective: Patient was seen and examined at bedside. Patient reported he is feeling better. Redness and pain significantly improved in his scrotum. Denies fever, chills, nausea, vomiting, chest pain, shortness of breath, abdominal pain. Denies dysuria, urgency or frequency. No diarrhea or constipation.   Discharge Exam: Vitals:   02/13/16 1400 02/14/16 0535  BP: 137/74 (!) 158/68  Pulse: 74 71  Resp: 18   Temp: 98.5 F (36.9 C) 98.1 F (36.7 C)   Vitals:   02/12/16 2041 02/13/16 0500 02/13/16 1400 02/14/16 0535  BP: 138/63 (!) 153/73 137/74 (!) 158/68  Pulse: 60 67 74 71  Resp: 18  18   Temp: 98.9 F (37.2 C) 98.8 F (37.1 C) 98.5 F (36.9 C) 98.1 F (36.7 C)  TempSrc: Oral Oral Oral Oral  SpO2: 96% 95%  96%  Weight:      Height:        General: Pt is alert, awake, not in acute distress Cardiovascular: RRR, S1/S2 +, no rubs, no gallops Respiratory: CTA bilaterally, no wheezing, no rhonchi Abdominal: Soft, NT, ND, bowel sounds + Extremities: no edema, no cyanosis Scrotum: improvement in scrotal swelling and erythema.    The results of significant diagnostics from this hospitalization (including imaging, microbiology, ancillary and laboratory) are listed below for reference.     Microbiology: Recent Results (from the past 240 hour(s))  Urine culture     Status: Abnormal   Collection Time: 02/10/16 12:03 AM  Result Value Ref Range Status   Specimen Description URINE, CLEAN CATCH  Final   Special Requests NONE  Final   Culture (A)  Final    60,000 COLONIES/mL GROUP B STREP(S.AGALACTIAE)ISOLATED TESTING AGAINST S. AGALACTIAE NOT ROUTINELY PERFORMED DUE TO PREDICTABILITY OF AMP/PEN/VAN SUSCEPTIBILITY. Performed at [facility]    Report Status 02/12/2016  FINAL  Final     Labs: BNP (last 3 results) No results for input(s): BNP in the last 8760 hours. Basic Metabolic Panel:  Recent Labs Lab 02/10/16 2223 02/11/16 0221 02/12/16 0519  NA 134* 134* 139  K 3.8 3.7 4.6  CL 98* 101 108  CO2 24 26 28   GLUCOSE 100* 192* 63*  BUN 21* 25* 14  CREATININE 1.35* 1.19 0.97  CALCIUM 9.3 8.3* 8.7*   Liver Function Tests: No results for input(s): AST, ALT, ALKPHOS, BILITOT, PROT, ALBUMIN in the last 168 hours. No results for input(s): LIPASE, AMYLASE in the last 168 hours. No results for input(s): AMMONIA in the last 168 hours. CBC:  Recent Labs Lab 02/10/16 2223 02/11/16 0221 02/12/16 0519 02/13/16 0512 02/14/16 0521  WBC 24.0* 22.3* 20.5* 12.2* 10.3  NEUTROABS 20.1*  --  16.2* 8.3* 6.4  HGB 14.6 12.1* 11.8* 11.4* 11.7*  HCT 42.8 36.2* 35.2* 34.2* 35.3*  MCV 85.9 86.2 84.6 84.2 85.9  PLT 304 250 247 283 316   Cardiac Enzymes: No results for input(s): CKTOTAL, CKMB, CKMBINDEX, TROPONINI in the last 168 hours. BNP: Invalid input(s): POCBNP CBG:  Recent Labs Lab 02/13/16 0729 02/13/16 1229 02/13/16 1650 02/13/16 2109 02/14/16 0745  GLUCAP 79 158* 179* 185* 77   D-Dimer No results for input(s): DDIMER in the last 72 hours. Hgb A1c No results for input(s): HGBA1C in the last 72 hours. Lipid Profile No results for input(s): CHOL, HDL, LDLCALC, TRIG, CHOLHDL, LDLDIRECT in the last 72 hours. Thyroid function studies No results for input(s): TSH, T4TOTAL, T3FREE, THYROIDAB in the last 72 hours.  Invalid input(s): FREET3 Anemia work up No results for input(s): VITAMINB12, FOLATE, FERRITIN, TIBC, IRON, RETICCTPCT in the last 72 hours. Urinalysis    Component Value Date/Time   COLORURINE AMBER (A) 02/11/2016 0003   APPEARANCEUR CLOUDY (A) 02/11/2016 0003   LABSPEC 1.027 02/11/2016 0003   PHURINE 5.5 02/11/2016 0003   GLUCOSEU NEGATIVE 02/11/2016 0003   HGBUR NEGATIVE 02/11/2016 0003   BILIRUBINUR SMALL (A) 02/11/2016  0003   KETONESUR NEGATIVE 02/11/2016 0003   PROTEINUR NEGATIVE 02/11/2016 0003   UROBILINOGEN 0.2 01/08/2015 1700   NITRITE NEGATIVE 02/11/2016 0003   LEUKOCYTESUR MODERATE (A) 02/11/2016 0003   Sepsis Labs Invalid input(s): PROCALCITONIN,  WBC,  LACTICIDVEN Microbiology Recent Results (from the past 240 hour(s))  Urine culture     Status: Abnormal   Collection Time: 02/10/16 12:03 AM  Result Value Ref Range Status   Specimen Description URINE, CLEAN CATCH  Final   Special Requests NONE  Final   Culture (A)  Final    60,000 COLONIES/mL GROUP B STREP(S.AGALACTIAE)ISOLATED TESTING AGAINST S. AGALACTIAE NOT ROUTINELY PERFORMED DUE TO PREDICTABILITY OF AMP/PEN/VAN SUSCEPTIBILITY. Performed at [facility]    Report Status 02/12/2016 FINAL  Final     Time coordinating discharge: Over 30 minutes  SIGNED:   Maxie Barb, MD  [facility] 02/14/2016, 10:50 AM Pager   If 7PM-7AM, please contact night-coverage [email or web address] Password [email or web address]

## 2016-02-14 NOTE — Progress Notes (Signed)
Date: February 14, 2016 Discharge orders checked for needs. No case management needs present at time of discharge. Marcelle Smilinghonda Harlee Eckroth, RN, BSN, [place]   [phone]

## 2016-02-14 NOTE — Consult Note (Signed)
Urology Consult Note   Requesting Attending Physician:  Todd Barbron Prasad Bhandari, MD Service Providing Consult: Urology Consulting Attending: Patsi Searsannenbaum, MD  Assessment:  Patient is a 65 y.o. male with history of BPH s/p TURP admitted with lactate 2.1, WBC 24, left scrotal pain and edema with ultrasound suggestive of left sided epididymitis, with left scrotal cellulitis apparent on physical exam as well. There is scrotal edema noted on ultrasound as well.  Interval: AFVSS. WBC 10. Physical exam continues to improve. On vanc.  AFVSS. 4 voids. WBC 12 from 20. Urine culture showing Group B Strep. On vanc/levo. Physical exam improving.  Recommendations: 1. Defer to medicine on antibiotics. Would seem reasonable to attempt transition to PO antibiotics today based on urine culture (augmentin?) 2. Ice PRN to scrotum 3. Elevate scrotum using towel while lying flat and use mesh underwear/jock strap for scrotal support. He should continue to do this until swelling and pain has entirely resolved (including after discharge) 4. NSAIDs for pain control 5. Follow up with Dr. Patsi Krause in 2-4 weeks for visit & repeat physical exam +/- scrotal u/s  Thank you for this consult. Please contact the urology consult pager with any further questions/concerns. We will sign off.  Todd MulletMatthew Zahriyah Joo, MD Urology Surgical Resident   Subjective: Eating/drinking well. Pain improving. Swelling decreasing. + OOB. + flatus/BM. No n/v. No fevers.  Objective   Vital signs in last 24 hours: BP (!) 158/68 (BP Location: Left Arm)   Pulse 71   Temp 98.1 F (36.7 C) (Oral)   Resp 18   Ht 5\' 8"  (1.727 m)   Wt 220 lb (99.8 kg)   SpO2 96%   BMI 33.45 kg/m   Intake/Output last 3 shifts: I/O last 3 completed shifts: In: 720 [P.O.:120; IV Piggyback:600] Out: -   Physical Exam General: NAD, A&O, resting, appropriate HEENT: [place]/AT, EOMI, MMM Pulmonary: Normal work of breathing on RA Cardiovascular: Regular rate & rhythm,  HDS, adequate peripheral perfusion Abdomen: soft, NTTP, nondistended, no suprapubic fullness or tenderness, protuberant Gu: uncircumcised male phallus, no penile lesions, no urethral meatus discharge or blood, erythematous, indurated and TTP scrotum L > R, cannot palpate left testicle/epididymis well due to induration, right testicle feels normal with spermatocele, no areas of fluctuance, necrotic tissue, crepitus or draining fluid; erythema continues to improve, induration about the left hemiscrotum DRE: not performed Extremities: warm and well perfused, no edema  Most Recent Labs: Lab Results  Component Value Date   WBC 10.3 02/14/2016   HGB 11.7 (L) 02/14/2016   HCT 35.3 (L) 02/14/2016   PLT 316 02/14/2016    Lab Results  Component Value Date   NA 139 02/12/2016   K 4.6 02/12/2016   CL 108 02/12/2016   CO2 28 02/12/2016   BUN 14 02/12/2016   CREATININE 0.97 02/12/2016   CALCIUM 8.7 (L) 02/12/2016   MG 2.0 01/09/2015    Lab Results  Component Value Date   ALKPHOS 77 01/08/2015   BILITOT 1.0 01/08/2015   BILIDIR 0.2 06/09/2010   PROT 6.0 (L) 01/08/2015   ALBUMIN 3.3 (L) 01/08/2015   ALT 23 01/08/2015   AST 18 01/08/2015    Lab Results  Component Value Date   INR 1.02 11/21/2009     Urine Culture: Group B Strep 02/10/16   IMAGING: No results found.

## 2016-02-14 NOTE — Progress Notes (Signed)
Patient given discharge instructions and prescriptions. No questions or concerns at this time. Patient walked to the car with his son.

## 2016-02-14 NOTE — Progress Notes (Signed)
Urology Progress Note  : Consult ending note:  Subjective:   Patient is a 65 y.o. male with history of BPH s/p TURP admitted with lactate 2.1, WBC 24, left scrotal pain and edema with ultrasound suggestive of left sided epididymitis, with left scrotal cellulitis apparent on physical exam as well. There is scrotal edema noted on ultrasound as well.  Interval: AFVSS. 4 voids. WBC 12 from 20. Urine culture showing Group B Strep. On vanc/levo. Physical exam: No more erythema. Scrotal skin normal. Testis firm, non-tender.       No acute urologic events overnight. Ambulation:   positive Flatus:    positive Bowel movement  positive  Pain: some relief  Objective:  Blood pressure (!) 158/68, pulse 71, temperature 98.1 F (36.7 C), temperature source Oral, resp. rate 18, height 5\' 8"  (1.727 m), weight 99.8 kg (220 lb), SpO2 96 %.  Physical Exam:  General:  No acute distress, awake Extremities: extremities normal, atraumatic, no cyanosis or edema Genitourinary:  See HPI Foley:  none    I/O last 3 completed shifts: In: 720 [P.O.:120; IV Piggyback:600] Out: -   Recent Labs     02/13/16  0512  02/14/16  0521  HGB  11.4*  11.7*  WBC  12.2*  10.3  PLT  283  316    Recent Labs     02/12/16  0519  NA  139  K  4.6  CL  108  CO2  28  BUN  14  CREATININE  0.97  CALCIUM  8.7*  GFRNONAA  >60  GFRAA  >60     No results for input(s): INR, APTT in the last 72 hours.  Invalid input(s): PT   Invalid input(s): ABG  Assessment: resolving epididymitis/orchitis. Pt will have testis firmness for 3 weeks. Pain and erythema resolved. No further Urology consultation necessary. Please call if needed.

## 2016-02-16 LAB — CULTURE, BLOOD (ROUTINE X 2)
CULTURE: NO GROWTH
CULTURE: NO GROWTH

## 2016-11-21 DIAGNOSIS — Z87891 Personal history of nicotine dependence: Secondary | ICD-10-CM | POA: Insufficient documentation

## 2018-09-28 IMAGING — US US SCROTUM
1 series · 14 of 25 positions shown · non-contrast
Comparison: None.

CLINICAL DATA: Left-sided scrotal pain

EXAM:
ULTRASOUND OF SCROTUM
TECHNIQUE: Complete ultrasound examination of the testicles, epididymis, and
other scrotal structures was performed.

[Series 1: us scrotum · 0.07mm/px · 14 of 101 slices shown]
[im 1/101]
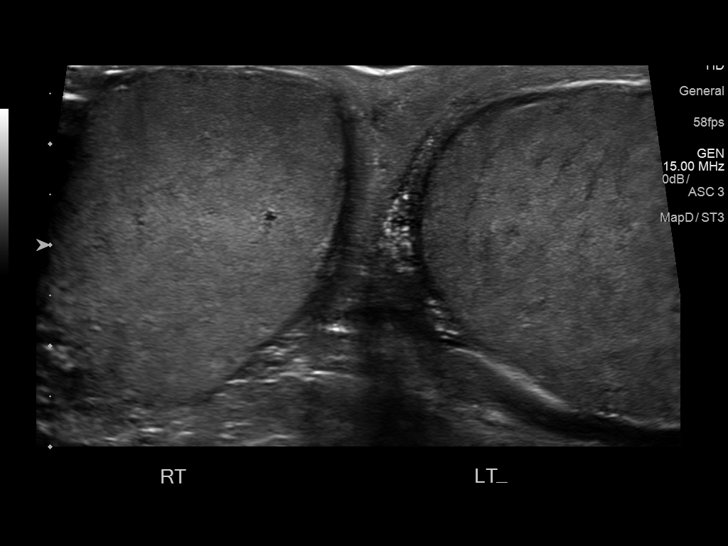
[im 9/101]
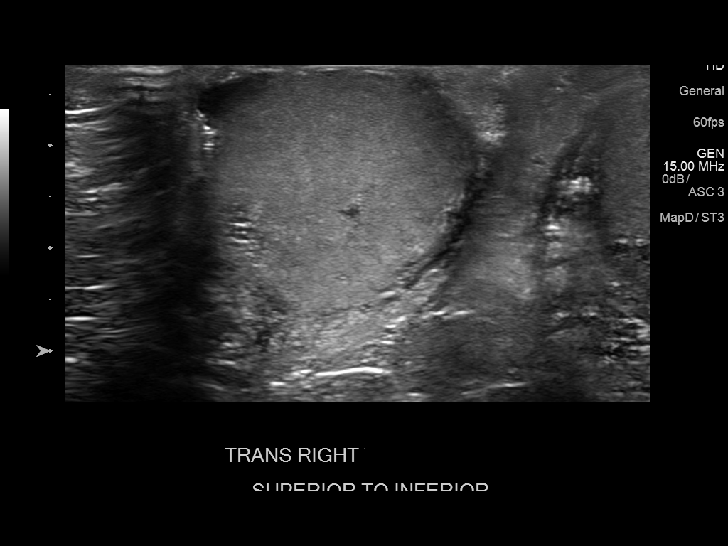
[im 17/101]
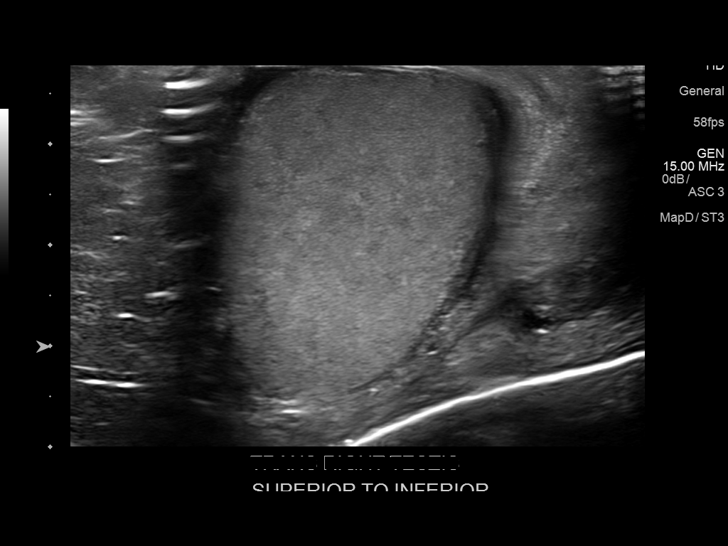
[im 26/101]
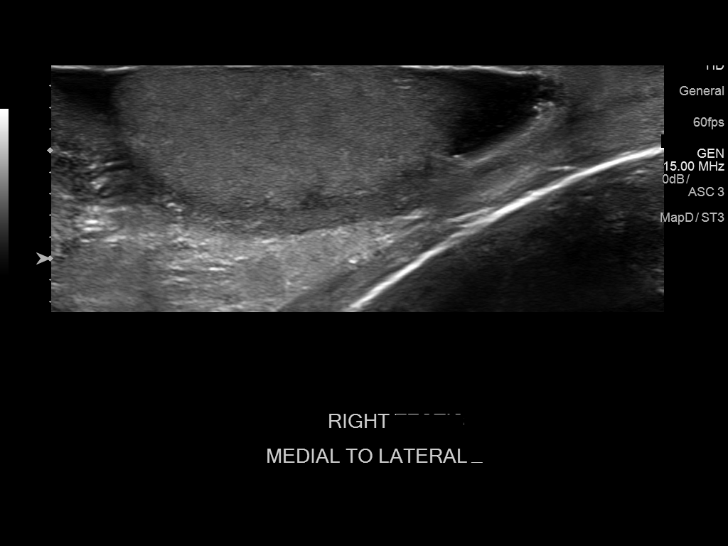
[im 34/101]
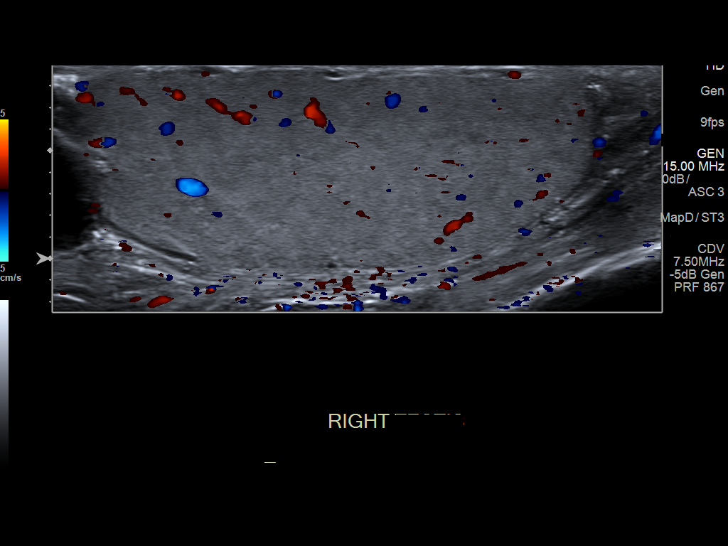
[im 38/101]
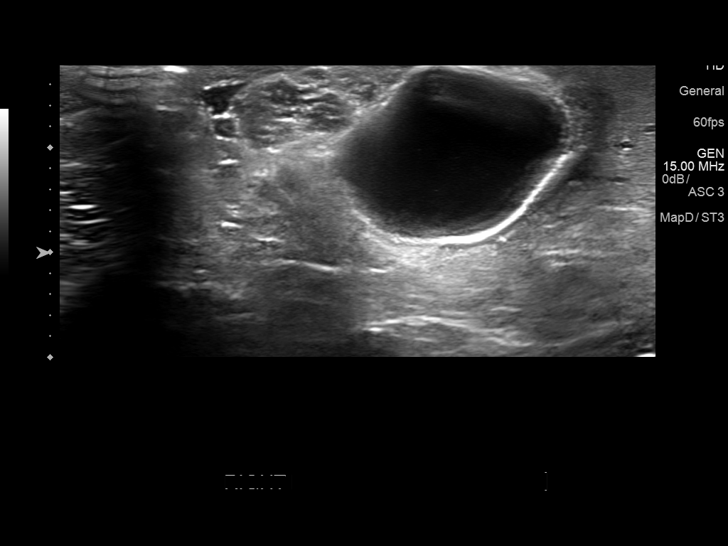
[im 46/101]
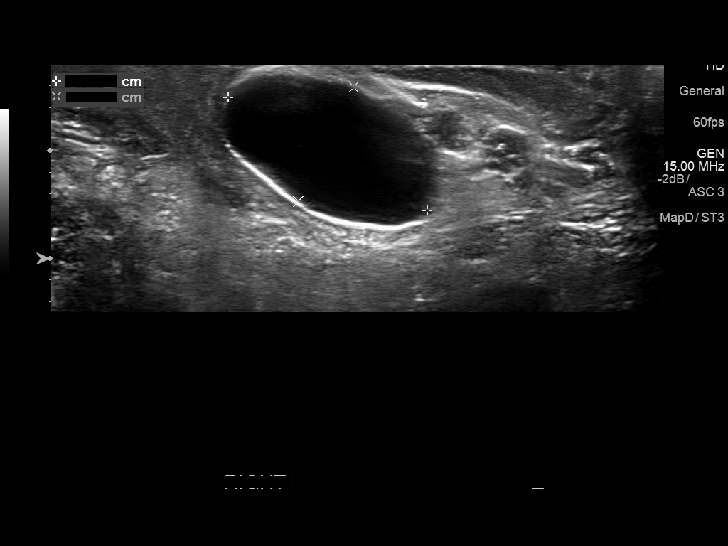
[im 55/101]
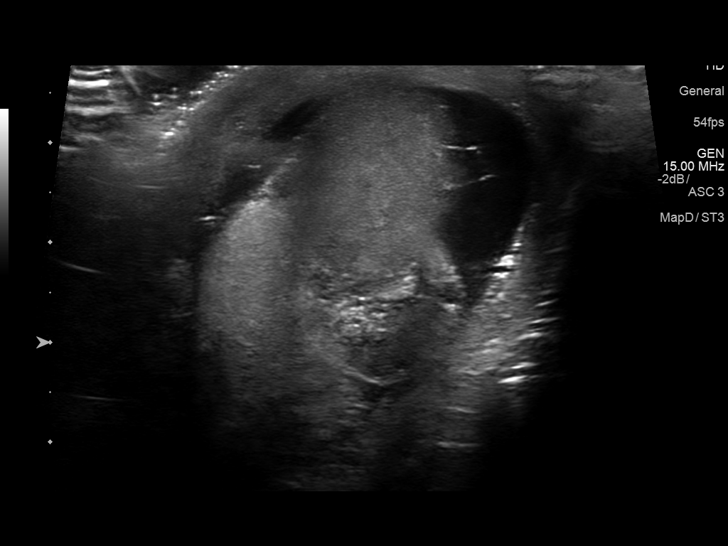
[im 63/101]
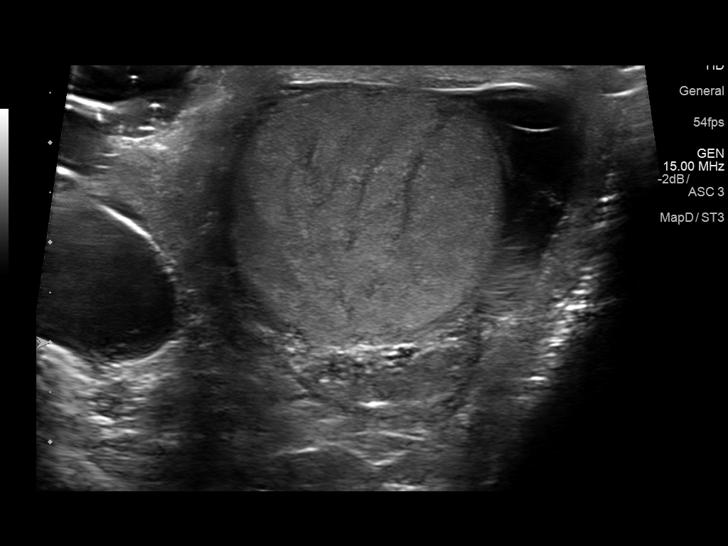
[im 67/101]
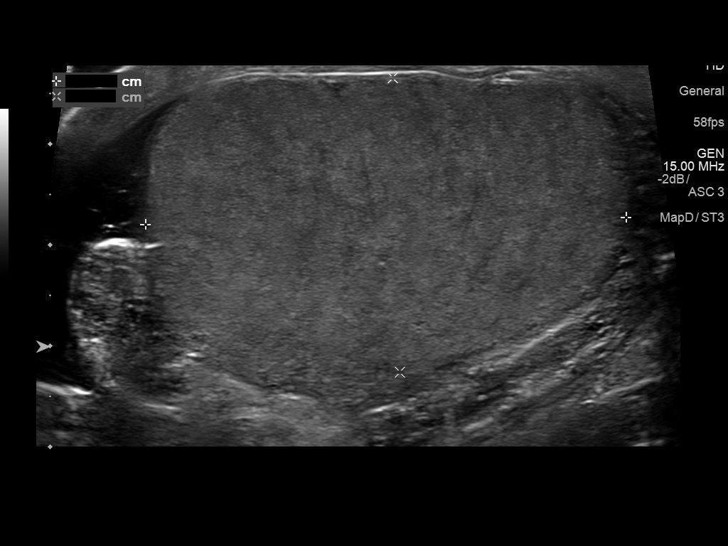
[im 76/101]
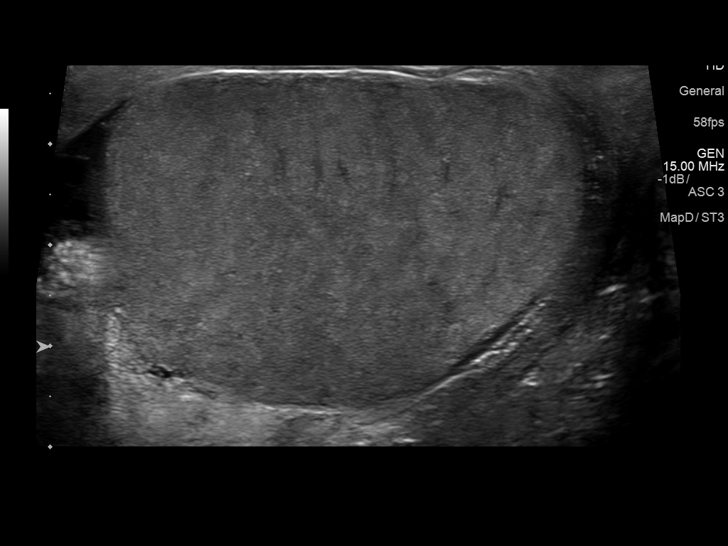
[im 84/101]
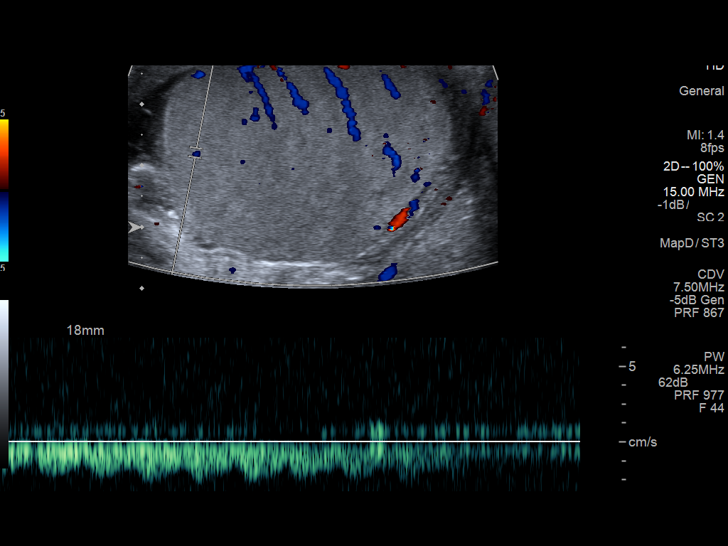
[im 92/101]
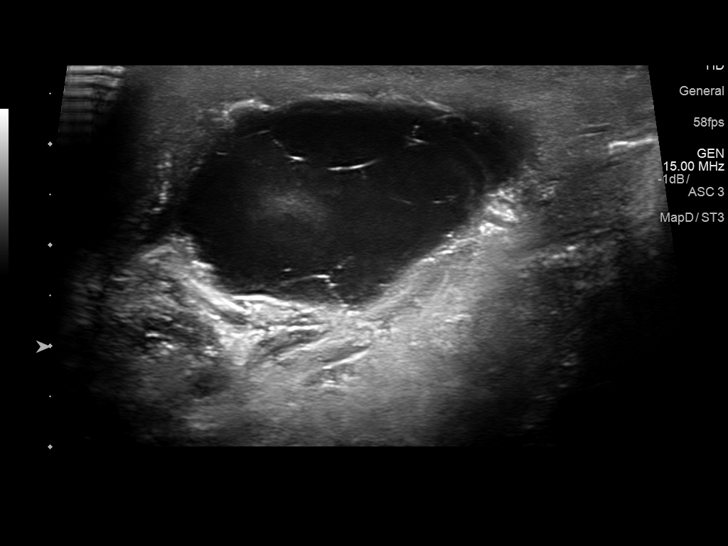
[im 101/101]
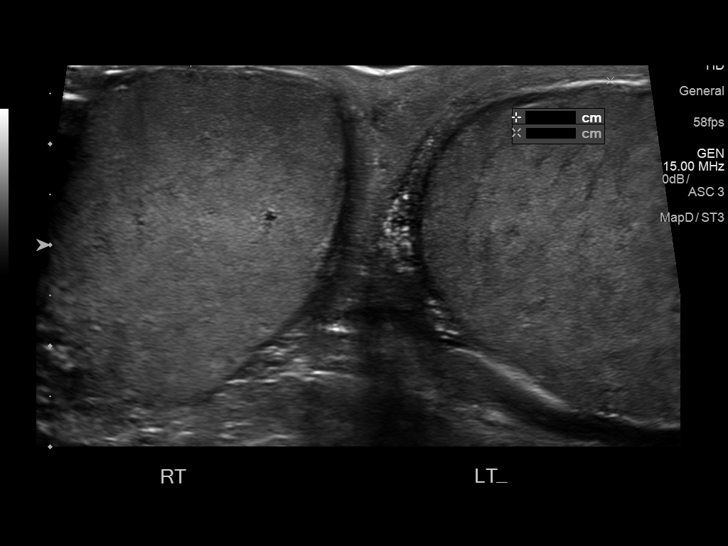

[14 of 25 positions shown; findings below may reference images not displayed]

FINDINGS: Right testicle

Measurements: 5.1 x 1.9 x 2.8 cm. No mass or microlithiasis
visualized.

Left testicle

Measurements: 4.8 x 2.9 x 3.5 cm. No mass or microlithiasis
visualized.

Right epididymis: Right epididymal cyst measuring 2.1 x 1.2 x
cm.

Left epididymis:  Slightly enlarged and hypervascular.

Hydrocele: Small right simple appearing hydrocele. Small left-sided
hydrocele containing septation but no solid tissue.

Varicocele:  Minimal right varicocele
IMPRESSION: 1. No sonographic evidence for testicular torsion. Slightly enlarged
and hyperemic left epididymis could relate to epididymitis.
2. Right epididymal cyst.
3. Trace right hydrocele.  Small septated left hydrocele.
4. Minimal right varicocele.

## 2020-03-03 ENCOUNTER — Other Ambulatory Visit: Payer: Self-pay | Admitting: Nurse Practitioner

## 2020-03-03 DIAGNOSIS — F17210 Nicotine dependence, cigarettes, uncomplicated: Secondary | ICD-10-CM

## 2020-03-04 ENCOUNTER — Other Ambulatory Visit: Payer: Self-pay | Admitting: Nurse Practitioner

## 2020-03-04 DIAGNOSIS — F17210 Nicotine dependence, cigarettes, uncomplicated: Secondary | ICD-10-CM

## 2020-03-18 ENCOUNTER — Emergency Department (HOSPITAL_COMMUNITY)
Admission: EM | Admit: 2020-03-18 | Discharge: 2020-03-18 | Disposition: A | Discharge status: Home / Self Care | Payer: Medicare HMO | Attending: Emergency Medicine | Admitting: Emergency Medicine

## 2020-03-18 ENCOUNTER — Encounter (HOSPITAL_COMMUNITY): Payer: Self-pay | Admitting: Emergency Medicine

## 2020-03-18 ENCOUNTER — Other Ambulatory Visit: Payer: Self-pay

## 2020-03-18 DIAGNOSIS — Z7984 Long term (current) use of oral hypoglycemic drugs: Secondary | ICD-10-CM | POA: Diagnosis not present

## 2020-03-18 DIAGNOSIS — Z87891 Personal history of nicotine dependence: Secondary | ICD-10-CM | POA: Insufficient documentation

## 2020-03-18 DIAGNOSIS — Z794 Long term (current) use of insulin: Secondary | ICD-10-CM | POA: Insufficient documentation

## 2020-03-18 DIAGNOSIS — I10 Essential (primary) hypertension: Secondary | ICD-10-CM | POA: Insufficient documentation

## 2020-03-18 DIAGNOSIS — E119 Type 2 diabetes mellitus without complications: Secondary | ICD-10-CM | POA: Diagnosis not present

## 2020-03-18 DIAGNOSIS — Z79899 Other long term (current) drug therapy: Secondary | ICD-10-CM | POA: Diagnosis not present

## 2020-03-18 DIAGNOSIS — R2242 Localized swelling, mass and lump, left lower limb: Secondary | ICD-10-CM | POA: Diagnosis not present

## 2020-03-18 DIAGNOSIS — M7989 Other specified soft tissue disorders: Secondary | ICD-10-CM

## 2020-03-18 DIAGNOSIS — Z91040 Latex allergy status: Secondary | ICD-10-CM | POA: Diagnosis not present

## 2020-03-18 MED ORDER — ENOXAPARIN SODIUM 100 MG/ML ~~LOC~~ SOLN
1.0000 mg/kg | Freq: Once | SUBCUTANEOUS | Status: AC
  Administered 2020-03-18: 100 mg via SUBCUTANEOUS
  Filled 2020-03-18: qty 1

## 2020-03-18 NOTE — ED Triage Notes (Signed)
Patient here from home reporting left leg pain from knee down after he tripped over cement block last Friday "but did not fall". States that x-rays were neg at urgent care, but was sent for CT scan.

## 2020-03-18 NOTE — Discharge Instructions (Addendum)
Please come back tomorrow for your ultrasound.  If you develop any chest pain, any difficulty in breathing or other new concerning symptom, return to ER for reassessment.  Would recommend following up with your primary doctor on Monday regardless of the results. IF there is a blood clot, you will need to be started on a blood thinner.

## 2020-03-19 ENCOUNTER — Emergency Department (HOSPITAL_BASED_OUTPATIENT_CLINIC_OR_DEPARTMENT_OTHER)
Admission: RE | Admit: 2020-03-19 | Discharge: 2020-03-19 | Disposition: A | Discharge status: Home / Self Care | Payer: Medicare HMO | Source: Ambulatory Visit | Attending: Emergency Medicine | Admitting: Emergency Medicine

## 2020-03-19 ENCOUNTER — Encounter (HOSPITAL_COMMUNITY): Payer: Medicare HMO

## 2020-03-19 DIAGNOSIS — M7989 Other specified soft tissue disorders: Secondary | ICD-10-CM

## 2020-03-19 NOTE — Progress Notes (Signed)
Lower extremity venous has been completed.   Preliminary results in CV Proc.   Blanch Media 03/19/2020 10:32 AM

## 2020-03-19 NOTE — ED Provider Notes (Signed)
[facility]-EMERGENCY DEPT Provider Note   CSN: [id number] Arrival date & time: 03/18/20  1936     History Chief Complaint  Patient presents with  . Leg Pain    Todd Krause is a 69 y.o. male.  Presents to ER with concern for left leg pain.  States that he may have had minor injury on Friday but did not think much of it at the time.  Then noted some pain in his knee and lower leg over the subsequent days.  Worsening throughout the day today, also noted some swelling in his left leg.  Pain is worse with movement, alleviated with rest.  Able to ambulate.  He denies prior history of DVT/PE.  Denies any long car rides, plane rides, recent surgeries.  Has not medical history for type 2 diabetes, hypertension, hyperlipidemia.  No associated chest pain or difficulty breathing.  Patient reports that he went to urgent care where they completed x-rays of his knee, lower leg and foot and was told they were all normal.  Sent to ER to rule out blood clot.  Per care everywhere, x-ray knee, tib-fib, foot negative for acute fracture or dislocation.  HPI     Past Medical History:  Diagnosis Date  . Arthritis    hands, knees  . BPH (benign prostatic hypertrophy)   . Chronic bronchitis (HCC)   . ED (erectile dysfunction)   . Elevated PSA   . GERD (gastroesophageal reflux disease)   . History of colon polyps   . Hyperlipidemia   . Hypertension   . Lower urinary tract symptoms (LUTS)   . OSA (obstructive sleep apnea)    mild to moderate osa per study 02-18-2004--  no cpap  . Prostate nodule   . Type 2 diabetes mellitus [facility])     Patient Active Problem List   Diagnosis Date Noted  . Sepsis, unspecified organism (HCC) 02/11/2016  . Elevated serum creatinine 02/11/2016  . Epididymitis 02/11/2016  . Sepsis (HCC) 02/11/2016  . Acute kidney injury (HCC)   . Benign prostatic hyperplasia 05/09/2015  . Type 2 diabetes mellitus without complication, with  long-term current use of insulin (HCC) 01/08/2015  . Hyponatremia 01/08/2015  . HYPERCHOLESTEROLEMIA 02/03/2009  . Essential hypertension 08/26/2007  . 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 ERECTILE DYSFUNCTION 01/24/2007  . OSTEOARTHRITIS 01/24/2007    Past Surgical History:  Procedure Laterality Date  . 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 COLONOSCOPY  last one -- april 2016  . EYE SURGERY Bilateral    iol lens for cataracts  . melanoma removed from back  15 yrs ago  . PROSTATE BIOPSY N/A 10/01/2014   Procedure: BIOPSY TRANSRECTAL ULTRASONIC PROSTATE (TUBP);  Surgeon: Jethro Bolus, MD;  Location: [facility];  Service: Urology;  Laterality: N/A;  . TRANSTHORACIC ECHOCARDIOGRAM  11-20-2009   grade I diastolic dysfunction/  ef 60-65%/  mild AR, MR, and TR/  trivial PR  . TRANSURETHRAL RESECTION OF PROSTATE N/A 05/09/2015   Procedure: CYSTOSCOPY TRANSURETHRAL RESECTION OF THE PROSTATE (TURP);  Surgeon: Jethro Bolus, MD;  Location: [facility];  Service: Urology;  Laterality: N/A;       Family History  Problem Relation Age of Onset  . Diabetes Mother   . Cancer Mother   . Diabetes Father   . Heart attack Father   . Colon cancer  Neg Hx   . Colon polyps Neg Hx   . Kidney disease Neg Hx   . Gallbladder disease Neg Hx   . Esophageal cancer Neg Hx     Social History   Tobacco Use  . Smoking status: Former Smoker    Packs/day: 2.00    Years: 30.00    Pack years: 60.00    Types: Cigarettes    Quit date: 09/23/2006    Years since quitting: 13.4  . Smokeless tobacco: Never Used  Substance Use Topics  . Alcohol use: No    Alcohol/week: 0.0 standard drinks  . Drug use: No    Home Medications Prior to Admission medications   Medication Sig Start Date End Date Taking? Authorizing Provider  acetaminophen (TYLENOL) 500 MG tablet Take 1,000 mg by mouth  every 6 (six) hours as needed for moderate pain.     [provider]  albuterol (PROVENTIL HFA;VENTOLIN HFA) 108 (90 BASE) MCG/ACT inhaler Inhale 2 puffs into the lungs every 4 (four) hours as needed for wheezing or shortness of breath.  03/01/14   [provider]  albuterol (PROVENTIL) (2.5 MG/3ML) 0.083% nebulizer solution Take 2.5 mg by nebulization every 6 (six) hours as needed for wheezing or shortness of breath.     [provider]  Aspirin-Acetaminophen-Caffeine (GOODY HEADACHE PO) Take 1 each by mouth daily as needed (pain).    [provider]  Cholecalciferol (VITAMIN D3) 2000 UNITS TABS Take 1 capsule by mouth every evening.     [provider]  fenofibrate 54 MG tablet TAKE 1 TABLET EVERY DAY Patient taking differently: Take 54 mg by mouth every evening 07/10/13   Romero BellingEllison, Sean, MD  finasteride (PROSCAR) 5 MG tablet Take 1 tablet (5 mg total) by mouth daily. Patient taking differently: Take 5 mg by mouth every evening.  01/11/15   Leatha GildingGherghe, Costin M, MD  Insulin Glargine (LANTUS) 100 UNIT/ML Solostar Pen Inject 120 Units into the skin daily at 10 pm.  04/19/14   [provider]  Liraglutide 18 MG/3ML SOPN Inject 1.8 mg into the skin every morning.    [provider]  lisinopril-hydrochlorothiazide (PRINZIDE,ZESTORETIC) 20-25 MG per tablet Take 1 tablet by mouth every morning.  03/01/14   [provider]  metFORMIN (GLUCOPHAGE) 1000 MG tablet Take 1,000 mg by mouth 2 (two) times daily. 08/03/14   [provider]  metoprolol succinate (TOPROL-XL) 25 MG 24 hr tablet Take 1 tablet (25 mg total) by mouth daily. Patient taking differently: Take 25 mg by mouth every evening.  05/13/13   Romero BellingEllison, Sean, MD  Multiple Vitamin (MULTIVITAMIN) tablet Take 1 tablet by mouth every evening.     [provider]  naproxen sodium (ANAPROX) 220 MG tablet Take 440 mg by mouth every 12 (twelve) hours as needed (pain).    [provider]  omeprazole (PRILOSEC) 20 MG capsule Take 1 capsule (20 mg total) by mouth daily. Patient taking differently: Take 20 mg by mouth 2 (two) times daily.  05/13/13   Romero BellingEllison, Sean, MD  pravastatin (PRAVACHOL) 40 MG tablet Take 40 mg by mouth at bedtime.      [provider]  Red Yeast Rice 600 MG CAPS Take 600 mg by mouth every evening.     [provider]  vitamin B-12 (CYANOCOBALAMIN) 1000 MCG tablet Take 1,000 mcg by mouth every evening.     [provider]    Allergies    Latex and Codeine  Review of Systems  Review of Systems  Constitutional: Negative for chills and fever.  HENT: Negative for ear pain and sore throat.   Eyes: Negative for pain and visual disturbance.  Respiratory: Negative for cough and shortness of breath.   Cardiovascular: Negative for chest pain and palpitations.  Gastrointestinal: Negative for abdominal pain and vomiting.  Genitourinary: Negative for dysuria and hematuria.  Musculoskeletal: Positive for myalgias. Negative for arthralgias and back pain.  Skin: Negative for color change and rash.  Neurological: Negative for seizures and syncope.  All other systems reviewed and are negative.   Physical Exam Updated Vital Signs BP (!) 163/72   Pulse (!) 103   Temp 99 F (37.2 C) (Oral)   Resp 16   Ht 5\' 8"  (1.727 m)   Wt 99.8 kg   SpO2 93%   BMI 33.45 kg/m   Physical Exam Vitals and nursing note reviewed.  Constitutional:      Appearance: He is well-developed.  HENT:     Head: Normocephalic and atraumatic.  Eyes:     Conjunctiva/sclera: Conjunctivae normal.  Cardiovascular:     Rate and Rhythm: Normal rate and regular rhythm.     Heart sounds: No murmur heard.   Pulmonary:     Effort: Pulmonary effort is normal. No respiratory distress.     Breath sounds: Normal breath sounds.  Abdominal:     Palpations: Abdomen is soft.     Tenderness: There is no abdominal tenderness.  Musculoskeletal:      Cervical back: Neck supple.     Comments: Left lower extremity: There is mild swelling to his lower leg, nonpitting, some tenderness over knee, lower leg, ankle joint range of motion throughout, normal DP/PT pulses, normal sensation, normal strength  Skin:    General: Skin is warm and dry.  Neurological:     Mental Status: He is alert.     ED Results / Procedures / Treatments   Labs (all labs ordered are listed, but only abnormal results are displayed) Labs Reviewed - No data to display  EKG None  Radiology No results found.  Procedures Procedures (including critical care time)  Medications Ordered in ED Medications  enoxaparin (LOVENOX) injection 100 mg (100 mg Subcutaneous Given 03/18/20 2201)    ED Course  I have reviewed the triage vital signs and the nursing notes.  Pertinent labs & imaging results that were available during my care of the patient were reviewed by me and considered in my medical decision making (see chart for details).    MDM Rules/Calculators/A&P                          69 year old male presented to ER with concern for left knee, lower leg pain. No systemic symptoms. X-rays at urgent care were negative.  Sent to rule out DVT.  On exam patient was noted to have a mild amount of swelling in his left lower leg, neurovascularly intact, normal joint ROM.  Believe patient would benefit from getting DVT study.  Not available tonight.  Will provide dose of Lovenox and have patient return in the morning for formal DVT study.  If negative, recommended patient have follow-up with his primary doctor. If positive, would need appropriate treatment.     After the discussed management above, the patient was determined to be safe for discharge.  The patient was in agreement with this plan and all questions regarding their care were answered.  ED return precautions were discussed and the  patient will return to the ED with any significant worsening of  condition.    Final Clinical Impression(s) / ED Diagnoses Final diagnoses:  Leg swelling    Rx / DC Orders ED Discharge Orders         Ordered    VAS [place] LOWER EXTREMITY VENOUS (DVT)        03/18/20 2133           Milagros Loll, MD 03/19/20 1115

## 2020-03-21 ENCOUNTER — Ambulatory Visit: Payer: Medicare HMO

## 2020-10-11 ENCOUNTER — Telehealth: Payer: Self-pay | Admitting: *Deleted

## 2020-10-11 NOTE — Telephone Encounter (Signed)
Patients notes on file....referral by [facility] [phone]

## 2020-10-11 NOTE — Telephone Encounter (Signed)
Have Patient's notes on  file....referral by Paul Half. Dareen Piano, NP [phone]

## 2020-12-04 NOTE — Progress Notes (Signed)
Cardiology Office Note:    Date:  12/07/2020   ID:  Todd Krause, DOB 10/07/1950, MRN [id number]  PCP:  Elizabeth Palau, FNP  Cardiologist:  None  Electrophysiologist:  None   Referring MD: Elizabeth Palau, FNP   Chief Complaint  Patient presents with   Leg Pain     History of Present Illness:    Todd Krause is a 70 y.o. male with a hx of hypertension, hyperlipidemia, OSA, tobacco use T2DM  who is referred by Elizabeth Palau, NP for evaluation of hypertension and leg pain.  He reports that he has been having pain and swelling in his left leg.  Reports symptoms developed last year after he had a fall.  He underwent x-ray which showed no fracture and underwent lower extremity duplex which showed no DVT.  Currently reports he gets pain in the back of his leg when he walks, resolves with rest.  He denies any chest pain, dyspnea, lightheadedness, syncope, or palpitations.  Reports he does not exercise due to his leg pain.  He smokes 1.5 packs/day.  Family history includes father had MI in 63s, thinks his son has A. fib.  Most recent labs showed creatinine 0.8, A1c 7.2% LDL 79.   Past Medical History:  Diagnosis Date   Arthritis    hands, knees   BPH (benign prostatic hypertrophy)    Chronic bronchitis (HCC)    ED (erectile dysfunction)    Elevated PSA    GERD (gastroesophageal reflux disease)    History of colon polyps    Hyperlipidemia    Hypertension    Lower urinary tract symptoms (LUTS)    OSA (obstructive sleep apnea)    mild to moderate osa per study 02-18-2004--  no cpap   Prostate nodule    Type 2 diabetes mellitus [facility])     Past Surgical History:  Procedure Laterality Date   CARDIAC CATHETERIZATION  11-21-2009  dr Christiane Ha berry   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,  50% mCFX,  mild pulmonary HTN/  preserved LV , ef >60%   COLONOSCOPY  last one -- april 2016   EYE SURGERY Bilateral    iol lens for cataracts   melanoma removed from  back  15 yrs ago   PROSTATE BIOPSY N/A 10/01/2014   Procedure: BIOPSY TRANSRECTAL ULTRASONIC PROSTATE (TUBP);  Surgeon: Jethro Bolus, MD;  Location: [facility];  Service: Urology;  Laterality: N/A;   TRANSTHORACIC ECHOCARDIOGRAM  11-20-2009   grade I diastolic dysfunction/  ef 60-65%/  mild AR, MR, and TR/  trivial PR   TRANSURETHRAL RESECTION OF PROSTATE N/A 05/09/2015   Procedure: CYSTOSCOPY TRANSURETHRAL RESECTION OF THE PROSTATE (TURP);  Surgeon: Jethro Bolus, MD;  Location: [facility];  Service: Urology;  Laterality: N/A;    Current Medications: Current Meds  Medication Sig   acetaminophen (TYLENOL) 500 MG tablet Take 1,000 mg by mouth every 6 (six) hours as needed for moderate pain.    albuterol (PROVENTIL) (2.5 MG/3ML) 0.083% nebulizer solution Take 2.5 mg by nebulization every 6 (six) hours as needed for wheezing or shortness of breath.    amLODipine (NORVASC) 10 MG tablet Take 1 tablet by mouth daily.   Aspirin-Acetaminophen-Caffeine (GOODY HEADACHE PO) Take 1 each by mouth daily as needed (pain).   atorvastatin (LIPITOR) 20 MG tablet Take 20 mg by mouth 3 (three) times a week.   celecoxib (CELEBREX) 200 MG capsule Take 1 capsule by mouth 2 (two) times daily.   Cholecalciferol (VITAMIN  D3) 2000 UNITS TABS Take 1 capsule by mouth every evening.    finasteride (PROSCAR) 5 MG tablet Take 1 tablet (5 mg total) by mouth daily. (Patient taking differently: Take 5 mg by mouth every evening.)   finasteride (PROSCAR) 5 MG tablet Take 1 tablet by mouth daily.   Insulin Glargine (LANTUS) 100 UNIT/ML Solostar Pen Inject 120 Units into the skin daily at 10 pm.    Liraglutide 18 MG/3ML SOPN Inject 1.8 mg into the skin every morning.   metFORMIN (GLUCOPHAGE) 1000 MG tablet Take 1,000 mg by mouth 2 (two) times daily.   Multiple Vitamin (MULTIVITAMIN) tablet Take 1 tablet by mouth every evening.    naproxen sodium (ANAPROX) 220 MG tablet Take 440 mg by mouth every 12 (twelve)  hours as needed (pain).   omeprazole (PRILOSEC) 20 MG capsule Take 1 capsule (20 mg total) by mouth daily. (Patient taking differently: Take 20 mg by mouth 2 (two) times daily.)   Red Yeast Rice 600 MG CAPS Take 600 mg by mouth every evening.    tamsulosin (FLOMAX) 0.4 MG CAPS capsule Take 1 capsule by mouth 2 (two) times daily.   valsartan-hydrochlorothiazide (DIOVAN-HCT) 320-25 MG tablet Take 1 tablet by mouth daily.   vitamin B-12 (CYANOCOBALAMIN) 1000 MCG tablet Take 1,000 mcg by mouth every evening.    [DISCONTINUED] albuterol (PROVENTIL HFA;VENTOLIN HFA) 108 (90 BASE) MCG/ACT inhaler Inhale 2 puffs into the lungs every 4 (four) hours as needed for wheezing or shortness of breath.    [DISCONTINUED] carvedilol (COREG) 12.5 MG tablet Take 1 tablet (12.5 mg total) by mouth 2 (two) times daily.   [DISCONTINUED] metoprolol succinate (TOPROL-XL) 25 MG 24 hr tablet Take 1 tablet (25 mg total) by mouth daily. 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[employer])  Tobacco Use   Smoking status: Former    Packs/day: 2.00    Years: 30.00    Pack years: 60.00    Types: Cigarettes    Quit date: 09/23/2006    Years since quitting: 14.2   Smokeless tobacco: Never  Substance and Sexual Activity   Alcohol use: No    Alcohol/week: 0.0 standard drinks   Drug use: No   Sexual activity: Never  Other Topics Concern   Not on file  Social History Narrative   No health insurance   Social Determinants of Health   Financial Resource Strain: Not on file  Food Insecurity: Not on file  Transportation Needs: Not on file  Physical Activity: Not on file  Stress: Not on file  Social Connections: Not on  file     Family History: The patient's family history includes Cancer in his mother; Diabetes in his father and mother; Heart attack in his father. There is no history of Colon cancer, Colon polyps, Kidney disease, Gallbladder disease, or Esophageal cancer.  ROS:   Please see the history of present illness.     All other systems reviewed and are negative.  EKGs/Labs/Other Studies Reviewed:    The following studies were reviewed today:   EKG:  EKG is  ordered today.  The ekg ordered today demonstrates sinus rhythm, rate  59, left bundle branch block  Recent Labs: No results found for requested labs within last 8760 hours.  Recent Lipid Panel    Component Value Date/Time   CHOL 171 05/19/2013 1627   TRIG 419 (H) 05/19/2013 1627   HDL 38 (L) 05/19/2013 1627   CHOLHDL 4.5 05/19/2013 1627   VLDL NOT CALC 05/19/2013 1627   LDLCALC  05/19/2013 1627     Comment:       Not calculated due to Triglyceride >400. Suggest ordering Direct LDL (Unit Code: 09811).   Total Cholesterol/HDL Ratio:CHD Risk                        Coronary Heart Disease Risk Table                                        Men       Women          1/2 Average Risk              3.4        3.3              Average Risk              5.0        4.4           2X Average Risk              9.6        7.1           3X Average Risk             23.4       11.0 Use the calculated Patient Ratio above and the CHD Risk table  to determine the patient's CHD Risk. ATP III Classification (LDL):       < 100        mg/dL         Optimal      914 - 129     mg/dL         Near or Above Optimal      130 - 159     mg/dL         Borderline High      160 - 189     mg/dL         High       > 782        mg/dL         Very High     LDLDIRECT 67.7 06/09/2010 1147    Physical Exam:    VS:  BP (!) 148/62   Pulse (!) 59   Resp 20   Ht  (1.727 m)   Wt 190 lb 9.6 oz (86.5 kg)   SpO2 93%   BMI 28.98 kg/m     Wt Readings from  Last 3 Encounters:  12/07/20 190 lb 9.6 oz (86.5 kg)  03/18/20 220 lb 0.3 oz (99.8 kg)  02/11/16 220 lb (99.8 kg)     GEN:  Well nourished, well developed in no acute distress HEENT: Normal NECK: No JVD; No carotid bruits LYMPHATICS: No lymphadenopathy CARDIAC: RRR, no murmurs, rubs, gallops RESPIRATORY:  Clear to auscultation without rales, wheezing or rhonchi  ABDOMEN: Soft, non-tender, non-distended MUSCULOSKELETAL:  No edema; No deformity  SKIN: Warm and dry NEUROLOGIC:  Alert and oriented x 3 PSYCHIATRIC:  Normal affect   ASSESSMENT:    1. Abnormal EKG   2. Pain of left lower extremity   3. Essential hypertension   4. Hyperlipidemia, unspecified hyperlipidemia type   5. Tobacco use    PLAN:    Leg pain: Description suggests claudication, will check ABIs  LBBB: Check echocardiogram to evaluate for structural heart disease  Hypertension: On valsartan-HCTZ 320-25 mg daily, amlodipine 10 mg daily, Toprol-XL 100 mg daily.  BP elevated, will switch from Toprol-XL to carvedilol 12.5 mg twice daily.  Follow-up in pharmacy hypertension clinic in 2 weeks.  Hyperlipidemia: On atorvastatin 20 mg 3 times weekly  T2DM: On metformin, insulin  Tobacco use: Patient counseled on the risk of tobacco use and cessation strongly encouraged.  Offered to have care guide work with patient to aid in cessation but he declines at this time  RTC in 6 months   Medication Adjustments/Labs and Tests Ordered: Current medicines are reviewed at length with the patient today.  Concerns regarding medicines are outlined above.  Orders Placed This Encounter  Procedures   EKG 12-Lead   ECHOCARDIOGRAM COMPLETE   VAS [place] ABI WITH/WO TBI   VAS [place] LOWER EXTREMITY ARTERIAL DUPLEX    Meds ordered this encounter  Medications   DISCONTD: carvedilol (COREG) 12.5 MG tablet    Sig: Take 1 tablet (12.5 mg total) by mouth 2 (two) times daily.    Dispense:  180 tablet    Refill:  3    STOP metoprolol  (Toprol XL)   carvedilol (COREG) 12.5 MG tablet    Sig: Take 1 tablet (12.5 mg total) by mouth 2 (two) times daily.    Dispense:  60 tablet    Refill:  0    STOP metoprolol (Toprol XL)     Patient Instructions  Medication Instructions:  STOP metoprolol (Toprol XL)  START carvedilol (Coreg) 12.5 mg two times daily  *If you need a refill on your cardiac medications before your next appointment, please call your pharmacy*  Testing/Procedures: Your physician has requested that you have an echocardiogram. Echocardiography is a painless test that uses sound waves to create images of your heart. It provides your doctor with information about the size and shape of your heart and how well your heart's chambers and valves are working. This procedure takes approximately one hour. There are no restrictions for this procedure.  Your physician has requested that you have an ankle brachial index (ABI). During this test an ultrasound and blood pressure cuff are used to evaluate the arteries that supply the arms and legs with blood. Allow thirty minutes for this exam. There are no restrictions or special instructions.  Follow-Up: At [facility], you and your health needs are our priority.  As part of our continuing mission to provide you with exceptional heart care, we have created designated Provider Care Teams.  These Care Teams include your primary Cardiologist (physician) and Advanced Practice Providers (APPs -  Physician Assistants and Nurse Practitioners) who all work together to provide you with the care you need, when you need it.  We recommend signing up for the patient portal called "MyChart".  Sign up information is provided on this After Visit Summary.  MyChart is used to connect with patients for Virtual Visits (Telemedicine).  Patients are able to view lab/test results, encounter notes, upcoming appointments, etc.  Non-urgent messages can be sent to your provider as well.   To learn more  about what you  can do with MyChart, go to [email or web address].    Your next appointment:   2-4 weeks with pharmacist 6 months with Dr. Bjorn Krause   Signed, Todd Ishikawahristopher L Maryah Marinaro, MD  12/07/2020 7:36 PM    [facility]

## 2020-12-07 ENCOUNTER — Ambulatory Visit: Payer: Medicare HMO | Admitting: Cardiology

## 2020-12-07 ENCOUNTER — Other Ambulatory Visit: Payer: Self-pay

## 2020-12-07 ENCOUNTER — Encounter: Payer: Self-pay | Admitting: Cardiology

## 2020-12-07 VITALS — BP 148/62 | HR 59 | Resp 20 | Ht 68.0 in | Wt 190.6 lb

## 2020-12-07 DIAGNOSIS — E785 Hyperlipidemia, unspecified: Secondary | ICD-10-CM

## 2020-12-07 DIAGNOSIS — R9431 Abnormal electrocardiogram [ECG] [EKG]: Secondary | ICD-10-CM | POA: Diagnosis not present

## 2020-12-07 DIAGNOSIS — Z720 Tobacco use: Secondary | ICD-10-CM

## 2020-12-07 DIAGNOSIS — I10 Essential (primary) hypertension: Secondary | ICD-10-CM | POA: Diagnosis not present

## 2020-12-07 DIAGNOSIS — M79605 Pain in left leg: Secondary | ICD-10-CM | POA: Diagnosis not present

## 2020-12-07 MED ORDER — CARVEDILOL 12.5 MG PO TABS: 12.5000 mg | ORAL_TABLET | Freq: Two times a day (BID) | ORAL | 3 refills | Status: DC

## 2020-12-07 MED ORDER — CARVEDILOL 12.5 MG PO TABS: 12.5000 mg | ORAL_TABLET | Freq: Two times a day (BID) | ORAL | 0 refills | Status: DC

## 2020-12-07 NOTE — Patient Instructions (Signed)
Medication Instructions:  STOP metoprolol (Toprol XL)  START carvedilol (Coreg) 12.5 mg two times daily  *If you need a refill on your cardiac medications before your next appointment, please call your pharmacy*  Testing/Procedures: Your physician has requested that you have an echocardiogram. Echocardiography is a painless test that uses sound waves to create images of your heart. It provides your doctor with information about the size and shape of your heart and how well your heart's chambers and valves are working. This procedure takes approximately one hour. There are no restrictions for this procedure.  Your physician has requested that you have an ankle brachial index (ABI). During this test an ultrasound and blood pressure cuff are used to evaluate the arteries that supply the arms and legs with blood. Allow thirty minutes for this exam. There are no restrictions or special instructions.  Follow-Up: At [facility], you and your health needs are our priority.  As part of our continuing mission to provide you with exceptional heart care, we have created designated Provider Care Teams.  These Care Teams include your primary Cardiologist (physician) and Advanced Practice Providers (APPs -  Physician Assistants and Nurse Practitioners) who all work together to provide you with the care you need, when you need it.  We recommend signing up for the patient portal called "MyChart".  Sign up information is provided on this After Visit Summary.  MyChart is used to connect with patients for Virtual Visits (Telemedicine).  Patients are able to view lab/test results, encounter notes, upcoming appointments, etc.  Non-urgent messages can be sent to your provider as well.   To learn more about what you can do with MyChart, go to [email or web address].    Your next appointment:   2-4 weeks with pharmacist 6 months with Dr. Bjorn Pippin

## 2020-12-16 ENCOUNTER — Other Ambulatory Visit: Payer: Self-pay | Admitting: Cardiology

## 2020-12-16 DIAGNOSIS — I739 Peripheral vascular disease, unspecified: Secondary | ICD-10-CM

## 2020-12-16 DIAGNOSIS — M79605 Pain in left leg: Secondary | ICD-10-CM

## 2020-12-20 ENCOUNTER — Ambulatory Visit: Payer: Medicare HMO

## 2020-12-21 ENCOUNTER — Other Ambulatory Visit: Payer: Self-pay

## 2020-12-21 ENCOUNTER — Encounter (HOSPITAL_COMMUNITY): Payer: Medicare HMO

## 2020-12-21 ENCOUNTER — Ambulatory Visit (HOSPITAL_COMMUNITY)
Admission: RE | Admit: 2020-12-21 | Discharge: 2020-12-21 | Disposition: A | Discharge status: Home / Self Care | Payer: Medicare HMO | Source: Ambulatory Visit | Attending: Cardiology | Admitting: Cardiology

## 2020-12-21 ENCOUNTER — Ambulatory Visit: Payer: Medicare HMO

## 2020-12-21 DIAGNOSIS — M79605 Pain in left leg: Secondary | ICD-10-CM | POA: Insufficient documentation

## 2020-12-21 DIAGNOSIS — I739 Peripheral vascular disease, unspecified: Secondary | ICD-10-CM | POA: Insufficient documentation

## 2020-12-23 ENCOUNTER — Other Ambulatory Visit: Payer: Self-pay

## 2020-12-23 ENCOUNTER — Ambulatory Visit (INDEPENDENT_AMBULATORY_CARE_PROVIDER_SITE_OTHER): Payer: Medicare HMO | Admitting: Pharmacist

## 2020-12-23 VITALS — BP 162/66 | HR 68

## 2020-12-23 DIAGNOSIS — I10 Essential (primary) hypertension: Secondary | ICD-10-CM | POA: Diagnosis not present

## 2020-12-23 MED ORDER — SPIRONOLACTONE 25 MG PO TABS: 12.5000 mg | ORAL_TABLET | Freq: Every day | ORAL | 0 refills | Status: DC

## 2020-12-23 NOTE — Patient Instructions (Addendum)
It was nice meeting you today  We would like your blood pressure to be less than 130/80  Continue: Carvedilol 12.5mg  twice a day Amlodipine 10mg  once a day Valsartan/HCTZ 320/25mg  once a day  We will start a new medication called spironolactone 25mg .  I would like you to take 1/2 tablet once a day.    We will check your lab work in one week  Try to cut down on your caffeine intake.  Try mixing your coffee with some decaf coffee.    Please call with any questions!  , PharmD, BCACP, CDCES, CPP [facility] [address] [place], [address] [place] Phone: [phone]; Fax: [phone] 12/23/2020 2:56 PM

## 2020-12-23 NOTE — Progress Notes (Signed)
Patient ID: Todd Krause                 DOB: 11-Jan-1951                      MRN: [id number]     HPI: Todd Krause is a 70 y.o. male referred by Dr. Bjorn Pippin to HTN clinic. PMH is significant for HTN, smoking, and T2DM.  Seen 2 weeks ago by Dr. Bjorn Pippin who changed Toprol to carvedilol 12.5mg  and referred to HTN clinic.  Patient presents today in good spirits.  Has not been checking his blood pressure at home and reports no adverse effects to carvedilol.  Is still working. 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  Eats 2 meals a day and snacks on the road, typically sandwiches and oatmeal cream pies    Patient reports he drinks coffee all day except for dinner when he drinks sugar free mountain dew . Reports morning blood sugars are in the 170s-190s.  Smokes 1-2 packs of cigarettes a day.  Says he has uncontrolled HTN for years.  Reports medication compliance.  Current HTN meds: carvedilol 12.5mg  BID, amlodipine 10mg  daily, valsartan/HCTZ 320/25 daily Previously tried:  BP goal: <130/80  Wt Readings from Last 3 Encounters:  12/07/20 190 lb 9.6 oz (86.5 kg)  03/18/20 220 lb 0.3 oz (99.8 kg)  02/11/16 220 lb (99.8 kg)   BP Readings from Last 3 Encounters:  12/07/20 (!) 148/62  03/18/20 (!) 163/72  02/14/16 (!) 158/68   Pulse Readings from Last 3 Encounters:  12/07/20 (!) 59  03/18/20 (!) 103  02/14/16 71    Renal function: CrCl cannot be calculated (Patient's most recent lab result is older than the maximum 21 days allowed.).  Past Medical History:  Diagnosis Date   Arthritis    hands, knees   BPH (benign prostatic hypertrophy)    Chronic bronchitis (HCC)    ED (erectile dysfunction)    Elevated PSA    GERD (gastroesophageal reflux disease)    History of colon polyps    Hyperlipidemia    Hypertension    Lower urinary tract symptoms (LUTS)    OSA (obstructive sleep  apnea)    mild to moderate osa per study 02-18-2004--  no cpap   Prostate nodule    Type 2 diabetes mellitus (HCC)     Current Outpatient Medications on File Prior to Visit  Medication Sig Dispense Refill   acetaminophen (TYLENOL) 500 MG tablet Take 1,000 mg by mouth every 6 (six) hours as needed for moderate pain.      albuterol (PROVENTIL) (2.5 MG/3ML) 0.083% nebulizer solution Take 2.5 mg by nebulization every 6 (six) hours as needed for wheezing or shortness of breath.      amLODipine (NORVASC) 10 MG tablet Take 1 tablet by mouth daily.     Aspirin-Acetaminophen-Caffeine (GOODY HEADACHE PO) Take 1 each by mouth daily as needed (pain).     atorvastatin (LIPITOR) 20 MG tablet Take 20 mg by mouth 3 (three) times a week.     carvedilol (COREG) 12.5 MG tablet Take 1 tablet (12.5 mg total) by mouth 2 (two) times daily. 60 tablet 0   celecoxib (CELEBREX) 200 MG capsule Take 1 capsule by mouth 2 (two) times daily.     Cholecalciferol (VITAMIN D3) 2000 UNITS TABS Take 1 capsule by mouth every evening.  finasteride (PROSCAR) 5 MG tablet Take 1 tablet (5 mg total) by mouth daily. (Patient taking differently: Take 5 mg by mouth every evening.) 30 tablet 1   finasteride (PROSCAR) 5 MG tablet Take 1 tablet by mouth daily.     Insulin Glargine (LANTUS) 100 UNIT/ML Solostar Pen Inject 120 Units into the skin daily at 10 pm.      Liraglutide 18 MG/3ML SOPN Inject 1.8 mg into the skin every morning.     metFORMIN (GLUCOPHAGE) 1000 MG tablet Take 1,000 mg by mouth 2 (two) times daily.     Multiple Vitamin (MULTIVITAMIN) tablet Take 1 tablet by mouth every evening.      naproxen sodium (ANAPROX) 220 MG tablet Take 440 mg by mouth every 12 (twelve) hours as needed (pain).     omeprazole (PRILOSEC) 20 MG capsule Take 1 capsule (20 mg total) by mouth daily. (Patient taking differently: Take 20 mg by mouth 2 (two) times daily.) 90 capsule 0   Red Yeast Rice 600 MG CAPS Take 600 mg by mouth every evening.       tamsulosin (FLOMAX) 0.4 MG CAPS capsule Take 1 capsule by mouth 2 (two) times daily.     valsartan-hydrochlorothiazide (DIOVAN-HCT) 320-25 MG tablet Take 1 tablet by mouth daily.     vitamin B-12 (CYANOCOBALAMIN) 1000 MCG tablet Take 1,000 mcg by mouth every evening.      No current facility-administered medications on file prior to visit.    Allergies  Allergen Reactions   Latex Hives   Tramadol Rash   Codeine Other (See Comments)    constipation     Assessment/Plan:  1. Hypertension -  Patient BP in room today 162/66 which is above goal of <130/80 and is actually increased since last visit.  Patient did not know what his BP goal was.    Initial plan was to increase carvedilol to 18.75mg  BID or 25mg  BID however patient drives long distances every day. Concern over being tired on the road and bradycardia.  Instead will start spironolactone 12.5mg  daily and check labs in 1 week.  Patient BP also likely impacted by his caffeine intake.  Recommended he cut down on his coffee and mountain dew consumption or switching to decaf or half caffeine.  Patient voiced understanding.  Will recheck in 4 weeks.  Continue amlodipine 10mg  daily Continue valsartan/HCTZ 320/25mg  daily Continue carvedilol 12.5mg  BID Start spironolactone 12.5mg  daily Check BMP in 1 week Recheck in clinic in 4 weeks  , PharmD, BCACP, CDCES, CPP [facility] [address] [place], [address] [place] Phone: [phone]; Fax: [phone] 12/23/2020 3:56 PM

## 2020-12-26 ENCOUNTER — Telehealth: Payer: Self-pay | Admitting: Cardiology

## 2020-12-26 NOTE — Telephone Encounter (Signed)
Pt returning call for results... please advise  

## 2020-12-26 NOTE — Telephone Encounter (Signed)
Little Ishikawa, MD  12/22/2020  2:10 PM EDT     Blockages in both legs are seen, recommend referral to Dr Allyson Sabal or Dr Kirke Corin for evaluation   Little Ishikawa, MD  12/22/2020  2:09 PM EDT     ABIs suggest moderate peripheral arterial disease in both legs.  Can we refer to Dr. Allyson Sabal or Dr. Kirke Corin for evaluation?    RN returned patient call. Patient verbalized understanding result of  doppler and ABI's. Patient request an afternoon appointment due to his work schedule. Appointment schedule for Sept 30,2022 at 3 pm  reminder mailed to patient.

## 2020-12-27 ENCOUNTER — Other Ambulatory Visit: Payer: Self-pay

## 2020-12-27 DIAGNOSIS — I10 Essential (primary) hypertension: Secondary | ICD-10-CM

## 2020-12-28 ENCOUNTER — Other Ambulatory Visit: Payer: Self-pay

## 2020-12-28 ENCOUNTER — Encounter (INDEPENDENT_AMBULATORY_CARE_PROVIDER_SITE_OTHER): Payer: Self-pay

## 2020-12-28 ENCOUNTER — Ambulatory Visit (HOSPITAL_COMMUNITY): Payer: Medicare HMO | Attending: Cardiovascular Disease

## 2020-12-28 DIAGNOSIS — R9431 Abnormal electrocardiogram [ECG] [EKG]: Secondary | ICD-10-CM | POA: Insufficient documentation

## 2020-12-28 LAB — BASIC METABOLIC PANEL
BUN/Creatinine Ratio: 20 (ref 10–24)
BUN: 14 mg/dL (ref 8–27)
CO2: 26 mmol/L (ref 20–29)
Calcium: 9.7 mg/dL (ref 8.6–10.2)
Chloride: 96 mmol/L (ref 96–106)
Creatinine, Ser: 0.69 mg/dL — ABNORMAL LOW (ref 0.76–1.27)
Glucose: 152 mg/dL — ABNORMAL HIGH (ref 65–99)
Potassium: 4.7 mmol/L (ref 3.5–5.2)
Sodium: 136 mmol/L (ref 134–144)
eGFR: 98 mL/min/{1.73_m2} (ref >59)

## 2020-12-28 LAB — ECHOCARDIOGRAM COMPLETE
Area-P 1/2: 3.45 cm2
S' Lateral: 3.7 cm

## 2020-12-29 ENCOUNTER — Telehealth: Payer: Self-pay | Admitting: Pharmacist

## 2020-12-29 ENCOUNTER — Other Ambulatory Visit: Payer: Self-pay | Admitting: Cardiology

## 2020-12-29 NOTE — Telephone Encounter (Signed)
Spoke with patient.  Relayed lab results normal since starting spiro.  BP remains elevated likely from coffee consumption.  Recheck in clinic in 3 weeks

## 2021-01-13 ENCOUNTER — Encounter: Payer: Self-pay | Admitting: *Deleted

## 2021-01-17 ENCOUNTER — Other Ambulatory Visit: Payer: Self-pay

## 2021-01-17 ENCOUNTER — Ambulatory Visit: Payer: Medicare HMO | Admitting: Pharmacist Clinician (PhC)/ Clinical Pharmacy Specialist

## 2021-01-17 DIAGNOSIS — I10 Essential (primary) hypertension: Secondary | ICD-10-CM

## 2021-01-17 NOTE — Patient Instructions (Signed)
Return for a a follow up appointment November 29 at 3:00 pm  Take your BP meds as follows:  Increase spironolactone to 25 mg (1 tablet) daily.  Move amlodipine 10 mg to evenings instead of mornings  Continue with all other medications  Bring all of your meds, your BP cuff and your record of home blood pressures to your next appointment.  Exercise as you're able, try to walk approximately 30 minutes per day.  Keep salt intake to a minimum, especially watch canned and prepared boxed foods.  Eat more fresh fruits and vegetables and fewer canned items.  Avoid eating in fast food restaurants.    HOW TO TAKE YOUR BLOOD PRESSURE: Rest 5 minutes before taking your blood pressure.  Don't smoke or drink caffeinated beverages for at least 30 minutes before. Take your blood pressure before (not after) you eat. Sit comfortably with your back supported and both feet on the floor (don't cross your legs). Elevate your arm to heart level on a table or a desk. Use the proper sized cuff. It should fit smoothly and snugly around your bare upper arm. There should be enough room to slip a fingertip under the cuff. The bottom edge of the cuff should be 1 inch above the crease of the elbow. Ideally, take 3 measurements at one sitting and record the average.

## 2021-01-17 NOTE — Progress Notes (Signed)
01/18/2021 TRAN [name] Apr 13, 1951 [id number]   HPI:  Todd Krause is a 70 y.o. male patient of Dr Bjorn Pippin, who presents today for hypertension clinic evaluation.  PMH is significant for HTN, smoking, and T2DM.  He was seen by Dr. Bjorn Pippin in August and because of elevated BP, metoprolol was switched to carvedilol 12.5 mg bid.  He did fine with this medication change and was then seen after 2 weeks by Laural Golden PharmD.  Pressure at that appointment remained elevated, at 162/66 and spironolactone 12.5 mg was added.  At that time he noted he was driving regularly between [place] and [place], and often snacked while in the car (oatmeal cream pies, coffee).    Today he returns for follow up.  He has not noticed any problems with the addition of spironolactone and states compliance with all his medications.  Patient does have an upcoming appointment with Dr. Allyson Sabal after lower extremity arterial duplex showed moderate arterial disease in both legs.  He does smoke about 1 ppd.     Past Medical History: hyperlipidemia No recent LDL  - on atorvastatin 20   DM2 No A1c available, currently on Novolin 70/30 and metformin  BPH Elevated PSA - on finasteride and tamsulosin     Blood Pressure Goal:  130/80  Current Medications: amlodipine 10 mg qd, valsartan hct 320/25 mg qd, carvedilol 12.5 mg bid, spironolactone 12.5 mg qd  (all am + carvedilol bid)  Family Hx: grandparents and parents all had hypertension; father had MI, defibrillator; son with hypertension, 2 brothers 1 sister, only 1 brother living now, all had hypertension as well  Social Hx: smokes 1 ppd; only occasional wine, drinks home brewed coffee daily as well as [employer],   Diet: typically eats 2 meals per day, snacks regularly while in car (sandwiches, oatmeal cream pies), eats more home cooked meals; veggies are usually fresh   Exercise:  difficulty with walking because of venous insuvfficiency - has  upcoming appointment with Dr. Allyson Sabal  Home BP readings:  no home readings  Intolerances: no cardiac medication intolerances  Labs: 8/22:  Na 136, K 4.7, Glu 152, BUN 14, SCr 0.69, GFR 98   Wt Readings from Last 3 Encounters:  01/17/21 186 lb (84.4 kg)  12/07/20 190 lb 9.6 oz (86.5 kg)  03/18/20 220 lb 0.3 oz (99.8 kg)   BP Readings from Last 3 Encounters:  01/17/21 (!) 142/58  12/23/20 (!) 162/66  12/07/20 (!) 148/62   Pulse Readings from Last 3 Encounters:  01/17/21 68  12/23/20 68  12/07/20 (!) 59    Current Outpatient Medications  Medication Sig Dispense Refill   acetaminophen (TYLENOL) 500 MG tablet Take 1,000 mg by mouth every 6 (six) hours as needed for moderate pain.      albuterol (PROVENTIL) (2.5 MG/3ML) 0.083% nebulizer solution Take 2.5 mg by nebulization every 6 (six) hours as needed for wheezing or shortness of breath.      Alcohol Swabs (ALCOHOL WIPES) 70 % PADS Patient checks blood sugars bid     amLODipine (NORVASC) 10 MG tablet Take 1 tablet by mouth daily.     Aspirin-Acetaminophen-Caffeine (GOODY HEADACHE PO) Take 1 each by mouth daily as needed (pain).     atorvastatin (LIPITOR) 20 MG tablet Take 20 mg by mouth 3 (three) times a week.     Blood Glucose Monitoring Suppl (ACCU-CHEK GUIDE) w/Device KIT Use as directed by physician to monitor blood sugars     carvedilol (  COREG) 12.5 MG tablet TAKE 1 TABLET BY MOUTH 2 TIMES DAILY. 180 tablet 3   celecoxib (CELEBREX) 200 MG capsule Take 1 capsule by mouth 2 (two) times daily.     Cholecalciferol (VITAMIN D3) 2000 UNITS TABS Take 1 capsule by mouth every evening.      finasteride (PROSCAR) 5 MG tablet Take 1 tablet (5 mg total) by mouth daily. (Patient taking differently: Take 5 mg by mouth every evening.) 30 tablet 1   finasteride (PROSCAR) 5 MG tablet Take 1 tablet by mouth daily.     glucose blood (PRECISION QID TEST) test strip Use to monitor blood glucose 3 time(s) daily     insulin isophane & regular human  KwikPen (NOVOLIN 70/30 KWIKPEN) (70-30) 100 UNIT/ML KwikPen INJECT 40-60 UNITS 30 MIN BEFORE BREAKFAST, INJECT 20-40 UNITS 30 MIN BEFORE EVENING MEAL BASED ON TITRATION GUIDELINES **Maximum daily dose 100 units**     metFORMIN (GLUCOPHAGE) 1000 MG tablet Take 1,000 mg by mouth 2 (two) times daily.     Multiple Vitamin (MULTIVITAMIN) tablet Take 1 tablet by mouth every evening.      naproxen sodium (ANAPROX) 220 MG tablet Take 440 mg by mouth every 12 (twelve) hours as needed (pain).     omeprazole (PRILOSEC) 20 MG capsule Take 1 capsule (20 mg total) by mouth daily. (Patient taking differently: Take 20 mg by mouth 2 (two) times daily.) 90 capsule 0   Red Yeast Rice 600 MG CAPS Take 600 mg by mouth every evening.      spironolactone (ALDACTONE) 25 MG tablet Take 1 tablet (25 mg total) by mouth daily. 90 tablet 3   tamsulosin (FLOMAX) 0.4 MG CAPS capsule Take 1 capsule by mouth 2 (two) times daily.     valsartan-hydrochlorothiazide (DIOVAN-HCT) 320-25 MG tablet Take 1 tablet by mouth daily.     vitamin B-12 (CYANOCOBALAMIN) 1000 MCG tablet Take 1,000 mcg by mouth every evening.      No current facility-administered medications for this visit.    Allergies  Allergen Reactions   Latex Hives   Tramadol Rash   Codeine Other (See Comments)    constipation    Past Medical History:  Diagnosis Date   Arthritis    hands, knees   BPH (benign prostatic hypertrophy)    Chronic bronchitis (HCC)    ED (erectile dysfunction)    Elevated PSA    GERD (gastroesophageal reflux disease)    History of colon polyps    Hyperlipidemia    Hypertension    Lower urinary tract symptoms (LUTS)    OSA (obstructive sleep apnea)    mild to moderate osa per study 02-18-2004--  no cpap   Prostate nodule    Type 2 diabetes mellitus (HCC)     Blood pressure (!) 142/58, pulse 68, resp. rate 14, height $RemoveBe'5\' 8"'[id number]$  (1.727 m), weight 186 lb (84.4 kg), SpO2 91 %.  Essential hypertension Patient with essential  hypertension, pressure improved but not yet to goal.  Will have him move amlodipine to evenings to help divide medication doses, as well as have him increase the spironolactone to 25 mg daily.  He does not have a home BP device.  Will have him return to the office in 2 months for follow up.     Tommy Medal PharmD CPP [facility] [address] [place], [place] [address] [phone]

## 2021-01-18 ENCOUNTER — Encounter: Payer: Self-pay | Admitting: Pharmacist Clinician (PhC)/ Clinical Pharmacy Specialist

## 2021-01-18 MED ORDER — SPIRONOLACTONE 25 MG PO TABS: 25.0000 mg | ORAL_TABLET | Freq: Every day | ORAL | 3 refills | Status: DC

## 2021-01-18 NOTE — Assessment & Plan Note (Signed)
Patient with essential hypertension, pressure improved but not yet to goal.  Will have him move amlodipine to evenings to help divide medication doses, as well as have him increase the spironolactone to 25 mg daily.  He does not have a home BP device.  Will have him return to the office in 2 months for follow up.

## 2021-01-25 ENCOUNTER — Telehealth: Payer: Self-pay

## 2021-01-25 NOTE — Telephone Encounter (Signed)
Notes scanned to referral rj ?

## 2021-01-27 ENCOUNTER — Encounter: Payer: Self-pay | Admitting: Cardiovascular Disease

## 2021-01-27 ENCOUNTER — Ambulatory Visit: Payer: Medicare HMO | Admitting: Cardiovascular Disease

## 2021-01-27 ENCOUNTER — Other Ambulatory Visit: Payer: Self-pay

## 2021-01-27 DIAGNOSIS — I739 Peripheral vascular disease, unspecified: Secondary | ICD-10-CM

## 2021-01-27 NOTE — H&P (View-Only) (Signed)
01/27/2021 Todd Krause   [birth year]  [id number]  Primary Physician Vicenta Aly, [place] Primary Cardiologist: Lorretta Harp MD Lupe Carney, [place]  HPI:  Todd Krause is a 70 y.o. mildly overweight widowed Caucasian male father of 5 children, grandfather of 6 grandchildren referred by Dr. Gardiner Rhyme  for peripheral vascular valuation because of lifestyle limiting claudication.  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  His risk factors are notable for over 50 pack years of tobacco abuse continuing to smoke 1 pack/day as well as treated hypertension, hyperlipidemia and diabetes.  His father did have a myocardial infarction.  He is never had a heart attack or stroke.  He denies chest pain or shortness of breath.  He does have claudication left greater than right which is lifestyle limiting and which he has had for the last 3 to 4 years.  Recent Doppler studies performed 12/21/2020 revealed a right ABI of 0.59 and a left of 0.72.  He did have a high-frequency signal in his right common iliac artery and left common femoral artery.   Current Meds  Medication Sig   acetaminophen (TYLENOL) 500 MG tablet Take 1,000 mg by mouth every 6 (six) hours as needed for moderate pain.    Alcohol Swabs (ALCOHOL WIPES) 70 % PADS Patient checks blood sugars bid   amLODipine (NORVASC) 10 MG tablet Take 1 tablet by mouth daily.   Aspirin-Acetaminophen-Caffeine (GOODY HEADACHE PO) Take 1 each by mouth daily as needed (pain).   atorvastatin (LIPITOR) 20 MG tablet Take 20 mg by mouth 3 (three) times a week.   Blood Glucose Monitoring Suppl (ACCU-CHEK GUIDE) w/Device KIT Use as directed by physician to monitor blood sugars   carvedilol (COREG) 12.5 MG tablet TAKE 1 TABLET BY MOUTH 2 TIMES DAILY.   celecoxib (CELEBREX) 200 MG capsule Take 1 capsule by mouth 2 (two) times daily.   Cholecalciferol (VITAMIN D3) 2000 UNITS TABS Take 1 capsule by  mouth every evening.    finasteride (PROSCAR) 5 MG tablet Take 1 tablet (5 mg total) by mouth daily. (Patient taking differently: Take 5 mg by mouth every evening.)   insulin isophane & regular human KwikPen (NOVOLIN 70/30 KWIKPEN) (70-30) 100 UNIT/ML KwikPen INJECT 40-60 UNITS 30 MIN BEFORE BREAKFAST, INJECT 20-40 UNITS 30 MIN BEFORE EVENING MEAL BASED ON TITRATION GUIDELINES **Maximum daily dose 100 units**   metFORMIN (GLUCOPHAGE) 1000 MG tablet Take 1,000 mg by mouth 2 (two) times daily.   Multiple Vitamin (MULTIVITAMIN) tablet Take 1 tablet by mouth every evening.    omeprazole (PRILOSEC) 20 MG capsule Take 1 capsule (20 mg total) by mouth daily. (Patient taking differently: Take 20 mg by mouth 2 (two) times daily.)   Red Yeast Rice 600 MG CAPS Take 600 mg by mouth every evening.    spironolactone (ALDACTONE) 25 MG tablet Take 1 tablet (25 mg total) by mouth daily.   tamsulosin (FLOMAX) 0.4 MG CAPS capsule Take 1 capsule by mouth 2 (two) times daily.   valsartan-hydrochlorothiazide (DIOVAN-HCT) 320-25 MG tablet Take 1 tablet by mouth daily.   vitamin B-12 (CYANOCOBALAMIN) 1000 MCG tablet Take 1,000 mcg by mouth every evening.      Allergies  Allergen Reactions   Latex Hives   Tramadol Rash   Codeine Other (See Comments)    constipation    Social History   Socioeconomic History   Marital status: Married    Spouse name: Not on file  Number of children: 2   Years of education: Not on file   Highest education level: Not on file  Occupational History   Occupation: Psychologist, sport and exercise (cattle)  Tobacco Use   Smoking status: Every Day    Packs/day: 1.00    Years: 30.00    Pack years: 30.00    Types: Cigarettes    Last attempt to quit: 09/23/2006    Years since quitting: 14.3   Smokeless tobacco: Never  Substance and Sexual Activity   Alcohol use: No    Alcohol/week: 0.0 standard drinks   Drug use: No   Sexual activity: Never  Other Topics Concern   Not on file  Social History  Narrative   No health insurance   Social Determinants of Health   Financial Resource Strain: Not on file  Food Insecurity: Not on file  Transportation Needs: Not on file  Physical Activity: Not on file  Stress: Not on file  Social Connections: Not on file  Intimate Partner Violence: Not on file     Review of Systems: General: negative for chills, fever, night sweats or weight changes.  Cardiovascular: negative for chest pain, dyspnea on exertion, edema, orthopnea, palpitations, paroxysmal nocturnal dyspnea or shortness of breath Dermatological: negative for rash Respiratory: negative for cough or wheezing Urologic: negative for hematuria Abdominal: negative for nausea, vomiting, diarrhea, bright red blood per rectum, melena, or hematemesis Neurologic: negative for visual changes, syncope, or dizziness All other systems reviewed and are otherwise negative except as noted above.    Blood pressure (!) 142/66, pulse 84, height _0  (1.727 m), weight 183 lb 3.2 oz (83.1 kg), SpO2 96 %.  General appearance: alert and no distress Neck: no adenopathy, no carotid bruit, no JVD, supple, symmetrical, trachea midline, and thyroid not enlarged, symmetric, no tenderness/mass/nodules Lungs: clear to auscultation bilaterally Heart: regular rate and rhythm, S1, S2 normal, no murmur, click, rub or gallop Extremities: extremities normal, atraumatic, no cyanosis or edema Pulses: 2+ and symmetric Skin: Skin color, texture, turgor normal. No rashes or lesions Neurologic: Grossly normal  EKG not performed today  ASSESSMENT AND PLAN:   Peripheral arterial disease ([place]) Mr. Halterman was referred by Dr. Gardiner Rhyme for evaluation of symptomatic PAD.  He has a history of ongoing tobacco abuse, treated hypertension, diabetes and hyperlipidemia.  He has had claudication left greater than right for last 3 to 4 years.  Recent Doppler studies performed 12/21/2020 revealed a right ABI of 0.59 and a left of  0.72.  He did have a high-frequency signal in his right common iliac artery and left common femoral artery.  He wishes to proceed with outpatient peripheral angiography and endovascular therapy for lifestyle limiting claudication.     Lorretta Harp MD FACP,FACC,FAHA, [facility] 01/27/2021 2:59 PM

## 2021-01-27 NOTE — Progress Notes (Signed)
01/27/2021 Todd Krause   [birth year]  [id number]  Primary Physician Vicenta Aly, [place] Primary Cardiologist: Lorretta Harp MD Lupe Carney, [place]  HPI:  Todd Krause is a 70 y.o. mildly overweight widowed Caucasian male father of 5 children, grandfather of 6 grandchildren referred by Dr. Gardiner Rhyme  for peripheral vascular valuation because of lifestyle limiting claudication.  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  His risk factors are notable for over 50 pack years of tobacco abuse continuing to smoke 1 pack/day as well as treated hypertension, hyperlipidemia and diabetes.  His father did have a myocardial infarction.  He is never had a heart attack or stroke.  He denies chest pain or shortness of breath.  He does have claudication left greater than right which is lifestyle limiting and which he has had for the last 3 to 4 years.  Recent Doppler studies performed 12/21/2020 revealed a right ABI of 0.59 and a left of 0.72.  He did have a high-frequency signal in his right common iliac artery and left common femoral artery.   Current Meds  Medication Sig   acetaminophen (TYLENOL) 500 MG tablet Take 1,000 mg by mouth every 6 (six) hours as needed for moderate pain.    Alcohol Swabs (ALCOHOL WIPES) 70 % PADS Patient checks blood sugars bid   amLODipine (NORVASC) 10 MG tablet Take 1 tablet by mouth daily.   Aspirin-Acetaminophen-Caffeine (GOODY HEADACHE PO) Take 1 each by mouth daily as needed (pain).   atorvastatin (LIPITOR) 20 MG tablet Take 20 mg by mouth 3 (three) times a week.   Blood Glucose Monitoring Suppl (ACCU-CHEK GUIDE) w/Device KIT Use as directed by physician to monitor blood sugars   carvedilol (COREG) 12.5 MG tablet TAKE 1 TABLET BY MOUTH 2 TIMES DAILY.   celecoxib (CELEBREX) 200 MG capsule Take 1 capsule by mouth 2 (two) times daily.   Cholecalciferol (VITAMIN D3) 2000 UNITS TABS Take 1 capsule by  mouth every evening.    finasteride (PROSCAR) 5 MG tablet Take 1 tablet (5 mg total) by mouth daily. (Patient taking differently: Take 5 mg by mouth every evening.)   insulin isophane & regular human KwikPen (NOVOLIN 70/30 KWIKPEN) (70-30) 100 UNIT/ML KwikPen INJECT 40-60 UNITS 30 MIN BEFORE BREAKFAST, INJECT 20-40 UNITS 30 MIN BEFORE EVENING MEAL BASED ON TITRATION GUIDELINES **Maximum daily dose 100 units**   metFORMIN (GLUCOPHAGE) 1000 MG tablet Take 1,000 mg by mouth 2 (two) times daily.   Multiple Vitamin (MULTIVITAMIN) tablet Take 1 tablet by mouth every evening.    omeprazole (PRILOSEC) 20 MG capsule Take 1 capsule (20 mg total) by mouth daily. (Patient taking differently: Take 20 mg by mouth 2 (two) times daily.)   Red Yeast Rice 600 MG CAPS Take 600 mg by mouth every evening.    spironolactone (ALDACTONE) 25 MG tablet Take 1 tablet (25 mg total) by mouth daily.   tamsulosin (FLOMAX) 0.4 MG CAPS capsule Take 1 capsule by mouth 2 (two) times daily.   valsartan-hydrochlorothiazide (DIOVAN-HCT) 320-25 MG tablet Take 1 tablet by mouth daily.   vitamin B-12 (CYANOCOBALAMIN) 1000 MCG tablet Take 1,000 mcg by mouth every evening.      Allergies  Allergen Reactions   Latex Hives   Tramadol Rash   Codeine Other (See Comments)    constipation    Social History   Socioeconomic History   Marital status: Married    Spouse name: Not on file  Number of children: 2   Years of education: Not on file   Highest education level: Not on file  Occupational History   Occupation: Psychologist, sport and exercise (cattle)  Tobacco Use   Smoking status: Every Day    Packs/day: 1.00    Years: 30.00    Pack years: 30.00    Types: Cigarettes    Last attempt to quit: 09/23/2006    Years since quitting: 14.3   Smokeless tobacco: Never  Substance and Sexual Activity   Alcohol use: No    Alcohol/week: 0.0 standard drinks   Drug use: No   Sexual activity: Never  Other Topics Concern   Not on file  Social History  Narrative   No health insurance   Social Determinants of Health   Financial Resource Strain: Not on file  Food Insecurity: Not on file  Transportation Needs: Not on file  Physical Activity: Not on file  Stress: Not on file  Social Connections: Not on file  Intimate Partner Violence: Not on file     Review of Systems: General: negative for chills, fever, night sweats or weight changes.  Cardiovascular: negative for chest pain, dyspnea on exertion, edema, orthopnea, palpitations, paroxysmal nocturnal dyspnea or shortness of breath Dermatological: negative for rash Respiratory: negative for cough or wheezing Urologic: negative for hematuria Abdominal: negative for nausea, vomiting, diarrhea, bright red blood per rectum, melena, or hematemesis Neurologic: negative for visual changes, syncope, or dizziness All other systems reviewed and are otherwise negative except as noted above.    Blood pressure (!) 142/66, pulse 84, height _0  (1.727 m), weight 183 lb 3.2 oz (83.1 kg), SpO2 96 %.  General appearance: alert and no distress Neck: no adenopathy, no carotid bruit, no JVD, supple, symmetrical, trachea midline, and thyroid not enlarged, symmetric, no tenderness/mass/nodules Lungs: clear to auscultation bilaterally Heart: regular rate and rhythm, S1, S2 normal, no murmur, click, rub or gallop Extremities: extremities normal, atraumatic, no cyanosis or edema Pulses: 2+ and symmetric Skin: Skin color, texture, turgor normal. No rashes or lesions Neurologic: Grossly normal  EKG not performed today  ASSESSMENT AND PLAN:   Peripheral arterial disease ([place]) Mr. Halterman was referred by Dr. Gardiner Rhyme for evaluation of symptomatic PAD.  He has a history of ongoing tobacco abuse, treated hypertension, diabetes and hyperlipidemia.  He has had claudication left greater than right for last 3 to 4 years.  Recent Doppler studies performed 12/21/2020 revealed a right ABI of 0.59 and a left of  0.72.  He did have a high-frequency signal in his right common iliac artery and left common femoral artery.  He wishes to proceed with outpatient peripheral angiography and endovascular therapy for lifestyle limiting claudication.     Lorretta Harp MD FACP,FACC,FAHA, [facility] 01/27/2021 2:59 PM

## 2021-01-27 NOTE — Patient Instructions (Signed)
  [facility] CARDIOVASCULAR DIVISION [facility] NORTHLINE [address] [place] [address] Dept: [phone] Loc: [phone]  Todd Krause  01/27/2021  You are scheduled for a Peripheral Angiogram on Monday, October 10 with Dr. Nanetta Batty.  1. Please arrive at the [facility] (Main Entrance A) at [facility]: [address] [place], [place] [address] at 5:30 AM (This time is two hours before your procedure to ensure your preparation). Free valet parking service is available.   Special note: Every effort is made to have your procedure done on time. Please understand that emergencies sometimes delay scheduled procedures.  2. Diet: Do not eat solid foods after midnight.  The patient may have clear liquids until 5am upon the day of the procedure.  3. Labs: You will need to have blood drawn today.  4. Medication instructions in preparation for your procedure:    Take only 1/2 units of insulin the night before your procedure. Do not take any insulin on the day of the procedure.  Do not take Diabetes Med Glucophage (Metformin) on the day of the procedure and HOLD 48 HOURS AFTER THE PROCEDURE.  On the morning of your procedure, take your Aspirin and any morning medicines NOT listed above.  You may use sips of water.  5. Plan for one night stay--bring personal belongings. 6. Bring a current list of your medications and current insurance cards. 7. You MUST have a responsible person to drive you home. 8. Someone MUST be with you the first 24 hours after you arrive home or your discharge will be delayed. 9. Please wear clothes that are easy to get on and off and wear slip-on shoes.  Thank you for allowing [place] to care for you!   -- [facility]  Dr. Allyson Sabal has recommended that you have an Ultrasound of your AORTA/IVC/ILIACS.   To prepare for this test:  No food after 11PM the night before.  Water is OK. (Don't drink liquids if you have been instructed not to for ANOTHER test).  Avoid foods that produce bowel gas, for 24 hours prior to exam (see below). No breakfast, no chewing gum, no smoking or carbonated beverages. Patient may take morning medications with water. Come in for test at least 15 minutes early to register.   Your physician has requested that you have a lower or upper extremity arterial duplex. This test is an ultrasound of the arteries in the legs or arms. It looks at arterial blood flow in the legs and arms. Allow one hour for Lower and Upper Arterial scans. There are no restrictions or special instructions  Your physician has requested that you have an ankle brachial index (ABI). During this test an ultrasound and blood pressure cuff are used to evaluate the arteries that supply the arms and legs with blood. Allow thirty minutes for this exam. There are no restrictions or special instructions.  These procedures will need to be done 1 week after your procedure. These procedures will be done at [address] [facility].  You will need a follow up office visit with Dr. Allyson Sabal, 2-3 weeks after your procedure.

## 2021-01-27 NOTE — Assessment & Plan Note (Signed)
Mr. Castilla was referred by Dr. Bjorn Pippin for evaluation of symptomatic PAD.  He has a history of ongoing tobacco abuse, treated hypertension, diabetes and hyperlipidemia.  He has had claudication left greater than right for last 3 to 4 years.  Recent Doppler studies performed 12/21/2020 revealed a right ABI of 0.59 and a left of 0.72.  He did have a high-frequency signal in his right common iliac artery and left common femoral artery.  He wishes to proceed with outpatient peripheral angiography and endovascular therapy for lifestyle limiting claudication.

## 2021-01-28 LAB — BASIC METABOLIC PANEL
BUN/Creatinine Ratio: 18 (ref 10–24)
BUN: 13 mg/dL (ref 8–27)
CO2: 21 mmol/L (ref 20–29)
Calcium: 9.8 mg/dL (ref 8.6–10.2)
Chloride: 96 mmol/L (ref 96–106)
Creatinine, Ser: 0.71 mg/dL — ABNORMAL LOW (ref 0.76–1.27)
Glucose: 87 mg/dL (ref 70–99)
Potassium: 4.7 mmol/L (ref 3.5–5.2)
Sodium: 135 mmol/L (ref 134–144)
eGFR: 97 mL/min/{1.73_m2} (ref >59)

## 2021-01-28 LAB — CBC
Hematocrit: 41.2 % (ref 37.5–51.0)
Hemoglobin: 14.5 g/dL (ref 13.0–17.7)
MCH: 28.1 pg (ref 26.6–33.0)
MCHC: 35.2 g/dL (ref 31.5–35.7)
MCV: 80 fL (ref 79–97)
Platelets: 316 10*3/uL (ref 150–450)
RBC: 5.16 x10E6/uL (ref 4.14–5.80)
RDW: 14.3 % (ref 11.6–15.4)
WBC: 11.7 10*3/uL — ABNORMAL HIGH (ref 3.4–10.8)

## 2021-02-03 ENCOUNTER — Other Ambulatory Visit: Payer: Self-pay

## 2021-02-03 DIAGNOSIS — I739 Peripheral vascular disease, unspecified: Secondary | ICD-10-CM

## 2021-02-03 MED ORDER — SODIUM CHLORIDE 0.9% FLUSH: 3.0000 mL | Freq: Two times a day (BID) | INTRAVENOUS | Status: DC

## 2021-02-06 ENCOUNTER — Ambulatory Visit (HOSPITAL_COMMUNITY)
Admission: RE | Admit: 2021-02-06 | Discharge: 2021-02-06 | Disposition: A | Discharge status: Home / Self Care | Payer: Medicare HMO | Source: Ambulatory Visit | Attending: Cardiovascular Disease | Admitting: Cardiovascular Disease

## 2021-02-06 ENCOUNTER — Other Ambulatory Visit: Payer: Self-pay

## 2021-02-06 ENCOUNTER — Ambulatory Visit (HOSPITAL_COMMUNITY)
Admission: RE | Disposition: A | Discharge status: Home / Self Care | Payer: Self-pay | Source: Ambulatory Visit | Attending: Cardiovascular Disease

## 2021-02-06 ENCOUNTER — Encounter (HOSPITAL_COMMUNITY): Payer: Self-pay | Admitting: Cardiovascular Disease

## 2021-02-06 DIAGNOSIS — E785 Hyperlipidemia, unspecified: Secondary | ICD-10-CM | POA: Insufficient documentation

## 2021-02-06 DIAGNOSIS — Z794 Long term (current) use of insulin: Secondary | ICD-10-CM | POA: Diagnosis not present

## 2021-02-06 DIAGNOSIS — Z79899 Other long term (current) drug therapy: Secondary | ICD-10-CM | POA: Insufficient documentation

## 2021-02-06 DIAGNOSIS — I739 Peripheral vascular disease, unspecified: Secondary | ICD-10-CM | POA: Diagnosis not present

## 2021-02-06 DIAGNOSIS — I70211 Atherosclerosis of native arteries of extremities with intermittent claudication, right leg: Secondary | ICD-10-CM | POA: Diagnosis not present

## 2021-02-06 DIAGNOSIS — F17210 Nicotine dependence, cigarettes, uncomplicated: Secondary | ICD-10-CM | POA: Diagnosis not present

## 2021-02-06 DIAGNOSIS — E1151 Type 2 diabetes mellitus with diabetic peripheral angiopathy without gangrene: Secondary | ICD-10-CM | POA: Diagnosis present

## 2021-02-06 DIAGNOSIS — I708 Atherosclerosis of other arteries: Secondary | ICD-10-CM | POA: Diagnosis not present

## 2021-02-06 DIAGNOSIS — Z91040 Latex allergy status: Secondary | ICD-10-CM | POA: Insufficient documentation

## 2021-02-06 DIAGNOSIS — I10 Essential (primary) hypertension: Secondary | ICD-10-CM | POA: Insufficient documentation

## 2021-02-06 DIAGNOSIS — Z885 Allergy status to narcotic agent status: Secondary | ICD-10-CM | POA: Diagnosis not present

## 2021-02-06 DIAGNOSIS — Z8249 Family history of ischemic heart disease and other diseases of the circulatory system: Secondary | ICD-10-CM | POA: Diagnosis not present

## 2021-02-06 DIAGNOSIS — Z791 Long term (current) use of non-steroidal anti-inflammatories (NSAID): Secondary | ICD-10-CM | POA: Insufficient documentation

## 2021-02-06 HISTORY — PX: ABDOMINAL AORTOGRAM W/LOWER EXTREMITY: CATH118223

## 2021-02-06 LAB — GLUCOSE, CAPILLARY: Glucose-Capillary: 146 mg/dL — ABNORMAL HIGH (ref 70–99)

## 2021-02-06 SURGERY — ABDOMINAL AORTOGRAM W/LOWER EXTREMITY
Anesthesia: LOCAL

## 2021-02-06 MED ORDER — SODIUM CHLORIDE 0.9 % IV SOLN: 250.0000 mL | INTRAVENOUS | Status: DC | PRN

## 2021-02-06 MED ORDER — SODIUM CHLORIDE 0.9 % IV SOLN: INTRAVENOUS | Status: DC

## 2021-02-06 MED ORDER — IODIXANOL 320 MG/ML IV SOLN
INTRAVENOUS | Status: DC | PRN
  Administered 2021-02-06: 15 mL via INTRA_ARTERIAL

## 2021-02-06 MED ORDER — FENTANYL CITRATE (PF) 100 MCG/2ML IJ SOLN
INTRAMUSCULAR | Status: DC | PRN
  Administered 2021-02-06: 25 ug via INTRAVENOUS

## 2021-02-06 MED ORDER — MIDAZOLAM HCL 2 MG/2ML IJ SOLN
INTRAMUSCULAR | Status: DC | PRN
  Administered 2021-02-06: 1 mg via INTRAVENOUS

## 2021-02-06 MED ORDER — ATORVASTATIN CALCIUM 80 MG PO TABS
80.0000 mg | ORAL_TABLET | Freq: Every day | ORAL | Status: DC
  Administered 2021-02-06: 80 mg via ORAL
  Filled 2021-02-06: qty 1

## 2021-02-06 MED ORDER — ASPIRIN 81 MG PO CHEW: 81.0000 mg | CHEWABLE_TABLET | ORAL | Status: DC

## 2021-02-06 MED ORDER — MIDAZOLAM HCL 2 MG/2ML IJ SOLN
INTRAMUSCULAR | Status: AC
  Filled 2021-02-06: qty 2

## 2021-02-06 MED ORDER — FENTANYL CITRATE (PF) 100 MCG/2ML IJ SOLN
INTRAMUSCULAR | Status: AC
  Filled 2021-02-06: qty 2

## 2021-02-06 MED ORDER — LIDOCAINE HCL (PF) 1 % IJ SOLN
INTRAMUSCULAR | Status: DC | PRN
  Administered 2021-02-06: 20 mL via SUBCUTANEOUS

## 2021-02-06 MED ORDER — ASPIRIN EC 81 MG PO TBEC
81.0000 mg | DELAYED_RELEASE_TABLET | Freq: Every day | ORAL | Status: DC
  Administered 2021-02-06: 81 mg via ORAL
  Filled 2021-02-06: qty 1

## 2021-02-06 MED ORDER — SODIUM CHLORIDE 0.9% FLUSH: 3.0000 mL | INTRAVENOUS | Status: DC | PRN

## 2021-02-06 MED ORDER — LABETALOL HCL 5 MG/ML IV SOLN: 10.0000 mg | INTRAVENOUS | Status: DC | PRN

## 2021-02-06 MED ORDER — SODIUM CHLORIDE 0.9 % WEIGHT BASED INFUSION
3.0000 mL/kg/h | INTRAVENOUS | Status: AC
  Administered 2021-02-06: 3 mL/kg/h via INTRAVENOUS

## 2021-02-06 MED ORDER — HYDRALAZINE HCL 20 MG/ML IJ SOLN
5.0000 mg | INTRAMUSCULAR | Status: DC | PRN
Start: 2021-02-06 — End: 2021-02-06

## 2021-02-06 MED ORDER — ACETAMINOPHEN 325 MG PO TABS: 650.0000 mg | ORAL_TABLET | ORAL | Status: DC | PRN

## 2021-02-06 MED ORDER — SODIUM CHLORIDE 0.9 % WEIGHT BASED INFUSION: 1.0000 mL/kg/h | INTRAVENOUS | Status: DC

## 2021-02-06 MED ORDER — SODIUM CHLORIDE 0.9% FLUSH: 3.0000 mL | Freq: Two times a day (BID) | INTRAVENOUS | Status: DC

## 2021-02-06 MED ORDER — HEPARIN (PORCINE) IN NACL 1000-0.9 UT/500ML-% IV SOLN
INTRAVENOUS | Status: AC
  Filled 2021-02-06: qty 1000

## 2021-02-06 MED ORDER — ONDANSETRON HCL 4 MG/2ML IJ SOLN: 4.0000 mg | Freq: Four times a day (QID) | INTRAMUSCULAR | Status: DC | PRN

## 2021-02-06 MED ORDER — LIDOCAINE HCL (PF) 1 % IJ SOLN
INTRAMUSCULAR | Status: AC
  Filled 2021-02-06: qty 30

## 2021-02-06 SURGICAL SUPPLY — 11 items
CATH ANGIO 5F BER2 100CM (CATHETERS) ×2 IMPLANT
CATH ANGIO 5F BER2 65CM (CATHETERS) ×2 IMPLANT
CATH ANGIO 5F PIGTAIL 65CM (CATHETERS) ×2 IMPLANT
KIT PV (KITS) ×2 IMPLANT
SHEATH PINNACLE 5F 10CM (SHEATH) ×2 IMPLANT
SHEATH PROBE COVER 6X72 (BAG) ×2 IMPLANT
SYR MEDRAD MARK 7 150ML (SYRINGE) ×2 IMPLANT
TRANSDUCER W/STOPCOCK (MISCELLANEOUS) ×2 IMPLANT
TRAY PV CATH (CUSTOM PROCEDURE TRAY) ×2 IMPLANT
TUBING CIL FLEX 10 FLL-RA (TUBING) ×2 IMPLANT
WIRE HITORQ VERSACORE ST 145CM (WIRE) ×2 IMPLANT

## 2021-02-06 NOTE — Interval H&P Note (Signed)
History and Physical Interval Note:  02/06/2021 7:38 AM  Mardi Mainland  has presented today for surgery, with the diagnosis of pad.  The various methods of treatment have been discussed with the patient and family. After consideration of risks, benefits and other options for treatment, the patient has consented to  Procedure(s): ABDOMINAL AORTOGRAM W/LOWER EXTREMITY (N/A) as a surgical intervention.  The patient's history has been reviewed, patient examined, no change in status, stable for surgery.  I have reviewed the patient's chart and labs.  Questions were answered to the patient's satisfaction.     Nanetta Batty

## 2021-02-08 MED FILL — Heparin Sod (Porcine)-NaCl IV Soln 1000 Unit/500ML-0.9%: INTRAVENOUS | Qty: 1000 | Status: AC

## 2021-02-13 ENCOUNTER — Encounter (HOSPITAL_COMMUNITY): Payer: Medicare HMO

## 2021-02-16 ENCOUNTER — Telehealth: Payer: Self-pay | Admitting: Cardiology

## 2021-02-16 MED ORDER — CARVEDILOL 12.5 MG PO TABS: 12.5000 mg | ORAL_TABLET | Freq: Two times a day (BID) | ORAL | 3 refills | Status: DC

## 2021-02-16 NOTE — Telephone Encounter (Signed)
Medication sent to the pharmacy.

## 2021-02-16 NOTE — Telephone Encounter (Signed)
*  STAT* If patient is at the pharmacy, call can be transferred to refill team.   1. Which medications need to be refilled? (please list name of each medication and dose if known) carvedilol (COREG) 12.5 MG tablet  2. Which pharmacy/location (including street and city if local pharmacy) is medication to be sent to? [id number] [place], [place] - [address]  3. Do they need a 30 day or 90 day supply?  90 day    Only has a few days worth left.

## 2021-02-20 ENCOUNTER — Encounter (HOSPITAL_COMMUNITY): Payer: Medicare HMO

## 2021-02-20 ENCOUNTER — Encounter (HOSPITAL_COMMUNITY): Payer: Self-pay | Admitting: Cardiovascular Disease

## 2021-02-22 ENCOUNTER — Ambulatory Visit: Payer: Medicare HMO | Admitting: Cardiovascular Disease

## 2021-02-22 ENCOUNTER — Encounter: Payer: Self-pay | Admitting: Cardiovascular Disease

## 2021-02-22 ENCOUNTER — Other Ambulatory Visit: Payer: Self-pay

## 2021-02-22 VITALS — BP 134/62 | HR 73 | Ht 68.0 in | Wt 182.6 lb

## 2021-02-22 DIAGNOSIS — I739 Peripheral vascular disease, unspecified: Secondary | ICD-10-CM

## 2021-02-22 DIAGNOSIS — Z01818 Encounter for other preprocedural examination: Secondary | ICD-10-CM | POA: Diagnosis not present

## 2021-02-22 NOTE — Patient Instructions (Addendum)
Medication Instructions:  Your physician recommends that you continue on your current medications as directed. Please refer to the Current Medication list given to you today.  *If you need a refill on your cardiac medications before your next appointment, please call your pharmacy*   Testing/Procedures: Your physician has requested that you have a lexiscan myoview. For further information please visit [email or web address] Please follow instruction sheet, as given. This will take place at [address] [facility], [address]  How to prepare for your Myocardial Perfusion Test: Do not eat or drink 3 hours prior to your test, except you may have water. Do not consume products containing caffeine (regular or decaffeinated) 12 hours prior to your test. (ex: coffee, chocolate, sodas, tea). Do bring a list of your current medications with you.  If not listed below, you may take your medications as normal. Do wear comfortable clothes (no dresses or overalls) and walking shoes, tennis shoes preferred (No heels or open toe shoes are allowed). Do NOT wear cologne, perfume, aftershave, or lotions (deodorant is allowed). The test will take approximately 3 to 4 hours to complete If these instructions are not followed, your test will have to be rescheduled.    Follow-Up: At [facility], you and your health needs are our priority.  As part of our continuing mission to provide you with exceptional heart care, we have created designated Provider Care Teams.  These Care Teams include your primary Cardiologist (physician) and Advanced Practice Providers (APPs -  Physician Assistants and Nurse Practitioners) who all work together to provide you with the care you need, when you need it.  We recommend signing up for the patient portal called "MyChart".  Sign up information is provided on this After Visit Summary.  MyChart is used to connect with patients for Virtual Visits (Telemedicine).  Patients are able to view  lab/test results, encounter notes, upcoming appointments, etc.  Non-urgent messages can be sent to your provider as well.   To learn more about what you can do with MyChart, go to [email or web address].    Your next appointment:   3 month(s)  The format for your next appointment:   In Person  Provider:   Nanetta Batty, MD

## 2021-02-22 NOTE — Progress Notes (Signed)
Todd Krause returns here for follow-up.  He had a peripheral angiogram which I performed 02/06/2021 revealing a calcified occluded right common iliac artery and high-grade exophytic 99% subocclusive patent plaque in his right common femoral artery.  His left common femoral artery was fluoroscopically calcified.  He is a surgical candidate and is symptomatic.  I am referring him to Dr. Clotilde Dieter for surgical evaluation.   [name], M.D., FACP, [facility], [name] [address] [place], [place]  [address]  [phone] 02/22/2021 3:11 PM

## 2021-03-01 ENCOUNTER — Other Ambulatory Visit: Payer: Self-pay | Admitting: Cardiology

## 2021-03-01 ENCOUNTER — Telehealth (HOSPITAL_COMMUNITY): Payer: Self-pay | Admitting: *Deleted

## 2021-03-01 DIAGNOSIS — I10 Essential (primary) hypertension: Secondary | ICD-10-CM

## 2021-03-01 NOTE — Telephone Encounter (Signed)
Close encounter 

## 2021-03-02 ENCOUNTER — Other Ambulatory Visit: Payer: Self-pay

## 2021-03-02 ENCOUNTER — Ambulatory Visit (HOSPITAL_COMMUNITY)
Admission: RE | Admit: 2021-03-02 | Discharge: 2021-03-02 | Disposition: A | Discharge status: Home / Self Care | Payer: Medicare HMO | Source: Ambulatory Visit | Attending: Cardiovascular Disease | Admitting: Cardiovascular Disease

## 2021-03-02 DIAGNOSIS — Z7982 Long term (current) use of aspirin: Secondary | ICD-10-CM | POA: Insufficient documentation

## 2021-03-02 DIAGNOSIS — F17200 Nicotine dependence, unspecified, uncomplicated: Secondary | ICD-10-CM | POA: Diagnosis not present

## 2021-03-02 DIAGNOSIS — Z01818 Encounter for other preprocedural examination: Secondary | ICD-10-CM | POA: Diagnosis not present

## 2021-03-02 DIAGNOSIS — Z79899 Other long term (current) drug therapy: Secondary | ICD-10-CM | POA: Insufficient documentation

## 2021-03-02 DIAGNOSIS — I739 Peripheral vascular disease, unspecified: Secondary | ICD-10-CM | POA: Diagnosis not present

## 2021-03-02 DIAGNOSIS — Z8249 Family history of ischemic heart disease and other diseases of the circulatory system: Secondary | ICD-10-CM | POA: Insufficient documentation

## 2021-03-02 DIAGNOSIS — E119 Type 2 diabetes mellitus without complications: Secondary | ICD-10-CM | POA: Diagnosis not present

## 2021-03-02 DIAGNOSIS — Z7984 Long term (current) use of oral hypoglycemic drugs: Secondary | ICD-10-CM | POA: Insufficient documentation

## 2021-03-02 DIAGNOSIS — I2510 Atherosclerotic heart disease of native coronary artery without angina pectoris: Secondary | ICD-10-CM | POA: Insufficient documentation

## 2021-03-02 LAB — MYOCARDIAL PERFUSION IMAGING
LV dias vol: 137 mL (ref 62–150)
LV sys vol: 71 mL
Nuc Stress EF: 48 %
Peak HR: 85 {beats}/min
Rest HR: 48 {beats}/min
Rest Nuclear Isotope Dose: 11 mCi
SDS: 1
SRS: 6
SSS: 7
ST Depression (mm): 0 mm
Stress Nuclear Isotope Dose: 31.8 mCi
TID: 0.97

## 2021-03-02 MED ORDER — TECHNETIUM TC 99M TETROFOSMIN IV KIT
11.0000 | PACK | Freq: Once | INTRAVENOUS | Status: AC | PRN
  Administered 2021-03-02: 11 via INTRAVENOUS
  Filled 2021-03-02: qty 11

## 2021-03-02 MED ORDER — REGADENOSON 0.4 MG/5ML IV SOLN
0.4000 mg | Freq: Once | INTRAVENOUS | Status: AC
  Administered 2021-03-02: 0.4 mg via INTRAVENOUS

## 2021-03-02 MED ORDER — TECHNETIUM TC 99M TETROFOSMIN IV KIT
31.8000 | PACK | Freq: Once | INTRAVENOUS | Status: AC | PRN
  Administered 2021-03-02: 31.8 via INTRAVENOUS
  Filled 2021-03-02: qty 32

## 2021-03-14 ENCOUNTER — Other Ambulatory Visit: Payer: Self-pay

## 2021-03-14 ENCOUNTER — Ambulatory Visit: Payer: Medicare HMO | Admitting: Vascular Surgery

## 2021-03-14 ENCOUNTER — Encounter: Payer: Self-pay | Admitting: Vascular Surgery

## 2021-03-14 VITALS — BP 135/68 | HR 76 | Temp 98.0°F | Resp 18 | Ht 68.0 in | Wt 182.3 lb

## 2021-03-14 DIAGNOSIS — I739 Peripheral vascular disease, unspecified: Secondary | ICD-10-CM | POA: Diagnosis not present

## 2021-03-14 NOTE — Progress Notes (Signed)
Patient name: Todd Krause MRN: [id number] DOB: 17-Aug-1950 Sex: male  REASON FOR CONSULT: Evaluate for aortobifemoral bypass with bilateral lower extremity claudication  HPI: Todd Krause is a 70 y.o. male, with history of hypertension, hyperlipidemia, diabetes, tobacco abuse that presents as a referral from Dr. Gwenlyn Found for evaluation of bilateral lower extremity claudication.  Patient states he has had significant symptoms over the last 1.5 to 2 years.  States it has gotten much worse and he now can only walk about 25 yards before he has to stop and gets cramping in both calves.  Really unable to get through the grocery store.  He underwent angiogram with Dr. Gwenlyn Found in the Cath Lab on 02/06/2021 through right transfemoral access.  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  Patient has smoked up until about 2 months ago.  He denies any previous abdominal surgery.  No evidence of rest pain or tissue loss.  Past Medical History:  Diagnosis Date   Arthritis    hands, knees   BPH (benign prostatic hypertrophy)    Chronic bronchitis (HCC)    ED (erectile dysfunction)    Elevated PSA    GERD (gastroesophageal reflux disease)    History of colon polyps    Hyperlipidemia    Hypertension    Lower urinary tract symptoms (LUTS)    OSA (obstructive sleep apnea)    mild to moderate osa per study 02-18-2004--  no cpap   Prostate nodule    Type 2 diabetes mellitus ([place])     Past Surgical History:  Procedure Laterality Date   ABDOMINAL AORTOGRAM W/LOWER EXTREMITY N/A 02/06/2021   Procedure: ABDOMINAL AORTOGRAM W/LOWER EXTREMITY;  Surgeon: Lorretta Harp, MD;  Location: [facility];  Service: Cardiovascular;  Laterality: N/A;   CARDIAC CATHETERIZATION  11-21-2009  dr Roderic Palau berry   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,  50% mCFX,  mild pulmonary HTN/  preserved LV , ef >60%   COLONOSCOPY  last one -- april  2016   EYE SURGERY Bilateral    iol lens for cataracts   melanoma removed from back  15 yrs ago   PROSTATE BIOPSY N/A 10/01/2014   Procedure: BIOPSY TRANSRECTAL ULTRASONIC PROSTATE (TUBP);  Surgeon: Carolan Clines, MD;  Location: [facility];  Service: Urology;  Laterality: N/A;   TRANSTHORACIC ECHOCARDIOGRAM  11-20-2009   grade I diastolic dysfunction/  ef 60-65%/  mild AR, MR, and TR/  trivial PR   TRANSURETHRAL RESECTION OF PROSTATE N/A 05/09/2015   Procedure: CYSTOSCOPY TRANSURETHRAL RESECTION OF THE PROSTATE (TURP);  Surgeon: Carolan Clines, MD;  Location: [facility];  Service: Urology;  Laterality: N/A;    Family History  Problem Relation Age of Onset   Diabetes Mother    Cancer Mother    Diabetes Father    Heart attack Father    Colon cancer Neg Hx    Colon polyps Neg Hx    Kidney disease Neg Hx    Gallbladder disease Neg Hx    Esophageal cancer Neg Hx     SOCIAL HISTORY: Social History   Socioeconomic History   Marital status: Married    Spouse name: Not on file   Number of children: 2   Years of education: Not on file   Highest education level: Not on file  Occupational History   Occupation: Psychologist, sport and exercise (cattle)  Tobacco Use   Smoking status: Every Day  Packs/day: 1.00    Years: 30.00    Pack years: 30.00    Types: Cigarettes    Last attempt to quit: 09/23/2006    Years since quitting: 14.4   Smokeless tobacco: Never  Substance and Sexual Activity   Alcohol use: No    Alcohol/week: 0.0 standard drinks   Drug use: No   Sexual activity: Never  Other Topics Concern   Not on file  Social History Narrative   No health insurance   Social Determinants of Health   Financial Resource Strain: Not on file  Food Insecurity: Not on file  Transportation Needs: Not on file  Physical Activity: Not on file  Stress: Not on file  Social Connections: Not on file  Intimate Partner Violence: Not on file    Allergies  Allergen Reactions   Latex Hives    Tramadol Rash   Codeine Other (See Comments)    constipation    Current Outpatient Medications  Medication Sig Dispense Refill   acetaminophen (TYLENOL) 500 MG tablet Take 1,000 mg by mouth every 6 (six) hours as needed for moderate pain.  (Patient not taking: Reported on 02/22/2021)     albuterol (PROVENTIL) (2.5 MG/3ML) 0.083% nebulizer solution Take 2.5 mg by nebulization every 6 (six) hours as needed for wheezing or shortness of breath.  (Patient not taking: Reported on 02/22/2021)     Alcohol Swabs (ALCOHOL WIPES) 70 % PADS Patient checks blood sugars bid     amLODipine (NORVASC) 10 MG tablet Take 1 tablet by mouth daily.     Aspirin-Acetaminophen-Caffeine (GOODY HEADACHE PO) Take 1 each by mouth daily as needed (pain).     atorvastatin (LIPITOR) 20 MG tablet Take 20 mg by mouth 3 (three) times a week.     Blood Glucose Monitoring Suppl (ACCU-CHEK GUIDE) w/Device KIT Use as directed by physician to monitor blood sugars     carvedilol (COREG) 12.5 MG tablet Take 1 tablet (12.5 mg total) by mouth 2 (two) times daily. 180 tablet 3   celecoxib (CELEBREX) 200 MG capsule Take 1 capsule by mouth 2 (two) times daily.     Cholecalciferol (VITAMIN D3) 2000 UNITS TABS Take 1 capsule by mouth every evening.      finasteride (PROSCAR) 5 MG tablet Take 1 tablet (5 mg total) by mouth daily. (Patient taking differently: Take 5 mg by mouth every evening.) 30 tablet 1   finasteride (PROSCAR) 5 MG tablet Take 1 tablet by mouth daily.     insulin isophane & regular human KwikPen (NOVOLIN 70/30 KWIKPEN) (70-30) 100 UNIT/ML KwikPen INJECT 40-60 UNITS 30 MIN BEFORE BREAKFAST, INJECT 20-40 UNITS 30 MIN BEFORE EVENING MEAL BASED ON TITRATION GUIDELINES **Maximum daily dose 100 units**     metFORMIN (GLUCOPHAGE) 1000 MG tablet Take 1,000 mg by mouth 2 (two) times daily.     Multiple Vitamin (MULTIVITAMIN) tablet Take 1 tablet by mouth every evening.      naproxen sodium (ANAPROX) 220 MG tablet Take 440 mg by  mouth every 12 (twelve) hours as needed (pain). (Patient not taking: Reported on 02/22/2021)     omeprazole (PRILOSEC) 20 MG capsule Take 1 capsule (20 mg total) by mouth daily. (Patient taking differently: Take 20 mg by mouth 2 (two) times daily.) 90 capsule 0   Red Yeast Rice 600 MG CAPS Take 600 mg by mouth every evening.      spironolactone (ALDACTONE) 25 MG tablet Take 1 tablet (25 mg total) by mouth daily. 90 tablet 3   tamsulosin (  FLOMAX) 0.4 MG CAPS capsule Take 1 capsule by mouth 2 (two) times daily.     valsartan-hydrochlorothiazide (DIOVAN-HCT) 320-25 MG tablet Take 1 tablet by mouth daily.     vitamin B-12 (CYANOCOBALAMIN) 1000 MCG tablet Take 1,000 mcg by mouth every evening.      Current Facility-Administered Medications  Medication Dose Route Frequency Provider Last Rate Last Admin   sodium chloride flush (NS) 0.9 % injection 3 mL  3 mL Intravenous Q12H Lorretta Harp, MD        REVIEW OF SYSTEMS:  $RemoveB'[X]'[id number]$  denotes positive finding, $RemoveBeforeDEI'[ ]'jBlKRFWfervxxKXq$  denotes negative finding Cardiac  Comments:  Chest pain or chest pressure:    Shortness of breath upon exertion:    Short of breath when lying flat:    Irregular heart rhythm:        Vascular    Pain in calf, thigh, or hip brought on by ambulation: x   Pain in feet at night that wakes you up from your sleep:     Blood clot in your veins:    Leg swelling:         Pulmonary    Oxygen at home:    Productive cough:     Wheezing:         Neurologic    Sudden weakness in arms or legs:     Sudden numbness in arms or legs:     Sudden onset of difficulty speaking or slurred speech:    Temporary loss of vision in one eye:     Problems with dizziness:         Gastrointestinal    Blood in stool:     Vomited blood:         Genitourinary    Burning when urinating:     Blood in urine:        Psychiatric    Major depression:         Hematologic    Bleeding problems:    Problems with blood clotting too easily:        Skin    Rashes  or ulcers:        Constitutional    Fever or chills:      PHYSICAL EXAM: Vitals:   03/14/21 1231  BP: 135/68  Pulse: 76  Resp: 18  Temp: 98 F (36.7 C)  TempSrc: Temporal  SpO2: 94%  Weight: 182 lb 4.8 oz (82.7 kg)  Height: $Remove'5\' 8"'yypzQZq$  (1.727 m)    GENERAL: The patient is a well-nourished male, in no acute distress. The vital signs are documented above. CARDIAC: There is a regular rate and rhythm.  VASCULAR:  No palpable right femoral pulse 1+ palpable left femoral pulse No palpable right pedal pulses 1+ left DP pulse PULMONARY: No respiratory distress. ABDOMEN: Soft and non-tender. MUSCULOSKELETAL: There are no major deformities or cyanosis. NEUROLOGIC: No focal weakness or paresthesias are detected. SKIN: There are no ulcers or rashes noted. PSYCHIATRIC: The patient has a normal affect.  DATA:   ABIs 12/21/2020 are 0.59 on the right monophasic and 0.72 on the left multiphasic  Assessment/Plan:  70 year old male presents for evaluation of bilateral lower extremity lifestyle limiting claudication.  He was referred by Dr. Gwenlyn Found and underwent right transfemoral access for angiogram on 02/06/2021 and found to have an occluded right iliac artery.  His left common femoral artery was also felt to be heavily diseased.  He presents today for evaluation of possible aortobifemoral bypass.  I discussed next steps would be  CTA abdomen pelvis with runoff to evaluate his anatomy and see what his best options are for revascularization moving forward.  I will have him follow-up with me after the CTA is complete in the next 2 to 3 weeks.  He has undergone Lexiscan stress test on 03/02/2021 that was low risk with no ischemia.  I did briefly outline the steps of aortobifemoral bypass but again discussed we would make that decision pending the results of the CTA.   Todd Heck, MD [facility] Office: [phone]

## 2021-03-16 ENCOUNTER — Other Ambulatory Visit: Payer: Self-pay

## 2021-03-16 DIAGNOSIS — I739 Peripheral vascular disease, unspecified: Secondary | ICD-10-CM

## 2021-03-28 ENCOUNTER — Encounter (HOSPITAL_BASED_OUTPATIENT_CLINIC_OR_DEPARTMENT_OTHER): Payer: Self-pay

## 2021-03-28 ENCOUNTER — Ambulatory Visit: Payer: Medicare HMO

## 2021-03-28 ENCOUNTER — Ambulatory Visit (HOSPITAL_BASED_OUTPATIENT_CLINIC_OR_DEPARTMENT_OTHER)
Admission: RE | Admit: 2021-03-28 | Discharge: 2021-03-28 | Disposition: A | Discharge status: Home / Self Care | Payer: Medicare HMO | Source: Ambulatory Visit | Attending: Vascular Surgery | Admitting: Vascular Surgery

## 2021-03-28 ENCOUNTER — Other Ambulatory Visit: Payer: Self-pay

## 2021-03-28 DIAGNOSIS — I739 Peripheral vascular disease, unspecified: Secondary | ICD-10-CM | POA: Diagnosis present

## 2021-03-28 LAB — POCT I-STAT CREATININE: Creatinine, Ser: 0.8 mg/dL (ref 0.61–1.24)

## 2021-03-28 MED ORDER — IOHEXOL 350 MG/ML SOLN
125.0000 mL | Freq: Once | INTRAVENOUS | Status: AC | PRN
  Administered 2021-03-28: 125 mL via INTRAVENOUS

## 2021-04-04 ENCOUNTER — Ambulatory Visit: Payer: Medicare HMO | Admitting: Vascular Surgery

## 2021-04-04 ENCOUNTER — Encounter: Payer: Self-pay | Admitting: Vascular Surgery

## 2021-04-04 ENCOUNTER — Other Ambulatory Visit: Payer: Self-pay

## 2021-04-04 VITALS — BP 152/71 | HR 76 | Temp 97.5°F | Resp 18 | Ht 68.0 in | Wt 182.0 lb

## 2021-04-04 DIAGNOSIS — I739 Peripheral vascular disease, unspecified: Secondary | ICD-10-CM | POA: Diagnosis not present

## 2021-04-04 NOTE — Progress Notes (Signed)
Patient name: Todd Krause MRN: [id number] DOB: 1950-09-16 Sex: male  REASON FOR CONSULT: Follow-up after CTA  HPI: Todd Krause is a 70 y.o. male, with history of hypertension, hyperlipidemia, diabetes, tobacco abuse that presents to discuss results of CTA.  He was referred by Dr. Allyson Sabal for evaluation of bilateral lower extremity claudication.  Patient states he has had significant symptoms over the last 1.5 to 2 years.  States it has gotten much worse and he now can only walk about 25 yards before he has to stop and gets cramping in both calves.  He also describes discomfort in both hips that radiates down his legs.  Unable to get through the grocery store.  He underwent angiogram with Dr. Allyson Sabal in the Cath Lab on 02/06/2021 through right transfemoral access.  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    Past Medical History:  Diagnosis Date   Arthritis    hands, knees   BPH (benign prostatic hypertrophy)    Chronic bronchitis (HCC)    ED (erectile dysfunction)    Elevated PSA    GERD (gastroesophageal reflux disease)    History of colon polyps    Hyperlipidemia    Hypertension    Lower urinary tract symptoms (LUTS)    OSA (obstructive sleep apnea)    mild to moderate osa per study 02-18-2004--  no cpap   Prostate nodule    Type 2 diabetes mellitus (HCC)     Past Surgical History:  Procedure Laterality Date   ABDOMINAL AORTOGRAM W/LOWER EXTREMITY N/A 02/06/2021   Procedure: ABDOMINAL AORTOGRAM W/LOWER EXTREMITY;  Surgeon: [name], MD;  Location: [facility];  Service: Cardiovascular;  Laterality: N/A;   CARDIAC CATHETERIZATION  11-21-2009  dr Christiane Ha berry   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,  50% mCFX,  mild pulmonary HTN/  preserved LV , ef >60%   COLONOSCOPY  last one -- april 2016   EYE SURGERY Bilateral    iol lens for cataracts   melanoma removed from back  15 yrs ago    PROSTATE BIOPSY N/A 10/01/2014   Procedure: BIOPSY TRANSRECTAL ULTRASONIC PROSTATE (TUBP);  Surgeon: Jethro Bolus, MD;  Location: [facility];  Service: Urology;  Laterality: N/A;   TRANSTHORACIC ECHOCARDIOGRAM  11-20-2009   grade I diastolic dysfunction/  ef 60-65%/  mild AR, MR, and TR/  trivial PR   TRANSURETHRAL RESECTION OF PROSTATE N/A 05/09/2015   Procedure: CYSTOSCOPY TRANSURETHRAL RESECTION OF THE PROSTATE (TURP);  Surgeon: Jethro Bolus, MD;  Location: [facility];  Service: Urology;  Laterality: N/A;    Family History  Problem Relation Age of Onset   Diabetes Mother    Cancer Mother    Diabetes Father    Heart attack Father    Colon cancer Neg Hx    Colon polyps Neg Hx    Kidney disease Neg Hx    Gallbladder disease Neg Hx    Esophageal cancer Neg Hx     SOCIAL HISTORY: Social History   Socioeconomic History   Marital status: Married    Spouse name: Not on file   Number of children: 2   Years of education: Not on file   Highest education level: Not on file  Occupational History   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[employer])  Tobacco Use   Smoking status: Every Day    Packs/day: 1.00    Years: 30.00    Pack years:  30.00    Types: Cigarettes    Last attempt to quit: 09/23/2006    Years since quitting: 14.5   Smokeless tobacco: Never  Substance and Sexual Activity   Alcohol use: No    Alcohol/week: 0.0 standard drinks   Drug use: No   Sexual activity: Never  Other Topics Concern   Not on file  Social History Narrative   No health insurance   Social Determinants of Health   Financial Resource Strain: Not on file  Food Insecurity: Not on file  Transportation Needs: Not on file  Physical Activity: Not on file  Stress: Not on file  Social Connections: Not on file  Intimate Partner Violence: Not on file    Allergies  Allergen Reactions   Latex Hives   Tramadol Rash   Codeine Other (See Comments)    constipation    Current Outpatient  Medications  Medication Sig Dispense Refill   acetaminophen (TYLENOL) 500 MG tablet Take 1,000 mg by mouth every 6 (six) hours as needed for moderate pain.     albuterol (PROVENTIL) (2.5 MG/3ML) 0.083% nebulizer solution Take 2.5 mg by nebulization every 6 (six) hours as needed for wheezing or shortness of breath.     Alcohol Swabs (ALCOHOL WIPES) 70 % PADS Patient checks blood sugars bid     amLODipine (NORVASC) 10 MG tablet Take 1 tablet by mouth daily.     Aspirin-Acetaminophen-Caffeine (GOODY HEADACHE PO) Take 1 each by mouth daily as needed (pain).     atorvastatin (LIPITOR) 20 MG tablet Take 20 mg by mouth 3 (three) times a week.     Blood Glucose Monitoring Suppl (ACCU-CHEK GUIDE) w/Device KIT Use as directed by physician to monitor blood sugars     carvedilol (COREG) 12.5 MG tablet Take 1 tablet (12.5 mg total) by mouth 2 (two) times daily. 180 tablet 3   celecoxib (CELEBREX) 200 MG capsule Take 1 capsule by mouth 2 (two) times daily.     Cholecalciferol (VITAMIN D3) 2000 UNITS TABS Take 1 capsule by mouth every evening.      finasteride (PROSCAR) 5 MG tablet Take 1 tablet (5 mg total) by mouth daily. (Patient taking differently: Take 5 mg by mouth every evening.) 30 tablet 1   finasteride (PROSCAR) 5 MG tablet Take 1 tablet by mouth daily.     insulin isophane & regular human KwikPen (NOVOLIN 70/30 KWIKPEN) (70-30) 100 UNIT/ML KwikPen INJECT 40-60 UNITS 30 MIN BEFORE BREAKFAST, INJECT 20-40 UNITS 30 MIN BEFORE EVENING MEAL BASED ON TITRATION GUIDELINES **Maximum daily dose 100 units**     metFORMIN (GLUCOPHAGE) 1000 MG tablet Take 1,000 mg by mouth 2 (two) times daily.     Multiple Vitamin (MULTIVITAMIN) tablet Take 1 tablet by mouth every evening.      naproxen sodium (ANAPROX) 220 MG tablet Take 440 mg by mouth every 12 (twelve) hours as needed (pain).     omeprazole (PRILOSEC) 20 MG capsule Take 1 capsule (20 mg total) by mouth daily. (Patient taking differently: Take 20 mg by mouth 2  (two) times daily.) 90 capsule 0   Red Yeast Rice 600 MG CAPS Take 600 mg by mouth every evening.      spironolactone (ALDACTONE) 25 MG tablet Take 1 tablet (25 mg total) by mouth daily. 90 tablet 3   tamsulosin (FLOMAX) 0.4 MG CAPS capsule Take 1 capsule by mouth 2 (two) times daily.     valsartan-hydrochlorothiazide (DIOVAN-HCT) 320-25 MG tablet Take 1 tablet by mouth daily.  vitamin B-12 (CYANOCOBALAMIN) 1000 MCG tablet Take 1,000 mcg by mouth every evening.      Current Facility-Administered Medications  Medication Dose Route Frequency Provider Last Rate Last Admin   sodium chloride flush (NS) 0.9 % injection 3 mL  3 mL Intravenous Q12H Lorretta Harp, MD        REVIEW OF SYSTEMS:  $RemoveB'[X]'eNVhDvhF$  denotes positive finding, $RemoveBeforeDEI'[ ]'IXicQCacFdUzuayc$  denotes negative finding Cardiac  Comments:  Chest pain or chest pressure:    Shortness of breath upon exertion:    Short of breath when lying flat:    Irregular heart rhythm:        Vascular    Pain in calf, thigh, or hip brought on by ambulation: x   Pain in feet at night that wakes you up from your sleep:     Blood clot in your veins:    Leg swelling:         Pulmonary    Oxygen at home:    Productive cough:     Wheezing:         Neurologic    Sudden weakness in arms or legs:     Sudden numbness in arms or legs:     Sudden onset of difficulty speaking or slurred speech:    Temporary loss of vision in one eye:     Problems with dizziness:         Gastrointestinal    Blood in stool:     Vomited blood:         Genitourinary    Burning when urinating:     Blood in urine:        Psychiatric    Major depression:         Hematologic    Bleeding problems:    Problems with blood clotting too easily:        Skin    Rashes or ulcers:        Constitutional    Fever or chills:      PHYSICAL EXAM: Vitals:   04/04/21 1456  BP: (!) 152/71  Pulse: 76  Resp: 18  Temp: (!) 97.5 F (36.4 C)  TempSrc: Temporal  SpO2: 95%  Weight: 182 lb (82.6  kg)  Height: $Remove'5\' 8"'TnInKbo$  (1.727 m)    GENERAL: The patient is a well-nourished male, in no acute distress. The vital signs are documented above. CARDIAC: There is a regular rate and rhythm.  VASCULAR:  No palpable right femoral pulse No palpable right pedal pulse 1+ palpable left femoral pulse 1+ left DP pulse PULMONARY: No respiratory distress. ABDOMEN: Soft and non-tender. MUSCULOSKELETAL: There are no major deformities or cyanosis. NEUROLOGIC: No focal weakness or paresthesias are detected. SKIN: There are no ulcers or rashes noted. PSYCHIATRIC: The patient has a normal affect.  DATA:   ABIs 12/21/2020 are 0.59 on the right monophasic and 0.72 on the left multiphasic  CTA today was reviewed and shows high-grade stenosis in bilateral common femoral arteries.  He also has a high-grade focal stenosis in the right common iliac artery.  Abdominal aorta is patent.  Assessment/Plan:  70 year old male presents for evaluation of bilateral lower extremity lifestyle limiting claudication and to discuss CTA results.  He was referred by Dr. Gwenlyn Found and underwent right transfemoral access for angiogram on 02/06/2021.  I have reviewed his CTA and discussed that he does not need an aortobifemoral bypass given his aorta and both iliac arteries are patent.  He does significant bilateral common femoral disease as  well as right common iliac disease.  I discussed a staged approach with initial plans for right common femoral endarterectomy with possible retrograde iliac stenting.  Discussed if he has significant improvement after intervention in the right leg we can do a left common femoral endarterectomy in the future.  Risk benefits discussed.  We will get him scheduled today.  He has already undergone cardiac clearance.   Marty Heck, MD [facility] Office: [phone]

## 2021-04-06 ENCOUNTER — Other Ambulatory Visit: Payer: Self-pay

## 2021-04-06 DIAGNOSIS — Z419 Encounter for procedure for purposes other than remedying health state, unspecified: Secondary | ICD-10-CM

## 2021-04-14 ENCOUNTER — Telehealth: Payer: Self-pay

## 2021-04-14 NOTE — Telephone Encounter (Signed)
Spoke with patient regarding date/time change for surgery to Dec 30 arriving at 0700 AM at [facility]. Pt voiced understanding.

## 2021-04-17 NOTE — Progress Notes (Signed)
Surgical Instructions    Your procedure is scheduled on 04/28/21.  Report to [facility] Main Entrance "A" at 7:25 A.M., then check in with the Admitting office.  Call this number if you have problems the morning of surgery:  [phone]   If you have any questions prior to your surgery date call [phone]: Open Monday-Friday 8am-4pm    Remember:  Do not eat or drink after midnight the night before your surgery      Take these medicines the morning of surgery with A SIP OF WATER  amLODipine (NORVASC) atorvastatin (LIPITOR) carvedilol (COREG) finasteride (PROSCAR) omeprazole (PRILOSEC) tamsulosin (FLOMAX)  IF NEEDED: acetaminophen (TYLENOL)  albuterol (PROVENTIL) nebulizer  As of today, STOP taking any Aspirin (unless otherwise instructed by your surgeon) Aleve, Naproxen, Ibuprofen, Motrin, Advil, Goody's, BC's, all herbal medications, fish oil, and all vitamins.  WHAT DO I DO ABOUT MY DIABETES MEDICATION?   Do not take oral diabetes medicines (pills) the morning of surgery.  THE NIGHT BEFORE SURGERY, take 70% (14-28 units) of insulin isophane & regular human KwikPen (NOVOLIN 70/30 KWIKPEN) .    THE MORNING OF SURGERY, do not take insulin isophane & regular human KwikPen (NOVOLIN 70/30 KWIKPEN). Do not take metFORMIN (GLUCOPHAGE).  The day of surgery, do not take other diabetes injectables, including Byetta (exenatide), Bydureon (exenatide ER), Victoza (liraglutide), or Trulicity (dulaglutide).  If your CBG is greater than 220 mg/dL, you may take  of your sliding scale (correction) dose of insulin.   HOW TO MANAGE YOUR DIABETES BEFORE AND AFTER SURGERY  Why is it important to control my blood sugar before and after surgery? Improving blood sugar levels before and after surgery helps healing and can limit problems. A way of improving blood sugar control is eating a healthy diet by:  Eating less sugar and carbohydrates  Increasing activity/exercise  Talking with  your doctor about reaching your blood sugar goals High blood sugars (greater than 180 mg/dL) can raise your risk of infections and slow your recovery, so you will need to focus on controlling your diabetes during the weeks before surgery. Make sure that the doctor who takes care of your diabetes knows about your planned surgery including the date and location.  How do I manage my blood sugar before surgery? Check your blood sugar at least 4 times a day, starting 2 days before surgery, to make sure that the level is not too high or low.  Check your blood sugar the morning of your surgery when you wake up and every 2 hours until you get to the Short Stay unit.  If your blood sugar is less than 70 mg/dL, you will need to treat for low blood sugar: Do not take insulin. Treat a low blood sugar (less than 70 mg/dL) with  cup of clear juice (cranberry or apple), 4 glucose tablets, OR glucose gel. Recheck blood sugar in 15 minutes after treatment (to make sure it is greater than 70 mg/dL). If your blood sugar is not greater than 70 mg/dL on recheck, call [phone] for further instructions. Report your blood sugar to the short stay nurse when you get to Short Stay.  If you are admitted to the hospital after surgery: Your blood sugar will be checked by the staff and you will probably be given insulin after surgery (instead of oral diabetes medicines) to make sure you have good blood sugar levels. The goal for blood sugar control after surgery is 80-180 mg/dL.    After your COVID test  You are not required to quarantine however you are required to wear a well-fitting mask when you are out and around people not in your household.  If your mask becomes wet or soiled, replace with a new one.  Wash your hands often with soap and water for 20 seconds or clean your hands with an alcohol-based hand sanitizer that contains at least 60% alcohol.  Do not share personal items.  Notify your provider: if  you are in close contact with someone who has COVID  or if you develop a fever of 100.4 or greater, sneezing, cough, sore throat, shortness of breath or body aches.             Do not wear jewelry or makeup Do not wear lotions, powders, perfumes/colognes, or deodorant. Men may shave face and neck. Do not bring valuables to the hospital. DO Not wear nail polish, gel polish, artificial nails, or any other type of covering on natural nails including finger and toenails. If patients have artificial nails, gel coating, etc. that need to be removed by a nail salon, please have this removed prior to surgery or surgery may need to be canceled/delayed if the surgeon/ anesthesia feels like the patient is unable to be adequately monitored.             [facility] is not responsible for any belongings or valuables.  Do NOT Smoke (Tobacco/Vaping)  24 hours prior to your procedure  If you use a CPAP at night, you may bring your mask for your overnight stay.   Contacts, glasses, hearing aids, dentures or partials may not be worn into surgery, please bring cases for these belongings   For patients admitted to the hospital, discharge time will be determined by your treatment team.   Patients discharged the day of surgery will not be allowed to drive home, and someone needs to stay with them for 24 hours.  NO VISITORS WILL BE ALLOWED IN PRE-OP WHERE PATIENTS ARE PREPPED FOR SURGERY.  ONLY 1 SUPPORT PERSON MAY BE PRESENT IN THE WAITING ROOM WHILE YOU ARE IN SURGERY.  IF YOU ARE TO BE ADMITTED, ONCE YOU ARE IN YOUR ROOM YOU WILL BE ALLOWED TWO (2) VISITORS. 1 (ONE) VISITOR MAY STAY OVERNIGHT BUT MUST ARRIVE TO THE ROOM BY 8pm.  Minor children may have two parents present. Special consideration for safety and communication needs will be reviewed on a case by case basis.  Special instructions:    Oral Hygiene is also important to reduce your risk of infection.  Remember - BRUSH YOUR TEETH THE MORNING OF  SURGERY WITH YOUR REGULAR TOOTHPASTE   [facility]- Preparing For Surgery  Before surgery, you can play an important role. Because skin is not sterile, your skin needs to be as free of germs as possible. You can reduce the number of germs on your skin by washing with CHG (chlorahexidine gluconate) Soap before surgery.  CHG is an antiseptic cleaner which kills germs and bonds with the skin to continue killing germs even after washing.     Please do not use if you have an allergy to CHG or antibacterial soaps. If your skin becomes reddened/irritated stop using the CHG.  Do not shave (including legs and underarms) for at least 48 hours prior to first CHG shower. It is OK to shave your face.  Please follow these instructions carefully.     Shower the NIGHT BEFORE SURGERY and the MORNING OF SURGERY with CHG Soap.   If  you chose to wash your hair, wash your hair first as usual with your normal shampoo. After you shampoo, rinse your hair and body thoroughly to remove the shampoo.  Then [employer] and genitals (private parts) with your normal soap and rinse thoroughly to remove soap.  After that Use CHG Soap as you would any other liquid soap. You can apply CHG directly to the skin and wash gently with a scrungie or a clean washcloth.   Apply the CHG Soap to your body ONLY FROM THE NECK DOWN.  Do not use on open wounds or open sores. Avoid contact with your eyes, ears, mouth and genitals (private parts). Wash Face and genitals (private parts)  with your normal soap.   Wash thoroughly, paying special attention to the area where your surgery will be performed.  Thoroughly rinse your body with warm water from the neck down.  DO NOT shower/wash with your normal soap after using and rinsing off the CHG Soap.  Pat yourself dry with a CLEAN TOWEL.  Wear CLEAN PAJAMAS to bed the night before surgery  Place CLEAN SHEETS on your bed the night before your surgery  DO NOT SLEEP WITH PETS.   Day of  Surgery: Take a shower with CHG soap. Wear Clean/Comfortable clothing the morning of surgery Do not apply any deodorants/lotions.   Remember to brush your teeth WITH YOUR REGULAR TOOTHPASTE.   Please read over the following fact sheets that you were given.

## 2021-04-18 ENCOUNTER — Encounter (HOSPITAL_COMMUNITY)
Admission: RE | Admit: 2021-04-18 | Discharge: 2021-04-18 | Disposition: A | Discharge status: Home / Self Care | Payer: Medicare HMO | Source: Ambulatory Visit | Attending: Vascular Surgery | Admitting: Vascular Surgery

## 2021-04-18 ENCOUNTER — Other Ambulatory Visit: Payer: Self-pay

## 2021-04-18 ENCOUNTER — Encounter (HOSPITAL_COMMUNITY): Payer: Self-pay

## 2021-04-18 ENCOUNTER — Ambulatory Visit (INDEPENDENT_AMBULATORY_CARE_PROVIDER_SITE_OTHER): Payer: Medicare HMO | Admitting: Pharmacist Clinician (PhC)/ Clinical Pharmacy Specialist

## 2021-04-18 VITALS — BP 121/58 | HR 70 | Temp 97.6°F | Resp 17 | Ht 68.0 in | Wt 180.6 lb

## 2021-04-18 DIAGNOSIS — I10 Essential (primary) hypertension: Secondary | ICD-10-CM

## 2021-04-18 DIAGNOSIS — Z01812 Encounter for preprocedural laboratory examination: Secondary | ICD-10-CM | POA: Insufficient documentation

## 2021-04-18 DIAGNOSIS — I70201 Unspecified atherosclerosis of native arteries of extremities, right leg: Secondary | ICD-10-CM | POA: Insufficient documentation

## 2021-04-18 DIAGNOSIS — Z7901 Long term (current) use of anticoagulants: Secondary | ICD-10-CM | POA: Diagnosis not present

## 2021-04-18 DIAGNOSIS — Z794 Long term (current) use of insulin: Secondary | ICD-10-CM | POA: Diagnosis not present

## 2021-04-18 DIAGNOSIS — I745 Embolism and thrombosis of iliac artery: Secondary | ICD-10-CM | POA: Diagnosis not present

## 2021-04-18 DIAGNOSIS — I447 Left bundle-branch block, unspecified: Secondary | ICD-10-CM | POA: Diagnosis not present

## 2021-04-18 DIAGNOSIS — Z419 Encounter for procedure for purposes other than remedying health state, unspecified: Secondary | ICD-10-CM

## 2021-04-18 DIAGNOSIS — E119 Type 2 diabetes mellitus without complications: Secondary | ICD-10-CM | POA: Insufficient documentation

## 2021-04-18 DIAGNOSIS — Z01818 Encounter for other preprocedural examination: Secondary | ICD-10-CM

## 2021-04-18 HISTORY — DX: Atherosclerotic heart disease of native coronary artery without angina pectoris: I25.10

## 2021-04-18 HISTORY — DX: Peripheral vascular disease, unspecified: I73.9

## 2021-04-18 HISTORY — DX: Personal history of other diseases of the digestive system: Z87.19

## 2021-04-18 HISTORY — DX: Inflammatory liver disease, unspecified: K75.9

## 2021-04-18 LAB — BLOOD GAS, ARTERIAL
Acid-base deficit: 4.9 mmol/L — ABNORMAL HIGH (ref 0.0–2.0)
Bicarbonate: 19.6 mmol/L — ABNORMAL LOW (ref 20.0–28.0)
Drawn by: 60286
FIO2: 21
O2 Saturation: 94.2 %
Patient temperature: 37
pCO2 arterial: 35.8 mmHg (ref 32.0–48.0)
pH, Arterial: 7.356 (ref 7.350–7.450)
pO2, Arterial: 85.4 mmHg (ref 83.0–108.0)

## 2021-04-18 LAB — PROTIME-INR
INR: 1.1 (ref 0.8–1.2)
Prothrombin Time: 13.7 seconds (ref 11.4–15.2)

## 2021-04-18 LAB — COMPREHENSIVE METABOLIC PANEL
ALT: 18 U/L (ref 0–44)
AST: 19 U/L (ref 15–41)
Albumin: 3.7 g/dL (ref 3.5–5.0)
Alkaline Phosphatase: 74 U/L (ref 38–126)
Anion gap: 9 (ref 5–15)
BUN: 22 mg/dL (ref 8–23)
CO2: 18 mmol/L — ABNORMAL LOW (ref 22–32)
Calcium: 8.9 mg/dL (ref 8.9–10.3)
Chloride: 103 mmol/L (ref 98–111)
Creatinine, Ser: 1 mg/dL (ref 0.61–1.24)
GFR, Estimated: >60 mL/min (ref >60)
Glucose, Bld: 263 mg/dL — ABNORMAL HIGH (ref 70–99)
Potassium: 4.9 mmol/L (ref 3.5–5.1)
Sodium: 130 mmol/L — ABNORMAL LOW (ref 135–145)
Total Bilirubin: 0.4 mg/dL (ref 0.3–1.2)
Total Protein: 6.8 g/dL (ref 6.5–8.1)

## 2021-04-18 LAB — CBC
HCT: 43.1 % (ref 39.0–52.0)
Hemoglobin: 14.2 g/dL (ref 13.0–17.0)
MCH: 29 pg (ref 26.0–34.0)
MCHC: 32.9 g/dL (ref 30.0–36.0)
MCV: 88.1 fL (ref 80.0–100.0)
Platelets: 283 10*3/uL (ref 150–400)
RBC: 4.89 MIL/uL (ref 4.22–5.81)
RDW: 13.6 % (ref 11.5–15.5)
WBC: 11.3 10*3/uL — ABNORMAL HIGH (ref 4.0–10.5)
nRBC: 0 % (ref 0.0–0.2)

## 2021-04-18 LAB — TYPE AND SCREEN
ABO/RH(D): O POS
Antibody Screen: NEGATIVE

## 2021-04-18 LAB — SURGICAL PCR SCREEN
MRSA, PCR: NEGATIVE
Staphylococcus aureus: NEGATIVE

## 2021-04-18 LAB — GLUCOSE, CAPILLARY: Glucose-Capillary: 282 mg/dL — ABNORMAL HIGH (ref 70–99)

## 2021-04-18 LAB — HEMOGLOBIN A1C
Hgb A1c MFr Bld: 7.4 % — ABNORMAL HIGH (ref 4.8–5.6)
Mean Plasma Glucose: 165.68 mg/dL

## 2021-04-18 LAB — APTT: aPTT: 29 seconds (ref 24–36)

## 2021-04-18 MED ORDER — CHLORHEXIDINE GLUCONATE 0.12 % MT SOLN: 15.0000 mL | Freq: Once | OROMUCOSAL | Status: DC

## 2021-04-18 MED ORDER — ORAL CARE MOUTH RINSE: 15.0000 mL | Freq: Once | OROMUCOSAL | Status: DC

## 2021-04-18 MED ORDER — LACTATED RINGERS IV SOLN: INTRAVENOUS | Status: DC

## 2021-04-18 NOTE — Progress Notes (Signed)
PCP - Elizabeth Palau Cardiologist - Nanetta Batty  PPM/ICD - denies   Chest x-ray - n/a EKG - 12/07/20 Stress Test - 03/02/21 ECHO - 12/28/20 Cardiac Cath - 2011  Sleep Study - negative for sleep apnea per patient   Fasting Blood Sugar - 110-120 Checks Blood Sugar 4x times a day  Patient instructed to hold all Aspirin, NSAID's, herbal medications, fish oil and vitamins 7 days prior to surgery.   ERAS Protcol -no PRE-SURGERY Ensure or G2- none ordered  COVID TEST- 04/26/21 scheduled   Anesthesia review: yes, cardiac history. Antionette Poles, PA-c notified  Patient denies shortness of breath, fever, cough and chest pain at PAT appointment   All instructions explained to the patient, with a verbal understanding of the material. Patient agrees to go over the instructions while at home for a better understanding. Patient also instructed to self quarantine after being tested for COVID-19. The opportunity to ask questions was provided.

## 2021-04-18 NOTE — Patient Instructions (Signed)
Thank you for choosing [facility]  Take your BP meds as follows:  Continue with all current medications  Bring all of your meds, your BP cuff and your record of home blood pressures to your next appointment.  Exercise as youre able, try to walk approximately 30 minutes per day.  Keep salt intake to a minimum, especially watch canned and prepared boxed foods.  Eat more fresh fruits and vegetables and fewer canned items.  Avoid eating in fast food restaurants.    HOW TO TAKE YOUR BLOOD PRESSURE: Rest 5 minutes before taking your blood pressure.  Dont smoke or drink caffeinated beverages for at least 30 minutes before. Take your blood pressure before (not after) you eat. Sit comfortably with your back supported and both feet on the floor (dont cross your legs). Elevate your arm to heart level on a table or a desk. Use the proper sized cuff. It should fit smoothly and snugly around your bare upper arm. There should be enough room to slip a fingertip under the cuff. The bottom edge of the cuff should be 1 inch above the crease of the elbow. Ideally, take 3 measurements at one sitting and record the average.

## 2021-04-18 NOTE — Progress Notes (Signed)
04/18/2021 HUBER MATHERS 1951-01-28 [id number]   HPI:  Todd Krause is a 70 y.o. male patient of Dr Gardiner Rhyme, who presents today for hypertension clinic evaluation.  PMH is significant for HTN, smoking, and T2DM.  He was seen by Dr. Gardiner Rhyme in August and because of elevated BP, metoprolol was switched to carvedilol 12.5 mg bid.  He did fine with this medication change and was then seen after 2 weeks by Karren Cobble PharmD.  Pressure at that appointment remained elevated, at 162/66 and spironolactone 12.5 mg was added.  At that time he noted he was driving regularly between [place] and [place], and often snacked while in the car (oatmeal cream pies, coffee).  At our last visit amlodipine was switched to evenings and spironolactone was increased to 25 mg daily.  Since then he has had a peripheral angiogram and was referred to surgery blockages in the right common iliac as well as left and right common femoral arteries.    Today he returns for follow up.    Surgery scheduled for Dec 30   Past Medical History: hyperlipidemia No recent LDL  - on atorvastatin 20   DM2 No A1c available, currently on Novolin 70/30 and metformin  PAD Right common iliac, left/right common femoral  - referred to surgery  BPH Elevated PSA - on finasteride and tamsulosin     Blood Pressure Goal:  130/80  Current Medications: amlodipine 10 mg qd, valsartan hct 320/25 mg qd, carvedilol 12.5 mg bid, spironolactone 55 mg qd    Family Hx: grandparents and parents all had hypertension; father had MI, defibrillator; son with hypertension, 2 brothers 1 sister, only 1 brother living now, all had hypertension as well  Social Hx: smokes 1 ppd; only occasional wine, drinks home brewed decaf coffee daily as well as [employer],   Diet: typically eats 2 meals per day, snacks regularly while in car (sandwiches, oatmeal cream pies), eats more home cooked meals; veggies are usually fresh   Exercise:   difficulty with walking because of venous insuvfficiency - has upcoming appointment with Dr. Gwenlyn Found  Home BP readings:  no home readings  Intolerances: no cardiac medication intolerances  Labs: 8/22:  Na 136, K 4.7, Glu 152, BUN 14, SCr 0.69, GFR 98   Wt Readings from Last 3 Encounters:  04/18/21 182 lb 6.4 oz (82.7 kg)  04/04/21 182 lb (82.6 kg)  03/14/21 182 lb 4.8 oz (82.7 kg)   BP Readings from Last 3 Encounters:  04/18/21 132/64  04/04/21 (!) 152/71  03/14/21 135/68   Pulse Readings from Last 3 Encounters:  04/18/21 88  04/04/21 76  03/14/21 76    Current Outpatient Medications  Medication Sig Dispense Refill   acetaminophen (TYLENOL) 500 MG tablet Take 1,000 mg by mouth every 6 (six) hours as needed for moderate pain.     albuterol (PROVENTIL) (2.5 MG/3ML) 0.083% nebulizer solution Take 2.5 mg by nebulization every 6 (six) hours as needed for wheezing or shortness of breath.     Alcohol Swabs (ALCOHOL WIPES) 70 % PADS Patient checks blood sugars bid     amLODipine (NORVASC) 10 MG tablet Take 1 tablet by mouth daily.     Aspirin-Acetaminophen-Caffeine (GOODY HEADACHE PO) Take 1 each by mouth daily as needed (pain).     atorvastatin (LIPITOR) 20 MG tablet Take 20 mg by mouth 3 (three) times a week.     Blood Glucose Monitoring Suppl (ACCU-CHEK GUIDE) w/Device KIT Use as directed by  physician to monitor blood sugars     carvedilol (COREG) 12.5 MG tablet Take 1 tablet (12.5 mg total) by mouth 2 (two) times daily. 180 tablet 3   celecoxib (CELEBREX) 200 MG capsule Take 1 capsule by mouth 2 (two) times daily.     Cholecalciferol (VITAMIN D3) 2000 UNITS TABS Take 1 capsule by mouth every evening.      finasteride (PROSCAR) 5 MG tablet Take 1 tablet (5 mg total) by mouth daily. (Patient taking differently: Take 5 mg by mouth every evening.) 30 tablet 1   insulin isophane & regular human KwikPen (NOVOLIN 70/30 KWIKPEN) (70-30) 100 UNIT/ML KwikPen INJECT 40-60 UNITS 30 MIN BEFORE  BREAKFAST, INJECT 20-40 UNITS 30 MIN BEFORE EVENING MEAL BASED ON TITRATION GUIDELINES **Maximum daily dose 100 units**     metFORMIN (GLUCOPHAGE) 1000 MG tablet Take 1,000 mg by mouth 2 (two) times daily.     Multiple Vitamin (MULTIVITAMIN) tablet Take 1 tablet by mouth every evening.      naproxen sodium (ANAPROX) 220 MG tablet Take 440 mg by mouth every 12 (twelve) hours as needed (pain).     omeprazole (PRILOSEC) 20 MG capsule Take 1 capsule (20 mg total) by mouth daily. (Patient taking differently: Take 20 mg by mouth 2 (two) times daily.) 90 capsule 0   Red Yeast Rice 600 MG CAPS Take 600 mg by mouth every evening.      spironolactone (ALDACTONE) 25 MG tablet Take 1 tablet (25 mg total) by mouth daily. 90 tablet 3   tamsulosin (FLOMAX) 0.4 MG CAPS capsule Take 1 capsule by mouth 2 (two) times daily.     valsartan-hydrochlorothiazide (DIOVAN-HCT) 320-25 MG tablet Take 1 tablet by mouth daily.     vitamin B-12 (CYANOCOBALAMIN) 1000 MCG tablet Take 1,000 mcg by mouth every evening.      Current Facility-Administered Medications  Medication Dose Route Frequency Provider Last Rate Last Admin   sodium chloride flush (NS) 0.9 % injection 3 mL  3 mL Intravenous Q12H Lorretta Harp, MD        Allergies  Allergen Reactions   Latex Hives   Tramadol Rash   Codeine Other (See Comments)    constipation    Past Medical History:  Diagnosis Date   Arthritis    hands, knees   BPH (benign prostatic hypertrophy)    Chronic bronchitis (HCC)    ED (erectile dysfunction)    Elevated PSA    GERD (gastroesophageal reflux disease)    History of colon polyps    Hyperlipidemia    Hypertension    Lower urinary tract symptoms (LUTS)    OSA (obstructive sleep apnea)    mild to moderate osa per study 02-18-2004--  no cpap   Prostate nodule    Type 2 diabetes mellitus (HCC)     Blood pressure 132/64, pulse 88, resp. rate 17, height $RemoveBe'5\' 8"'wDlCUpuAt$  (1.727 m), weight 182 lb 6.4 oz (82.7 kg), SpO2 91  %.  Essential hypertension Patient with essential hypertension, now doing better on combination of 5 medications.  No changes to medications at this time.  He is scheduled for surgery for PAD at the end of the month.  We can follow up with him if needed in the new year should his BP become elevated again.     Tommy Medal PharmD CPP [facility] [address] [place],  [address] [phone]

## 2021-04-18 NOTE — Progress Notes (Signed)
Pt needs urine sample DOS due to lab misplacing the urine sample obtained in PAT. Order placed and cup left in chart.

## 2021-04-18 NOTE — Assessment & Plan Note (Signed)
Patient with essential hypertension, now doing better on combination of 5 medications.  No changes to medications at this time.  He is scheduled for surgery for PAD at the end of the month.  We can follow up with him if needed in the new year should his BP become elevated again.

## 2021-04-19 NOTE — Anesthesia Preprocedure Evaluation (Addendum)
Anesthesia Evaluation  Patient identified by MRN, date of birth, ID band Patient awake    Reviewed: Allergy & Precautions, NPO status , Patient's Chart, lab work & pertinent test results, reviewed documented beta blocker date and time   History of Anesthesia Complications Negative for: history of anesthetic complications  Airway Mallampati: II  TM Distance: >3 FB Neck ROM: Full    Dental  (+) Poor Dentition, Dental Advisory Given   Pulmonary sleep apnea , Current Smoker,    Pulmonary exam normal        Cardiovascular hypertension, Pt. on medications and Pt. on home beta blockers + CAD and + Peripheral Vascular Disease  Normal cardiovascular exam     Neuro/Psych negative neurological ROS     GI/Hepatic Neg liver ROS, hiatal hernia, GERD  ,  Endo/Other  diabetes, Type 2, Insulin Dependent  Renal/GU negative Renal ROS  negative genitourinary   Musculoskeletal  (+) Arthritis ,   Abdominal   Peds  Hematology negative hematology ROS (+)   Anesthesia Other Findings   Reproductive/Obstetrics                          Anesthesia Physical Anesthesia Plan  ASA: 3  Anesthesia Plan: General   Post-op Pain Management: Tylenol PO (pre-op)   Induction: Intravenous  PONV Risk Score and Plan: 1 and Ondansetron, Dexamethasone, Treatment may vary due to age or medical condition and Midazolam  Airway Management Planned: Oral ETT  Additional Equipment:   Intra-op Plan:   Post-operative Plan: Extubation in OR  Informed Consent: I have reviewed the patients History and Physical, chart, labs and discussed the procedure including the risks, benefits and alternatives for the proposed anesthesia with the patient or authorized representative who has indicated his/her understanding and acceptance.     Dental advisory given  Plan Discussed with:   Anesthesia Plan Comments: (PAT note by Antionette Poles,  PA-C:  Recently evaluated by cardiologist Dr. Allyson Sabal for vascular claudication.  He underwent peripheral angiography on 02/06/2021.  Dr. Gery Pray subsequently commented on these results stating, "Mr. Neilan returns here for follow-up. He had a peripheral angiogram which I performed 02/06/2021 revealing a calcified occluded right common iliac artery and high-grade exophytic 99% subocclusive patent plaque in his right common femoral artery. His left common femoral artery was fluoroscopically calcified. He is a surgical candidate and is symptomatic. I am referring him to Dr. Clotilde Dieter for surgical evaluation."  Patient subsequently underwent nuclear stress test that was low risk, nonischemic.  Patient also recently had echocardiogram to evaluate left bundle branch block.  Echo 12/28/2020 showed LVEF 50 to 55%, grade 1 DD, no significant valvular abnormalities.  OSA, not on CPAP.  IDDM 2, A1c 7.4 on preop labs.  Mild hyponatremia sodium 130 (134 when corrected for glucose 263), labs otherwise unremarkable.  EKG 12/07/2020: Sinus bradycardia.  Rate 59.  Left bundle branch block.  Nuclear stress 03/02/2021: 1. Overall, suspect this is likely a normal study without evidence of ischemia or infarction. There are significant artifacts described below.  2. There are reduced counts in the inferior segments consistent with diaphragm attenuation as the wall motion is normal in this region.  3. There are reduced counts in the anterior and septum on rest imaging that improve on stress imaging. Suspect this is related to significant reduced counts in the myocardium due to gut uptake.  4. Normal LVEF for this modality, 48%.  5. This is a low-risk study.  TTE 12/28/2020: 1. Septal dyskinesis consistent with bundle branch block. Left  ventricular ejection fraction, by estimation, is 50 to 55%. The left  ventricle has low normal function. The left ventricle demonstrates  regional wall motion abnormalities (see  scoring  diagram/findings for description). Left ventricular diastolic parameters  are consistent with Grade I diastolic dysfunction (impaired relaxation).  2. Right ventricular systolic function is normal. The right ventricular  size is normal. There is normal pulmonary artery systolic pressure.  3. The mitral valve is normal in structure. No evidence of mitral valve  regurgitation. No evidence of mitral stenosis.  4. The aortic valve is tricuspid. Aortic valve regurgitation is not  visualized. No aortic stenosis is present.  )      Anesthesia Quick Evaluation

## 2021-04-19 NOTE — Progress Notes (Signed)
Anesthesia Chart Review:  Recently evaluated by cardiologist Dr. Allyson Sabal for vascular claudication.  He underwent peripheral angiography on 02/06/2021.  Dr. Gery Pray subsequently commented on these results stating, "Mr. Soderquist returns here for follow-up.  He had a peripheral angiogram which I performed 02/06/2021 revealing a calcified occluded right common iliac artery and high-grade exophytic 99% subocclusive patent plaque in his right common femoral artery.  His left common femoral artery was fluoroscopically calcified.  He is a surgical candidate and is symptomatic.  I am referring him to Dr. Clotilde Dieter for surgical evaluation."  Patient subsequently underwent nuclear stress test that was low risk, nonischemic.  Patient also recently had echocardiogram to evaluate left bundle branch block.  Echo 12/28/2020 showed LVEF 50 to 55%, grade 1 DD, no significant valvular abnormalities.  OSA, not on CPAP.  IDDM 2, A1c 7.4 on preop labs.  Mild hyponatremia sodium 130 (134 when corrected for glucose 263), labs otherwise unremarkable.  EKG 12/07/2020: Sinus bradycardia.  Rate 59.  Left bundle branch block.  Nuclear stress 03/02/2021: Overall, suspect this is likely a normal study without evidence of ischemia or infarction. There are significant artifacts described below.  There are reduced counts in the inferior segments consistent with diaphragm attenuation as the wall motion is normal in this region.  There are reduced counts in the anterior and septum on rest imaging that improve on stress imaging. Suspect this is related to significant reduced counts in the myocardium due to gut uptake.  Normal LVEF for this modality, 48%.  This is a low-risk study.   TTE 12/28/2020:  1. Septal dyskinesis consistent with bundle branch block. Left  ventricular ejection fraction, by estimation, is 50 to 55%. The left  ventricle has low normal function. The left ventricle demonstrates  regional wall motion abnormalities  (see scoring  diagram/findings for description). Left ventricular diastolic parameters  are consistent with Grade I diastolic dysfunction (impaired relaxation).   2. Right ventricular systolic function is normal. The right ventricular  size is normal. There is normal pulmonary artery systolic pressure.   3. The mitral valve is normal in structure. No evidence of mitral valve  regurgitation. No evidence of mitral stenosis.   4. The aortic valve is tricuspid. Aortic valve regurgitation is not  visualized. No aortic stenosis is present.    Zannie Cove [facility] Short Stay Center/Anesthesiology Phone [phone] 04/19/2021 2:43 PM

## 2021-04-26 ENCOUNTER — Other Ambulatory Visit (HOSPITAL_COMMUNITY)
Admission: RE | Admit: 2021-04-26 | Discharge: 2021-04-26 | Disposition: A | Discharge status: Home / Self Care | Payer: Medicare HMO | Source: Ambulatory Visit | Attending: Vascular Surgery | Admitting: Vascular Surgery

## 2021-04-26 LAB — SARS CORONAVIRUS 2 (TAT 6-24 HRS): SARS Coronavirus 2: NEGATIVE

## 2021-04-28 ENCOUNTER — Encounter (HOSPITAL_COMMUNITY): Payer: Self-pay | Admitting: Vascular Surgery

## 2021-04-28 ENCOUNTER — Inpatient Hospital Stay (HOSPITAL_COMMUNITY): Payer: Medicare HMO | Admitting: Physician Assistant

## 2021-04-28 ENCOUNTER — Inpatient Hospital Stay (HOSPITAL_COMMUNITY)
Admission: RE | Admit: 2021-04-28 | Discharge: 2021-04-30 | DRG: 271 | Disposition: A | Discharge status: Home / Self Care | Payer: Medicare HMO | Source: Ambulatory Visit | Attending: Vascular Surgery | Admitting: Vascular Surgery

## 2021-04-28 ENCOUNTER — Other Ambulatory Visit: Payer: Self-pay

## 2021-04-28 ENCOUNTER — Inpatient Hospital Stay (HOSPITAL_COMMUNITY): Payer: Medicare HMO | Admitting: Anesthesiology

## 2021-04-28 ENCOUNTER — Encounter (HOSPITAL_COMMUNITY)
Admission: RE | Disposition: A | Discharge status: Home / Self Care | Payer: Self-pay | Source: Ambulatory Visit | Attending: Vascular Surgery

## 2021-04-28 ENCOUNTER — Inpatient Hospital Stay (HOSPITAL_COMMUNITY): Payer: Medicare HMO

## 2021-04-28 DIAGNOSIS — D62 Acute posthemorrhagic anemia: Secondary | ICD-10-CM | POA: Diagnosis not present

## 2021-04-28 DIAGNOSIS — F17210 Nicotine dependence, cigarettes, uncomplicated: Secondary | ICD-10-CM | POA: Diagnosis present

## 2021-04-28 DIAGNOSIS — Z833 Family history of diabetes mellitus: Secondary | ICD-10-CM

## 2021-04-28 DIAGNOSIS — Z794 Long term (current) use of insulin: Secondary | ICD-10-CM

## 2021-04-28 DIAGNOSIS — Z9079 Acquired absence of other genital organ(s): Secondary | ICD-10-CM

## 2021-04-28 DIAGNOSIS — J42 Unspecified chronic bronchitis: Secondary | ICD-10-CM | POA: Diagnosis present

## 2021-04-28 DIAGNOSIS — E1151 Type 2 diabetes mellitus with diabetic peripheral angiopathy without gangrene: Secondary | ICD-10-CM | POA: Diagnosis present

## 2021-04-28 DIAGNOSIS — I2510 Atherosclerotic heart disease of native coronary artery without angina pectoris: Secondary | ICD-10-CM | POA: Diagnosis present

## 2021-04-28 DIAGNOSIS — Z20822 Contact with and (suspected) exposure to covid-19: Secondary | ICD-10-CM | POA: Diagnosis present

## 2021-04-28 DIAGNOSIS — N400 Enlarged prostate without lower urinary tract symptoms: Secondary | ICD-10-CM | POA: Diagnosis present

## 2021-04-28 DIAGNOSIS — E785 Hyperlipidemia, unspecified: Secondary | ICD-10-CM | POA: Diagnosis present

## 2021-04-28 DIAGNOSIS — G4733 Obstructive sleep apnea (adult) (pediatric): Secondary | ICD-10-CM | POA: Diagnosis present

## 2021-04-28 DIAGNOSIS — M19041 Primary osteoarthritis, right hand: Secondary | ICD-10-CM | POA: Diagnosis present

## 2021-04-28 DIAGNOSIS — I70211 Atherosclerosis of native arteries of extremities with intermittent claudication, right leg: Secondary | ICD-10-CM | POA: Diagnosis not present

## 2021-04-28 DIAGNOSIS — Z7982 Long term (current) use of aspirin: Secondary | ICD-10-CM | POA: Diagnosis not present

## 2021-04-28 DIAGNOSIS — M170 Bilateral primary osteoarthritis of knee: Secondary | ICD-10-CM | POA: Diagnosis present

## 2021-04-28 DIAGNOSIS — Z95820 Peripheral vascular angioplasty status with implants and grafts: Secondary | ICD-10-CM | POA: Diagnosis not present

## 2021-04-28 DIAGNOSIS — I70213 Atherosclerosis of native arteries of extremities with intermittent claudication, bilateral legs: Secondary | ICD-10-CM | POA: Diagnosis present

## 2021-04-28 DIAGNOSIS — Z91040 Latex allergy status: Secondary | ICD-10-CM

## 2021-04-28 DIAGNOSIS — I10 Essential (primary) hypertension: Secondary | ICD-10-CM | POA: Diagnosis present

## 2021-04-28 DIAGNOSIS — Z7984 Long term (current) use of oral hypoglycemic drugs: Secondary | ICD-10-CM | POA: Diagnosis not present

## 2021-04-28 DIAGNOSIS — Z79899 Other long term (current) drug therapy: Secondary | ICD-10-CM

## 2021-04-28 DIAGNOSIS — K219 Gastro-esophageal reflux disease without esophagitis: Secondary | ICD-10-CM | POA: Diagnosis present

## 2021-04-28 DIAGNOSIS — Z9889 Other specified postprocedural states: Secondary | ICD-10-CM

## 2021-04-28 DIAGNOSIS — I739 Peripheral vascular disease, unspecified: Secondary | ICD-10-CM | POA: Diagnosis present

## 2021-04-28 DIAGNOSIS — M19042 Primary osteoarthritis, left hand: Secondary | ICD-10-CM | POA: Diagnosis present

## 2021-04-28 DIAGNOSIS — Z885 Allergy status to narcotic agent status: Secondary | ICD-10-CM | POA: Diagnosis not present

## 2021-04-28 DIAGNOSIS — Z8249 Family history of ischemic heart disease and other diseases of the circulatory system: Secondary | ICD-10-CM

## 2021-04-28 DIAGNOSIS — Z01818 Encounter for other preprocedural examination: Secondary | ICD-10-CM

## 2021-04-28 DIAGNOSIS — I708 Atherosclerosis of other arteries: Secondary | ICD-10-CM | POA: Diagnosis present

## 2021-04-28 HISTORY — PX: PATCH ANGIOPLASTY: SHX6230

## 2021-04-28 HISTORY — PX: AORTOGRAM: SHX6300

## 2021-04-28 HISTORY — PX: INSERTION OF ILIAC STENT: SHX6256

## 2021-04-28 HISTORY — PX: ENDARTERECTOMY FEMORAL: SHX5804

## 2021-04-28 LAB — ABO/RH: ABO/RH(D): O POS

## 2021-04-28 LAB — GLUCOSE, CAPILLARY
Glucose-Capillary: 196 mg/dL — ABNORMAL HIGH (ref 70–99)
Glucose-Capillary: 136 mg/dL — ABNORMAL HIGH (ref 70–99)
Glucose-Capillary: 244 mg/dL — ABNORMAL HIGH (ref 70–99)
Glucose-Capillary: 250 mg/dL — ABNORMAL HIGH (ref 70–99)

## 2021-04-28 LAB — CBC
HCT: 38.4 % — ABNORMAL LOW (ref 39.0–52.0)
Hemoglobin: 13.2 g/dL (ref 13.0–17.0)
MCH: 29.8 pg (ref 26.0–34.0)
MCHC: 34.4 g/dL (ref 30.0–36.0)
MCV: 86.7 fL (ref 80.0–100.0)
Platelets: 258 10*3/uL (ref 150–400)
RBC: 4.43 MIL/uL (ref 4.22–5.81)
RDW: 13.2 % (ref 11.5–15.5)
WBC: 13.1 10*3/uL — ABNORMAL HIGH (ref 4.0–10.5)
nRBC: 0 % (ref 0.0–0.2)

## 2021-04-28 LAB — CREATININE, SERUM
Creatinine, Ser: 0.69 mg/dL (ref 0.61–1.24)
GFR, Estimated: >60 mL/min (ref >60)

## 2021-04-28 LAB — POCT ACTIVATED CLOTTING TIME
Activated Clotting Time: 131 seconds
Activated Clotting Time: 221 seconds
Activated Clotting Time: 239 seconds

## 2021-04-28 SURGERY — ENDARTERECTOMY, FEMORAL
Anesthesia: General | Site: Groin | Laterality: Right

## 2021-04-28 MED ORDER — OXYCODONE HCL 5 MG/5ML PO SOLN: 5.0000 mg | Freq: Once | ORAL | Status: AC | PRN

## 2021-04-28 MED ORDER — ASPIRIN EC 81 MG PO TBEC
81.0000 mg | DELAYED_RELEASE_TABLET | Freq: Every day | ORAL | Status: DC
  Administered 2021-04-29 – 2021-04-30 (×2): 81 mg via ORAL
  Filled 2021-04-28 (×2): qty 1

## 2021-04-28 MED ORDER — ALBUTEROL SULFATE (2.5 MG/3ML) 0.083% IN NEBU: 2.5000 mg | INHALATION_SOLUTION | Freq: Four times a day (QID) | RESPIRATORY_TRACT | Status: DC | PRN

## 2021-04-28 MED ORDER — IODIXANOL 320 MG/ML IV SOLN
INTRAVENOUS | Status: DC | PRN
  Administered 2021-04-28: 11:00:00 38 mL via INTRA_ARTERIAL

## 2021-04-28 MED ORDER — VITAMIN B-12 1000 MCG PO TABS
1000.0000 ug | ORAL_TABLET | Freq: Every evening | ORAL | Status: DC
  Administered 2021-04-28 – 2021-04-29 (×2): 1000 ug via ORAL
  Filled 2021-04-28 (×2): qty 1

## 2021-04-28 MED ORDER — CEFAZOLIN SODIUM-DEXTROSE 2-4 GM/100ML-% IV SOLN
2.0000 g | Freq: Three times a day (TID) | INTRAVENOUS | Status: AC
  Administered 2021-04-28 (×2): 2 g via INTRAVENOUS
  Filled 2021-04-28 (×2): qty 100

## 2021-04-28 MED ORDER — AMLODIPINE BESYLATE 10 MG PO TABS
10.0000 mg | ORAL_TABLET | Freq: Every day | ORAL | Status: DC
  Administered 2021-04-28 – 2021-04-30 (×3): 10 mg via ORAL
  Filled 2021-04-28 (×3): qty 1

## 2021-04-28 MED ORDER — ACETAMINOPHEN 500 MG PO TABS: 1000.0000 mg | ORAL_TABLET | Freq: Once | ORAL | Status: DC

## 2021-04-28 MED ORDER — PROPOFOL 10 MG/ML IV BOLUS
INTRAVENOUS | Status: AC
  Filled 2021-04-28: qty 20

## 2021-04-28 MED ORDER — MORPHINE SULFATE (PF) 2 MG/ML IV SOLN: 2.0000 mg | INTRAVENOUS | Status: DC | PRN

## 2021-04-28 MED ORDER — CEFAZOLIN SODIUM-DEXTROSE 2-4 GM/100ML-% IV SOLN
2.0000 g | INTRAVENOUS | Status: AC
  Administered 2021-04-28: 10:00:00 2 g via INTRAVENOUS
  Filled 2021-04-28: qty 100

## 2021-04-28 MED ORDER — OXYCODONE-ACETAMINOPHEN 5-325 MG PO TABS: 1.0000 | ORAL_TABLET | ORAL | Status: DC | PRN

## 2021-04-28 MED ORDER — BISACODYL 10 MG RE SUPP: 10.0000 mg | Freq: Every day | RECTAL | Status: DC | PRN

## 2021-04-28 MED ORDER — HEPARIN SODIUM (PORCINE) 5000 UNIT/ML IJ SOLN
5000.0000 [IU] | Freq: Three times a day (TID) | INTRAMUSCULAR | Status: DC
  Administered 2021-04-29 – 2021-04-30 (×4): 5000 [IU] via SUBCUTANEOUS
  Filled 2021-04-28 (×4): qty 1

## 2021-04-28 MED ORDER — LACTATED RINGERS IV SOLN: INTRAVENOUS | Status: DC | PRN

## 2021-04-28 MED ORDER — VALSARTAN-HYDROCHLOROTHIAZIDE 320-25 MG PO TABS: 1.0000 | ORAL_TABLET | Freq: Every day | ORAL | Status: DC

## 2021-04-28 MED ORDER — SODIUM CHLORIDE 0.9 % IV SOLN: INTRAVENOUS | Status: DC

## 2021-04-28 MED ORDER — ADULT MULTIVITAMIN W/MINERALS CH
1.0000 | ORAL_TABLET | Freq: Every evening | ORAL | Status: DC
  Administered 2021-04-28 – 2021-04-29 (×2): 1 via ORAL
  Filled 2021-04-28 (×2): qty 1

## 2021-04-28 MED ORDER — CHLORHEXIDINE GLUCONATE 0.12 % MT SOLN
15.0000 mL | Freq: Once | OROMUCOSAL | Status: AC
  Administered 2021-04-28: 08:00:00 15 mL via OROMUCOSAL
  Filled 2021-04-28: qty 15

## 2021-04-28 MED ORDER — FENTANYL CITRATE (PF) 250 MCG/5ML IJ SOLN
INTRAMUSCULAR | Status: AC
  Filled 2021-04-28: qty 5

## 2021-04-28 MED ORDER — SODIUM CHLORIDE 0.9 % IV SOLN: 500.0000 mL | Freq: Once | INTRAVENOUS | Status: DC | PRN

## 2021-04-28 MED ORDER — ALUM & MAG HYDROXIDE-SIMETH 200-200-20 MG/5ML PO SUSP: 15.0000 mL | ORAL | Status: DC | PRN

## 2021-04-28 MED ORDER — PANTOPRAZOLE SODIUM 40 MG PO TBEC
40.0000 mg | DELAYED_RELEASE_TABLET | Freq: Every day | ORAL | Status: DC
  Administered 2021-04-29 – 2021-04-30 (×2): 40 mg via ORAL
  Filled 2021-04-28 (×2): qty 1

## 2021-04-28 MED ORDER — HEPARIN 6000 UNIT IRRIGATION SOLUTION
Status: AC
  Filled 2021-04-28: qty 500

## 2021-04-28 MED ORDER — VITAMIN D 25 MCG (1000 UNIT) PO TABS
1000.0000 [IU] | ORAL_TABLET | Freq: Every evening | ORAL | Status: DC
  Administered 2021-04-28 – 2021-04-29 (×2): 1000 [IU] via ORAL
  Filled 2021-04-28 (×2): qty 1

## 2021-04-28 MED ORDER — POTASSIUM CHLORIDE CRYS ER 20 MEQ PO TBCR: 20.0000 meq | EXTENDED_RELEASE_TABLET | Freq: Every day | ORAL | Status: DC | PRN

## 2021-04-28 MED ORDER — RED YEAST RICE 600 MG PO CAPS: 600.0000 mg | ORAL_CAPSULE | Freq: Every evening | ORAL | Status: DC

## 2021-04-28 MED ORDER — INSULIN ASPART 100 UNIT/ML IJ SOLN
0.0000 [IU] | Freq: Three times a day (TID) | INTRAMUSCULAR | Status: DC
Start: 2021-04-28 — End: 2021-04-30
  Administered 2021-04-28: 17:00:00 5 [IU] via SUBCUTANEOUS
  Administered 2021-04-29 – 2021-04-30 (×3): 3 [IU] via SUBCUTANEOUS

## 2021-04-28 MED ORDER — HEPARIN 6000 UNIT IRRIGATION SOLUTION
Status: DC | PRN
  Administered 2021-04-28: 1

## 2021-04-28 MED ORDER — FENTANYL CITRATE (PF) 100 MCG/2ML IJ SOLN
INTRAMUSCULAR | Status: AC
  Filled 2021-04-28: qty 2

## 2021-04-28 MED ORDER — PROPOFOL 10 MG/ML IV BOLUS
INTRAVENOUS | Status: DC | PRN
  Administered 2021-04-28: 100 mg via INTRAVENOUS
  Administered 2021-04-28: 40 mg via INTRAVENOUS

## 2021-04-28 MED ORDER — ACETAMINOPHEN 325 MG PO TABS
325.0000 mg | ORAL_TABLET | ORAL | Status: DC | PRN
  Administered 2021-04-28 – 2021-04-30 (×6): 650 mg via ORAL
  Filled 2021-04-28 (×7): qty 2

## 2021-04-28 MED ORDER — FINASTERIDE 5 MG PO TABS
5.0000 mg | ORAL_TABLET | Freq: Every evening | ORAL | Status: DC
  Administered 2021-04-29: 5 mg via ORAL
  Filled 2021-04-28: qty 1

## 2021-04-28 MED ORDER — CARVEDILOL 12.5 MG PO TABS
12.5000 mg | ORAL_TABLET | Freq: Two times a day (BID) | ORAL | Status: DC
  Administered 2021-04-28 – 2021-04-30 (×4): 12.5 mg via ORAL
  Filled 2021-04-28 (×4): qty 1

## 2021-04-28 MED ORDER — LABETALOL HCL 5 MG/ML IV SOLN: 10.0000 mg | INTRAVENOUS | Status: DC | PRN

## 2021-04-28 MED ORDER — GUAIFENESIN-DM 100-10 MG/5ML PO SYRP: 15.0000 mL | ORAL_SOLUTION | ORAL | Status: DC | PRN

## 2021-04-28 MED ORDER — IRBESARTAN 300 MG PO TABS
300.0000 mg | ORAL_TABLET | Freq: Every day | ORAL | Status: DC
  Administered 2021-04-29 – 2021-04-30 (×2): 300 mg via ORAL
  Filled 2021-04-28 (×2): qty 1

## 2021-04-28 MED ORDER — FENTANYL CITRATE (PF) 100 MCG/2ML IJ SOLN
25.0000 ug | INTRAMUSCULAR | Status: DC | PRN
  Administered 2021-04-28: 12:00:00 50 ug via INTRAVENOUS

## 2021-04-28 MED ORDER — OXYCODONE HCL 5 MG PO TABS
ORAL_TABLET | ORAL | Status: AC
  Filled 2021-04-28: qty 1

## 2021-04-28 MED ORDER — ROCURONIUM BROMIDE 10 MG/ML (PF) SYRINGE
PREFILLED_SYRINGE | INTRAVENOUS | Status: DC | PRN
  Administered 2021-04-28: 70 mg via INTRAVENOUS

## 2021-04-28 MED ORDER — PHENOL 1.4 % MT LIQD: 1.0000 | OROMUCOSAL | Status: DC | PRN

## 2021-04-28 MED ORDER — ATORVASTATIN CALCIUM 10 MG PO TABS: 20.0000 mg | ORAL_TABLET | ORAL | Status: DC

## 2021-04-28 MED ORDER — CEFAZOLIN SODIUM-DEXTROSE 2-4 GM/100ML-% IV SOLN
2.0000 g | Freq: Once | INTRAVENOUS | Status: AC
  Administered 2021-04-29: 2 g via INTRAVENOUS
  Filled 2021-04-28: qty 100

## 2021-04-28 MED ORDER — FENTANYL CITRATE (PF) 100 MCG/2ML IJ SOLN
INTRAMUSCULAR | Status: DC | PRN
  Administered 2021-04-28: 50 ug via INTRAVENOUS
  Administered 2021-04-28 (×2): 100 ug via INTRAVENOUS

## 2021-04-28 MED ORDER — CLOPIDOGREL BISULFATE 75 MG PO TABS
75.0000 mg | ORAL_TABLET | Freq: Every day | ORAL | Status: DC
  Administered 2021-04-29 – 2021-04-30 (×2): 75 mg via ORAL
  Filled 2021-04-28 (×2): qty 1

## 2021-04-28 MED ORDER — PROTAMINE SULFATE 10 MG/ML IV SOLN
INTRAVENOUS | Status: DC | PRN
  Administered 2021-04-28: 50 mg via INTRAVENOUS

## 2021-04-28 MED ORDER — PHENYLEPHRINE 40 MCG/ML (10ML) SYRINGE FOR IV PUSH (FOR BLOOD PRESSURE SUPPORT)
PREFILLED_SYRINGE | INTRAVENOUS | Status: DC | PRN
  Administered 2021-04-28: 160 ug via INTRAVENOUS

## 2021-04-28 MED ORDER — HEPARIN SODIUM (PORCINE) 1000 UNIT/ML IJ SOLN
INTRAMUSCULAR | Status: DC | PRN
  Administered 2021-04-28: 3000 [IU] via INTRAVENOUS
  Administered 2021-04-28: 5000 [IU] via INTRAVENOUS
  Administered 2021-04-28: 2000 [IU] via INTRAVENOUS

## 2021-04-28 MED ORDER — MAGNESIUM SULFATE 2 GM/50ML IV SOLN: 2.0000 g | Freq: Every day | INTRAVENOUS | Status: DC | PRN

## 2021-04-28 MED ORDER — GLYCOPYRROLATE PF 0.2 MG/ML IJ SOSY
PREFILLED_SYRINGE | INTRAMUSCULAR | Status: DC | PRN
  Administered 2021-04-28: .2 mg via INTRAVENOUS

## 2021-04-28 MED ORDER — ONDANSETRON HCL 4 MG/2ML IJ SOLN: 4.0000 mg | Freq: Four times a day (QID) | INTRAMUSCULAR | Status: DC | PRN

## 2021-04-28 MED ORDER — CHLORHEXIDINE GLUCONATE CLOTH 2 % EX PADS: 6.0000 | MEDICATED_PAD | Freq: Once | CUTANEOUS | Status: DC

## 2021-04-28 MED ORDER — HYDROCHLOROTHIAZIDE 25 MG PO TABS
25.0000 mg | ORAL_TABLET | Freq: Every day | ORAL | Status: DC
  Administered 2021-04-29 – 2021-04-30 (×2): 25 mg via ORAL
  Filled 2021-04-28 (×2): qty 1

## 2021-04-28 MED ORDER — LIDOCAINE 2% (20 MG/ML) 5 ML SYRINGE
INTRAMUSCULAR | Status: DC | PRN
  Administered 2021-04-28: 100 mg via INTRAVENOUS

## 2021-04-28 MED ORDER — OXYCODONE HCL 5 MG PO TABS
5.0000 mg | ORAL_TABLET | Freq: Once | ORAL | Status: AC | PRN
  Administered 2021-04-28: 12:00:00 5 mg via ORAL

## 2021-04-28 MED ORDER — CHLORHEXIDINE GLUCONATE CLOTH 2 % EX PADS
6.0000 | MEDICATED_PAD | Freq: Once | CUTANEOUS | Status: DC
  Administered 2021-04-28: 15:00:00 6 via TOPICAL

## 2021-04-28 MED ORDER — ACETAMINOPHEN 650 MG RE SUPP: 325.0000 mg | RECTAL | Status: DC | PRN

## 2021-04-28 MED ORDER — SPIRONOLACTONE 25 MG PO TABS
25.0000 mg | ORAL_TABLET | Freq: Every day | ORAL | Status: DC
  Administered 2021-04-28 – 2021-04-30 (×3): 25 mg via ORAL
  Filled 2021-04-28 (×3): qty 1

## 2021-04-28 MED ORDER — TAMSULOSIN HCL 0.4 MG PO CAPS
0.4000 mg | ORAL_CAPSULE | Freq: Two times a day (BID) | ORAL | Status: DC
  Administered 2021-04-28 – 2021-04-30 (×4): 0.4 mg via ORAL
  Filled 2021-04-28 (×4): qty 1

## 2021-04-28 MED ORDER — 0.9 % SODIUM CHLORIDE (POUR BTL) OPTIME
TOPICAL | Status: DC | PRN
  Administered 2021-04-28: 11:00:00 2000 mL

## 2021-04-28 MED ORDER — HYDRALAZINE HCL 20 MG/ML IJ SOLN: 5.0000 mg | INTRAMUSCULAR | Status: DC | PRN

## 2021-04-28 MED ORDER — HEPARIN SODIUM (PORCINE) 1000 UNIT/ML IJ SOLN
INTRAMUSCULAR | Status: AC
  Filled 2021-04-28: qty 10

## 2021-04-28 MED ORDER — POLYETHYLENE GLYCOL 3350 17 G PO PACK: 17.0000 g | PACK | Freq: Every day | ORAL | Status: DC | PRN

## 2021-04-28 MED ORDER — PHENYLEPHRINE HCL-NACL 20-0.9 MG/250ML-% IV SOLN
INTRAVENOUS | Status: DC | PRN
  Administered 2021-04-28: 50 ug/min via INTRAVENOUS

## 2021-04-28 MED ORDER — ORAL CARE MOUTH RINSE: 15.0000 mL | Freq: Once | OROMUCOSAL | Status: AC

## 2021-04-28 MED ORDER — ONDANSETRON HCL 4 MG/2ML IJ SOLN: 4.0000 mg | Freq: Once | INTRAMUSCULAR | Status: DC | PRN

## 2021-04-28 MED ORDER — AMISULPRIDE (ANTIEMETIC) 5 MG/2ML IV SOLN: 10.0000 mg | Freq: Once | INTRAVENOUS | Status: DC | PRN

## 2021-04-28 MED ORDER — DOCUSATE SODIUM 100 MG PO CAPS
100.0000 mg | ORAL_CAPSULE | Freq: Every day | ORAL | Status: DC
  Administered 2021-04-29 – 2021-04-30 (×2): 100 mg via ORAL
  Filled 2021-04-28 (×2): qty 1

## 2021-04-28 MED ORDER — LACTATED RINGERS IV SOLN: INTRAVENOUS | Status: DC

## 2021-04-28 MED ORDER — METOPROLOL TARTRATE 5 MG/5ML IV SOLN: 2.0000 mg | INTRAVENOUS | Status: DC | PRN

## 2021-04-28 MED ORDER — ONDANSETRON HCL 4 MG/2ML IJ SOLN
INTRAMUSCULAR | Status: DC | PRN
  Administered 2021-04-28: 4 mg via INTRAVENOUS

## 2021-04-28 SURGICAL SUPPLY — 65 items
BAG COUNTER SPONGE SURGICOUNT (BAG) ×3 IMPLANT
BAG SNAP BAND KOVER 36X36 (MISCELLANEOUS) ×1 IMPLANT
CANISTER SUCT 3000ML PPV (MISCELLANEOUS) ×3 IMPLANT
CATH ANGIO BERNSTEIN 5X65X.035 (CATHETERS) ×1 IMPLANT
CATH BEACON 5 .035 65 KMP TIP (CATHETERS) IMPLANT
CATH OMNI FLUSH .035X70CM (CATHETERS) ×1 IMPLANT
CATH OMNI FLUSH 5F 65CM (CATHETERS) ×3 IMPLANT
CHLORAPREP W/TINT 26 (MISCELLANEOUS) ×3 IMPLANT
CLIP VESOCCLUDE MED 24/CT (CLIP) ×3 IMPLANT
CLIP VESOCCLUDE SM WIDE 24/CT (CLIP) ×3 IMPLANT
DERMABOND ADVANCED (GAUZE/BANDAGES/DRESSINGS) ×1
DERMABOND ADVANCED .7 DNX12 (GAUZE/BANDAGES/DRESSINGS) ×2 IMPLANT
DRAIN CHANNEL 15F RND FF W/TCR (WOUND CARE) IMPLANT
DRAPE C-ARM 42X72 X-RAY (DRAPES) IMPLANT
DRAPE HALF SHEET 40X57 (DRAPES) ×1 IMPLANT
ELECT REM PT RETURN 9FT ADLT (ELECTROSURGICAL) ×3
ELECTRODE REM PT RTRN 9FT ADLT (ELECTROSURGICAL) ×2 IMPLANT
EVACUATOR SILICONE 100CC (DRAIN) IMPLANT
GAUZE 4X4 16PLY ~~LOC~~+RFID DBL (SPONGE) ×1 IMPLANT
GLIDEWIRE ADV .035X260CM (WIRE) ×1 IMPLANT
GLOVE SRG 8 PF TXTR STRL LF DI (GLOVE) ×2 IMPLANT
GLOVE SURG ENC MOIS LTX SZ7.5 (GLOVE) ×3 IMPLANT
GLOVE SURG NEOP MICRO LF SZ6.5 (GLOVE) ×1 IMPLANT
GLOVE SURG UNDER POLY LF SZ8 (GLOVE) ×3
GOWN STRL REUS W/ TWL LRG LVL3 (GOWN DISPOSABLE) ×6 IMPLANT
GOWN STRL REUS W/ TWL XL LVL3 (GOWN DISPOSABLE) ×4 IMPLANT
GOWN STRL REUS W/TWL LRG LVL3 (GOWN DISPOSABLE) ×9
GOWN STRL REUS W/TWL XL LVL3 (GOWN DISPOSABLE) ×6
HEMOSTAT SPONGE AVITENE ULTRA (HEMOSTASIS) IMPLANT
KIT BASIN OR (CUSTOM PROCEDURE TRAY) ×3 IMPLANT
KIT ENCORE 26 ADVANTAGE (KITS) ×1 IMPLANT
KIT MICROPUNCTURE NIT STIFF (SHEATH) ×1 IMPLANT
KIT TURNOVER KIT B (KITS) ×3 IMPLANT
MARKER SKIN DUAL TIP RULER LAB (MISCELLANEOUS) ×1 IMPLANT
NS IRRIG 1000ML POUR BTL (IV SOLUTION) ×6 IMPLANT
PACK ENDO MINOR (CUSTOM PROCEDURE TRAY) ×3 IMPLANT
PACK PERIPHERAL VASCULAR (CUSTOM PROCEDURE TRAY) ×3 IMPLANT
PAD ARMBOARD 7.5X6 YLW CONV (MISCELLANEOUS) ×6 IMPLANT
PATCH VASC XENOSURE 1CMX6CM (Vascular Products) ×3 IMPLANT
PATCH VASC XENOSURE 1X6 (Vascular Products) IMPLANT
SET MICROPUNCTURE 5F STIFF (MISCELLANEOUS) IMPLANT
SHEATH BRITE TIP 7FR 35CM (SHEATH) ×1 IMPLANT
SHEATH BRITE TIP 7FRX11 (SHEATH) ×1 IMPLANT
SHEATH PINNACLE 5F 10CM (SHEATH) ×3 IMPLANT
SHEATH PINNACLE 8F 10CM (SHEATH) ×3 IMPLANT
SPONGE T-LAP 18X18 ~~LOC~~+RFID (SPONGE) ×2 IMPLANT
STENT VIABAHNBX 8X29X135 (Permanent Stent) ×1 IMPLANT
STOPCOCK MORSE 400PSI 3WAY (MISCELLANEOUS) ×1 IMPLANT
SUT MNCRL AB 4-0 PS2 18 (SUTURE) ×3 IMPLANT
SUT PROLENE 5 0 C 1 24 (SUTURE) ×8 IMPLANT
SUT PROLENE 6 0 BV (SUTURE) IMPLANT
SUT VIC AB 2-0 CT1 27 (SUTURE) ×6
SUT VIC AB 2-0 CT1 TAPERPNT 27 (SUTURE) ×2 IMPLANT
SUT VIC AB 3-0 SH 27 (SUTURE) ×3
SUT VIC AB 3-0 SH 27X BRD (SUTURE) ×2 IMPLANT
SYR MEDRAD MARK V 150ML (SYRINGE) ×3 IMPLANT
TOWEL GREEN STERILE (TOWEL DISPOSABLE) ×3 IMPLANT
TRAY FOLEY MTR SLVR 16FR STAT (SET/KITS/TRAYS/PACK) IMPLANT
TUBING HIGH PRESSURE 120CM (CONNECTOR) IMPLANT
TUBING INJECTOR 48 (MISCELLANEOUS) ×1 IMPLANT
UNDERPAD 30X36 HEAVY ABSORB (UNDERPADS AND DIAPERS) ×3 IMPLANT
WATER STERILE IRR 1000ML POUR (IV SOLUTION) ×3 IMPLANT
WIRE AMPLATZ SS-J .035X180CM (WIRE) IMPLANT
WIRE AMPLATZ SS-J .035X260CM (WIRE) IMPLANT
WIRE BENTSON .035X145CM (WIRE) ×5 IMPLANT

## 2021-04-28 NOTE — Progress Notes (Signed)
Inpatient Diabetes Program Recommendations  AACE/ADA: New Consensus Statement on Inpatient Glycemic Control (2015)  Target Ranges:  Prepandial:   less than 140 mg/dL      Peak postprandial:   less than 180 mg/dL (1-2 hours)      Critically ill patients:  140 - 180 mg/dL   Lab Results  Component Value Date   GLUCAP 136 (H) 04/28/2021   HGBA1C 7.4 (H) 04/18/2021    Review of Glycemic Control  Diabetes history: type 2 Outpatient Diabetes medications: Novolin 70/30 insulin 40-60 units at breakfast, 20-40 units at supper (max dose 100 units total), Metformin 1000 mg BID Current orders for Inpatient glycemic control: Novolog MODERATE correction scale TID  Inpatient Diabetes Program Recommendations:   Received diabetes coordinator consult for postop insulin management. Patient is still NPO.   Recommend starting Semglee 20 units at HS (weight based 81.6 kg X 0.2 units/kg=16.32 units) and continue Novolog MODERATE correction scale TID when eating.Titrate dosages as needed.   Smith Mince RN BSN CDE Diabetes Coordinator Pager: [phone]  8am-5pm

## 2021-04-28 NOTE — Transfer of Care (Signed)
Immediate Anesthesia Transfer of Care Note  Patient: Todd Krause  Procedure(s) Performed: RIGHT FEMORAL ARTERY ENDARTERECTOMY (Right: Groin) INSERTION OF RIGHT ILIAC STENT (Right: Groin) PROFUNDOPLASTY (Right: Groin) AORTOGRAM (Groin)  Patient Location: PACU  Anesthesia Type:General  Level of Consciousness: drowsy, patient cooperative and responds to stimulation  Airway & Oxygen Therapy: Patient Spontanous Breathing and Patient connected to face mask oxygen  Post-op Assessment: Report given to RN and Post -op Vital signs reviewed and stable  Post vital signs: Reviewed and stable  Last Vitals:  Vitals Value Taken Time  BP 127/51 04/28/21 1152  Temp    Pulse 63 04/28/21 1152  Resp 18 04/28/21 1152  SpO2 98 % 04/28/21 1152  Vitals shown include unvalidated device data.  Last Pain:  Vitals:   04/28/21 0725  TempSrc: Oral      Patients Stated Pain Goal: 3 (04/28/21 0743)  Complications: No notable events documented.

## 2021-04-28 NOTE — Progress Notes (Addendum)
Patient brought to 4E from PACU. Telemetry box applied, CCMD notified. VSS. CHG bath completed. Patient oriented to room and staff. Call bell in reach.  Ondre Salvetti L Davidson Palmieri, RN  

## 2021-04-28 NOTE — Op Note (Signed)
Date: April 28, 2021  Preoperative diagnosis: Right lower extremity short distance lifestyle limiting claudication with multilevel disease including common iliac and common femoral artery disease  Postoperative diagnosis: Same  Procedure: 1.  Aortogram with iliac arteriogram 2.  Right common iliac artery angioplasty with stent placement (8 mm x 29 mm VBX) 3.  Right common femoral endarterectomy with profundoplasty and bovine pericardial patch angioplasty  Surgeon: Dr. [name], MD  Assistant: Doreatha Massed, PA  Indications: Patient is a 70 year old male that has previously been followed by Dr. Allyson Sabal for progressive lower extremity claudication.  He previously underwent attempt at angiogram and this was unsuccessful after attempted transfemoral access.  CTA subsequently showed high-grade bilateral common femoral artery stenosis as well as a high-grade right common iliac stenosis.  He presents today for treatment of the right side given this is the more symptomatic side.  An assistant was needed for exposure and to expedite the case.  Findings: The right common iliac artery had a high-grade calcified stenosis greater than 95%.  This was crossed retrograde from the right common femoral artery after the arteriotomy was performed and then stented with an 8 mm x 29 mm VBX with no residual stenosis.  The right common femoral artery also had a heavily calcified plaque with near subtotal occlusion.  Endarterectomy was carried down with eversion endarterectomy of the profunda and proximal SFA.  Bovine pericardial patch was sewn to the right common femoral artery with brisk pulsatile inflow.  Palpable PT pulse at completion  Anesthesia: General   Details: Patient was taken to the operating room after informed consent was obtained.  Placed on the operative table in the supine position.  General endotracheal anesthesia was induced.  Patient was then prepped and draped in standard sterile  fashion.  Antibiotics were given.  Timeout was performed.  Initially made a horizontal groin incision in the right groin where the common femoral artery was palpated.  Dissected down with Bovie cautery and opened the femoral sheath longitudinally.  At that point in time the SFA and profunda were dissected out controlled with Vesseloops and the distal external iliac artery was controlled Vesseloops.  Patient was given 100 units/kg IV heparin.  ACT was checked to maintain greater than 250.  The right common femoral was opened with 11 blade scalpel extended Potts scissors and we could visualize the true lumen of the external iliac artery where a Bentson wire was placed retrograde and we placed a short 7 [place] sheath.  I then used a Bentson wire with a KMP catheter to cross the right common iliac high-grade stenosis retrograde that was visualized on CT scan.  We confirmed hand-injection that we are in the true lumen of the aorta with filling of the contralateral iliac artery.  We get some dedicated imaging with left anterior oblique and identified the right common iliac artery stenosis and hypogastric artery.  We then selected an 8 mm x 29 mm VBX that was deployed into the right common iliac artery with no residual stenosis and no evidence of dissection.  I did get an image of the right external iliac that showed patent vessel.  That point in time the arteriotomy in the common femoral was extended all the way down to the takeoff of the SFA and profunda.  The right common femoral artery was then endarterectomized with a Penfield elevator including eversion endarterectomy of the proximal SFA and profunda.  We had good backbleeding from all the vessels and could visualize a healthy  endpoint.  At that point in time I checked the inflow and had pulsatile inflow and our wire was removed that we used for retrograde stenting.  A bovine pericardial patch was brought on the field and sewn to the common femoral artery with 5-0  Prolene parachute technique and this was de-aired prior to completion.  Once we came off clamps we had triphasic signal in the profunda and SFA.  The PT had an excellent signal.  Protamine was given for reversal.  The groin was irrigated out.  The groin was closed in multiple layers of 2-0 Vicryl, 3-0 Vicryl, 4-0 Monocryl and Dermabond.  Taken to recovery in stable condition.  Complication: None  Condition: Stable  [name], MD [facility] Office: [phone]   [name]

## 2021-04-28 NOTE — Anesthesia Postprocedure Evaluation (Signed)
Anesthesia Post Note  Patient: Todd Krause  Procedure(s) Performed: RIGHT FEMORAL ARTERY ENDARTERECTOMY (Right: Groin) INSERTION OF RIGHT ILIAC STENT (Right: Groin) PROFUNDOPLASTY (Right: Groin) AORTOGRAM (Groin)     Patient location during evaluation: PACU Anesthesia Type: General Level of consciousness: awake and alert Pain management: pain level controlled Vital Signs Assessment: post-procedure vital signs reviewed and stable Respiratory status: spontaneous breathing, nonlabored ventilation and respiratory function stable Cardiovascular status: blood pressure returned to baseline and stable Postop Assessment: no apparent nausea or vomiting Anesthetic complications: no   No notable events documented.  Last Vitals:  Vitals:   04/28/21 1253 04/28/21 1353  BP: (!) 118/49 (!) 121/59  Pulse: (!) 58 74  Resp: 10 19  Temp: 36.6 C   SpO2: 94% 97%    Last Pain:  Vitals:   04/28/21 1353  TempSrc:   PainSc: 3                  Lucretia Kern

## 2021-04-28 NOTE — Evaluation (Signed)
Occupational Therapy Evaluation Patient Details Name: Todd Krause MRN: [id number] DOB: 1950-11-20 Today's Date: 04/28/2021   History of Present Illness 70 y.o. male, with history of hypertension, hyperlipidemia, diabetes, tobacco abuse.  S/p Aortogram with iliac arteriogram, Right common iliac artery angioplasty with stent placement, and Right common femoral endarterectomy with profundoplasty and bovine pericardial patch angioplasty 12/30.   Clinical Impression   Patient admitted for the above procedure.  PTA he lives at home, his son also resides at the home.  Both work, and he has needed no assist for AD:/IADL or mobility.  Patient states he could walk about 25 yards and need to take stand breaks.  Post surgical pain is the primary deficits.  Currently he is needing generalized supervision for mobility, and assist to reach his R foot.  OT can follow in the acute setting, but he should progress quickly.  No post acute OT is anticipated.        Recommendations for follow up therapy are one component of a multi-disciplinary discharge planning process, led by the attending physician.  Recommendations may be updated based on patient status, additional functional criteria and insurance authorization.   Follow Up Recommendations  No OT follow up    Assistance Recommended at Discharge PRN  Functional Status Assessment  Patient has had a recent decline in their functional status and demonstrates the ability to make significant improvements in function in a reasonable and predictable amount of time.  Equipment Recommendations  None recommended by OT    Recommendations for Other Services       Precautions / Restrictions Precautions Precautions: Fall Restrictions Weight Bearing Restrictions: No      Mobility Bed Mobility Overal bed mobility: Needs Assistance Bed Mobility: Supine to Sit;Sit to Supine     Supine to sit: Supervision Sit to supine: Supervision         Transfers Overall transfer level: Needs assistance   Transfers: Sit to/from Stand Sit to Stand: Min guard                  Balance Overall balance assessment: Mild deficits observed, not formally tested                                         ADL either performed or assessed with clinical judgement   ADL       Grooming: Wash/dry hands;Supervision/safety;Standing               Lower Body Dressing: Minimal assistance;Sit to/from stand   Toilet Transfer: Supervision/safety;Ambulation                   Vision Patient Visual Report: No change from baseline       Perception Perception Perception: Not tested   Praxis Praxis Praxis: Not tested    Pertinent Vitals/Pain Pain Assessment: Faces Faces Pain Scale: Hurts little more Pain Location: R groin incision Pain Descriptors / Indicators: Tender Pain Intervention(s): Monitored during session     Hand Dominance Right   Extremity/Trunk Assessment Upper Extremity Assessment Upper Extremity Assessment: Overall WFL for tasks assessed   Lower Extremity Assessment Lower Extremity Assessment: Defer to PT evaluation   Cervical / Trunk Assessment Cervical / Trunk Assessment: Normal   Communication Communication Communication: No difficulties   Cognition Arousal/Alertness: Awake/alert Behavior During Therapy: WFL for tasks assessed/performed;Impulsive Overall Cognitive Status: Within Functional Limits for tasks assessed  Home Living Family/patient expects to be discharged to:: Private residence Living Arrangements: Children Available Help at Discharge: Family;Available 24 hours/day Type of Home: House Home Access: Stairs to enter [employer] of Steps: 5 Entrance Stairs-Rails: Can reach both;Right;Left Home Layout: One level     Bathroom Shower/Tub: Tub/shower unit;Walk-in shower    Bathroom Toilet: Standard     Home Equipment: None          Prior Functioning/Environment Prior Level of Function : Independent/Modified Independent;Driving             Mobility Comments: No assist.  Continues to keep busy with wood shop at his home. ADLs Comments: No assist        OT Problem List: Decreased safety awareness;Impaired balance (sitting and/or standing);Pain      OT Treatment/Interventions: Self-care/ADL training;Patient/family education;Balance training;Therapeutic activities    OT Goals(Current goals can be found in the care plan section) Acute Rehab OT Goals Patient Stated Goal: Return home OT Goal Formulation: With patient Time For Goal Achievement: 05/12/21 Potential to Achieve Goals: Good ADL Goals Pt Will Perform Grooming: with modified independence;standing Pt Will Perform Lower Body Dressing: with modified independence;sit to/from stand Pt Will Transfer to Toilet: with modified independence;ambulating;regular height toilet  OT Frequency: Min 2X/week   Barriers to D/C:    none noted       Co-evaluation              AM-PAC OT "6 Clicks" Daily Activity     Outcome Measure Help from another person eating meals?: None Help from another person taking care of personal grooming?: None Help from another person toileting, which includes using toliet, bedpan, or urinal?: A Little Help from another person bathing (including washing, rinsing, drying)?: A Little Help from another person to put on and taking off regular upper body clothing?: None Help from another person to put on and taking off regular lower body clothing?: A Little 6 Click Score: 21   End of Session    Activity Tolerance: Patient tolerated treatment well Patient left: in bed;with call bell/phone within reach  OT Visit Diagnosis: Unsteadiness on feet (R26.81);Pain Pain - Right/Left: Right Pain - part of body: Leg                Time: 1639-1700 OT Time Calculation (min):  21 min Charges:  OT General Charges $OT Visit: 1 Visit OT Evaluation $OT Eval Moderate Complexity: 1 Mod  04/28/2021  RP, OTR/L  Acute Rehabilitation Services  Office:  [phone]   Suzanna Obey 04/28/2021, 5:08 PM

## 2021-04-28 NOTE — Discharge Instructions (Signed)
 [facility]  Discharge instructions  Lower Extremity Bypass Surgery  Please refer to the following instruction for your post-procedure care. Your surgeon or physician assistant will discuss any changes with you.  Activity  You are encouraged to walk as much as you can. You can slowly return to normal activities during the month after your surgery. Avoid strenuous activity and heavy lifting until your doctor tells you it's OK. Avoid activities such as vacuuming or swinging a golf club. Do not drive until your doctor give the OK and you are no longer taking prescription pain medications. It is also normal to have difficulty with sleep habits, eating and bowel movement after surgery. These will go away with time.  Bathing/Showering  Shower daily after you go home. Do not soak in a bathtub, hot tub, or swim until the incision heals completely.  Incision Care  Clean your incision with mild soap and water. Shower every day. Pat the area dry with a clean towel. You do not need a bandage unless otherwise instructed. Do not apply any ointments or creams to your incision. If you have open wounds you will be instructed how to care for them or a visiting nurse may be arranged for you. If you have staples or sutures along your incision they will be removed at your post-op appointment. You may have skin glue on your incision. Do not peel it off. It will come off on its own in about one week.  Wash the groin wound with soap and water daily and pat dry. (No tub bath-only shower)  Then put a dry gauze or washcloth in the groin to keep this area dry to help prevent wound infection.  Do this daily and as needed.  Do not use Vaseline or neosporin on your incisions.  Only use soap and water on your incisions and then protect and keep dry.  Diet  Resume your normal diet. There are no special food restrictions following this procedure. A low fat/ low cholesterol diet is  recommended for all patients with vascular disease. In order to heal from your surgery, it is CRITICAL to get adequate nutrition. Your body requires vitamins, minerals, and protein. Vegetables are the best source of vitamins and minerals. Vegetables also provide the perfect balance of protein. Processed food has little nutritional value, so try to avoid this.  Medications  Resume taking all your medications unless your doctor or physician assistant tells you not to. If your incision is causing pain, you may take over-the-counter pain relievers such as acetaminophen (Tylenol). If you were prescribed a stronger pain medication, please aware these medication can cause nausea and constipation. Prevent nausea by taking the medication with a snack or meal. Avoid constipation by drinking plenty of fluids and eating foods with high amount of fiber, such as fruits, vegetables, and grains. Take Colace 100 mg (an over-the-counter stool softener) twice a day as needed for constipation.  Do not take Tylenol if you are taking prescription pain medications.  Follow Up  Our office will schedule a follow up appointment 2-3 weeks following discharge.  Please call us immediately for any of the following conditions  Severe or worsening pain in your legs or feet while at rest or while walking Increase pain, redness, warmth, or drainage (pus) from your incision site(s) Fever of 101 degree or higher The swelling in your leg with the bypass suddenly worsens and becomes more painful than when you were in the hospital If you have   been instructed to feel your graft pulse then you should do so every day. If you can no longer feel this pulse, call the office immediately. Not all patients are given this instruction.  Leg swelling is common after leg bypass surgery.  The swelling should improve over a few months following surgery. To improve the swelling, you may elevate your legs above the level of your heart while you are  sitting or resting. Your surgeon or physician assistant may ask you to apply an ACE wrap or wear compression (TED) stockings to help to reduce swelling.  Reduce your risk of vascular disease  Stop smoking. If you would like help call [facility] at 1-800-QUIT-NOW (1-800-784-8669) or [place] at 336-586-4000.  Manage your cholesterol Maintain a desired weight Control your diabetes weight Control your diabetes Keep your blood pressure down  If you have any questions, please call the office at [phone]  

## 2021-04-28 NOTE — Anesthesia Procedure Notes (Signed)
Arterial Line Insertion Start/End12/30/2022 8:40 AM, 04/28/2021 8:54 AM Performed by: Elliot Dally, CRNA, CRNA  Patient location: Pre-op. Preanesthetic checklist: patient identified, IV checked, site marked, risks and benefits discussed, surgical consent, monitors and equipment checked, pre-op evaluation, timeout performed and anesthesia consent Left, radial was placed Catheter size: 20 G Hand hygiene performed  and maximum sterile barriers used   Attempts: 1 Procedure performed without using ultrasound guided technique.

## 2021-04-28 NOTE — H&P (Signed)
History and Physical Interval Note:  04/28/2021 8:45 AM  Todd Krause  has presented today for surgery, with the diagnosis of PAD.  The various methods of treatment have been discussed with the patient and family. After consideration of risks, benefits and other options for treatment, the patient has consented to  Procedure(s): RIGHT FEMORAL ARTERY ENDARTERECTOMY (Right) POSSIBLE INSERTION OF RIGHT ILIAC STENT (Right) as a surgical intervention.  The patient's history has been reviewed, patient examined, no change in status, stable for surgery.  I have reviewed the patient's chart and labs.  Questions were answered to the patient's satisfaction.    Discussed plan for right femoral endarterectomy with retrograde iliac stenting for lifestyle limiting short distance claudication.  Risk benefits again discussed.  Todd Krause  Patient name: Todd Krause           MRN: [id number]        DOB: 08/28/1950          Sex: male   REASON FOR CONSULT: Follow-up after CTA   HPI: Todd Krause is a 70 y.o. male, with history of hypertension, hyperlipidemia, diabetes, tobacco abuse that presents to discuss results of CTA.  He was referred by Dr. Allyson Sabal for evaluation of bilateral lower extremity claudication.  Patient states he has had significant symptoms over the last 1.5 to 2 years.  States it has gotten much worse and he now can only walk about 25 yards before he has to stop and gets cramping in both calves.  He also describes discomfort in both hips that radiates down his legs.  Unable to get through the grocery store.  He underwent angiogram with Dr. Allyson Sabal in the Cath Lab on 02/06/2021 through right transfemoral access.  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         Past Medical History:  Diagnosis Date   Arthritis      hands, knees   BPH (benign prostatic hypertrophy)     Chronic bronchitis (HCC)     ED  (erectile dysfunction)     Elevated PSA     GERD (gastroesophageal reflux disease)     History of colon polyps     Hyperlipidemia     Hypertension     Lower urinary tract symptoms (LUTS)     OSA (obstructive sleep apnea)      mild to moderate osa per study 02-18-2004--  no cpap   Prostate nodule     Type 2 diabetes mellitus (HCC)             Past Surgical History:  Procedure Laterality Date   ABDOMINAL AORTOGRAM W/LOWER EXTREMITY N/A 02/06/2021    Procedure: ABDOMINAL AORTOGRAM W/LOWER EXTREMITY;  Surgeon: [name], MD;  Location: [facility];  Service: Cardiovascular;  Laterality: N/A;   CARDIAC CATHETERIZATION   11-21-2009  dr Christiane Ha berry    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,  50% mCFX,  mild pulmonary HTN/  preserved LV , ef >60%   COLONOSCOPY   last one -- april 2016   EYE SURGERY Bilateral      iol lens for cataracts   melanoma removed from back   15 yrs ago   PROSTATE BIOPSY N/A 10/01/2014    Procedure: BIOPSY TRANSRECTAL ULTRASONIC PROSTATE (TUBP);  Surgeon: Jethro Bolus, MD;  Location: [facility];  Service: Urology;  Laterality: N/A;   TRANSTHORACIC ECHOCARDIOGRAM   11-20-2009  grade I diastolic dysfunction/  ef 60-65%/  mild AR, MR, and TR/  trivial PR   TRANSURETHRAL RESECTION OF PROSTATE N/A 05/09/2015    Procedure: CYSTOSCOPY TRANSURETHRAL RESECTION OF THE PROSTATE (TURP);  Surgeon: Carolan Clines, MD;  Location: [facility];  Service: Urology;  Laterality: N/A;           Family History  Problem Relation Age of Onset   Diabetes Mother     Cancer Mother     Diabetes Father     Heart attack Father     Colon cancer Neg Hx     Colon polyps Neg Hx     Kidney disease Neg Hx     Gallbladder disease Neg Hx     Esophageal cancer Neg Hx        SOCIAL HISTORY: Social History         Socioeconomic History   Marital status: Married      Spouse name: Not on file   Number of children: 2   Years of education: Not on file    Highest education level: Not on file  Occupational History   Occupation: Psychologist, sport and exercise (cattle)  Tobacco Use   Smoking status: Every Day      Packs/day: 1.00      Years: 30.00      Pack years: 30.00      Types: Cigarettes      Last attempt to quit: 09/23/2006      Years since quitting: 14.5   Smokeless tobacco: Never  Substance and Sexual Activity   Alcohol use: No      Alcohol/week: 0.0 standard drinks   Drug use: No   Sexual activity: Never  Other Topics Concern   Not on file  Social History Narrative    No health insurance    Social Determinants of Health    Financial Resource Strain: Not on file  Food Insecurity: Not on file  Transportation Needs: Not on file  Physical Activity: Not on file  Stress: Not on file  Social Connections: Not on file  Intimate Partner Violence: Not on file           Allergies  Allergen Reactions   Latex Hives   Tramadol Rash   Codeine Other (See Comments)      constipation            Current Outpatient Medications  Medication Sig Dispense Refill   acetaminophen (TYLENOL) 500 MG tablet Take 1,000 mg by mouth every 6 (six) hours as needed for moderate pain.       albuterol (PROVENTIL) (2.5 MG/3ML) 0.083% nebulizer solution Take 2.5 mg by nebulization every 6 (six) hours as needed for wheezing or shortness of breath.       Alcohol Swabs (ALCOHOL WIPES) 70 % PADS Patient checks blood sugars bid       amLODipine (NORVASC) 10 MG tablet Take 1 tablet by mouth daily.       Aspirin-Acetaminophen-Caffeine (GOODY HEADACHE PO) Take 1 each by mouth daily as needed (pain).       atorvastatin (LIPITOR) 20 MG tablet Take 20 mg by mouth 3 (three) times a week.       Blood Glucose Monitoring Suppl (ACCU-CHEK GUIDE) w/Device KIT Use as directed by physician to monitor blood sugars       carvedilol (COREG) 12.5 MG tablet Take 1 tablet (12.5 mg total) by mouth 2 (two) times daily. 180 tablet 3   celecoxib (CELEBREX) 200 MG capsule Take 1 capsule by mouth  2  (two) times daily.       Cholecalciferol (VITAMIN D3) 2000 UNITS TABS Take 1 capsule by mouth every evening.        finasteride (PROSCAR) 5 MG tablet Take 1 tablet (5 mg total) by mouth daily. (Patient taking differently: Take 5 mg by mouth every evening.) 30 tablet 1   finasteride (PROSCAR) 5 MG tablet Take 1 tablet by mouth daily.       insulin isophane & regular human KwikPen (NOVOLIN 70/30 KWIKPEN) (70-30) 100 UNIT/ML KwikPen INJECT 40-60 UNITS 30 MIN BEFORE BREAKFAST, INJECT 20-40 UNITS 30 MIN BEFORE EVENING MEAL BASED ON TITRATION GUIDELINES **Maximum daily dose 100 units**       metFORMIN (GLUCOPHAGE) 1000 MG tablet Take 1,000 mg by mouth 2 (two) times daily.       Multiple Vitamin (MULTIVITAMIN) tablet Take 1 tablet by mouth every evening.        naproxen sodium (ANAPROX) 220 MG tablet Take 440 mg by mouth every 12 (twelve) hours as needed (pain).       omeprazole (PRILOSEC) 20 MG capsule Take 1 capsule (20 mg total) by mouth daily. (Patient taking differently: Take 20 mg by mouth 2 (two) times daily.) 90 capsule 0   Red Yeast Rice 600 MG CAPS Take 600 mg by mouth every evening.        spironolactone (ALDACTONE) 25 MG tablet Take 1 tablet (25 mg total) by mouth daily. 90 tablet 3   tamsulosin (FLOMAX) 0.4 MG CAPS capsule Take 1 capsule by mouth 2 (two) times daily.       valsartan-hydrochlorothiazide (DIOVAN-HCT) 320-25 MG tablet Take 1 tablet by mouth daily.       vitamin B-12 (CYANOCOBALAMIN) 1000 MCG tablet Take 1,000 mcg by mouth every evening.                  Current Facility-Administered Medications  Medication Dose Route Frequency Provider Last Rate Last Admin   sodium chloride flush (NS) 0.9 % injection 3 mL  3 mL Intravenous Q12H Lorretta Harp, MD          REVIEW OF SYSTEMS:  $RemoveB'[X]'NKUhnaZh$  denotes positive finding, $RemoveBeforeDEI'[ ]'TNCJNbgtVivThwfM$  denotes negative finding Cardiac   Comments:  Chest pain or chest pressure:      Shortness of breath upon exertion:      Short of breath when lying flat:       Irregular heart rhythm:             Vascular      Pain in calf, thigh, or hip brought on by ambulation: x    Pain in feet at night that wakes you up from your sleep:       Blood clot in your veins:      Leg swelling:              Pulmonary      Oxygen at home:      Productive cough:       Wheezing:              Neurologic      Sudden weakness in arms or legs:       Sudden numbness in arms or legs:       Sudden onset of difficulty speaking or slurred speech:      Temporary loss of vision in one eye:       Problems with dizziness:              Gastrointestinal  Blood in stool:       Vomited blood:              Genitourinary      Burning when urinating:       Blood in urine:             Psychiatric      Major depression:              Hematologic      Bleeding problems:      Problems with blood clotting too easily:             Skin      Rashes or ulcers:             Constitutional      Fever or chills:          PHYSICAL EXAM:    Vitals:    04/04/21 1456  BP: (!) 152/71  Pulse: 76  Resp: 18  Temp: (!) 97.5 F (36.4 C)  TempSrc: Temporal  SpO2: 95%  Weight: 182 lb (82.6 kg)  Height: $Remove'5\' 8"'NqcyzKT$  (1.727 m)      GENERAL: The patient is a well-nourished male, in no acute distress. The vital signs are documented above. CARDIAC: There is a regular rate and rhythm.  VASCULAR:  No palpable right femoral pulse No palpable right pedal pulse 1+ palpable left femoral pulse 1+ left DP pulse PULMONARY: No respiratory distress. ABDOMEN: Soft and non-tender. MUSCULOSKELETAL: There are no major deformities or cyanosis. NEUROLOGIC: No focal weakness or paresthesias are detected. SKIN: There are no ulcers or rashes noted. PSYCHIATRIC: The patient has a normal affect.   DATA:    ABIs 12/21/2020 are 0.59 on the right monophasic and 0.72 on the left multiphasic   CTA today was reviewed and shows high-grade stenosis in bilateral common femoral arteries.  He also has a  high-grade focal stenosis in the right common iliac artery.  Abdominal aorta is patent.   Assessment/Plan:   70 year old male presents for evaluation of bilateral lower extremity lifestyle limiting claudication and to discuss CTA results.  He was referred by Dr. Gwenlyn Found and underwent right transfemoral access for angiogram on 02/06/2021.  I have reviewed his CTA and discussed that he does not need an aortobifemoral bypass given his aorta and both iliac arteries are patent.  He does significant bilateral common femoral disease as well as right common iliac disease.  I discussed a staged approach with initial plans for right common femoral endarterectomy with possible retrograde iliac stenting.  Discussed if he has significant improvement after intervention in the right leg we can do a left common femoral endarterectomy in the future.  Risk benefits discussed.  We will get him scheduled today.  He has already undergone cardiac clearance.     Marty Heck, MD [facility] Office: [phone]

## 2021-04-28 NOTE — Anesthesia Procedure Notes (Signed)
Procedure Name: Intubation Date/Time: 04/28/2021 9:53 AM Performed by: Ayesha Rumpf, CRNA Pre-anesthesia Checklist: Patient identified, Emergency Drugs available, Suction available and Patient being monitored Patient Re-evaluated:Patient Re-evaluated prior to induction Oxygen Delivery Method: Circle System Utilized Preoxygenation: Pre-oxygenation with 100% oxygen Induction Type: IV induction Ventilation: Mask ventilation without difficulty Laryngoscope Size: Glidescope and 4 Grade View: Grade II Tube type: Oral Tube size: 7.5 mm Number of attempts: 1 Airway Equipment and Method: Stylet and Oral airway Placement Confirmation: ETT inserted through vocal cords under direct vision, positive ETCO2 and breath sounds checked- equal and bilateral Secured at: 23 cm Tube secured with: Tape Dental Injury: Teeth and Oropharynx as per pre-operative assessment  Difficulty Due To: Difficulty was anticipated and Difficult Airway- due to dentition Comments: Pt with poor dentition at baseline.  Unchanged after intubation/airway instrumentation.

## 2021-04-29 DIAGNOSIS — Z95820 Peripheral vascular angioplasty status with implants and grafts: Secondary | ICD-10-CM

## 2021-04-29 DIAGNOSIS — Z9889 Other specified postprocedural states: Secondary | ICD-10-CM

## 2021-04-29 LAB — GLUCOSE, CAPILLARY
Glucose-Capillary: 171 mg/dL — ABNORMAL HIGH (ref 70–99)
Glucose-Capillary: 167 mg/dL — ABNORMAL HIGH (ref 70–99)
Glucose-Capillary: 118 mg/dL — ABNORMAL HIGH (ref 70–99)
Glucose-Capillary: 161 mg/dL — ABNORMAL HIGH (ref 70–99)

## 2021-04-29 LAB — CBC
HCT: 36 % — ABNORMAL LOW (ref 39.0–52.0)
Hemoglobin: 12.2 g/dL — ABNORMAL LOW (ref 13.0–17.0)
MCH: 29.5 pg (ref 26.0–34.0)
MCHC: 33.9 g/dL (ref 30.0–36.0)
MCV: 87.2 fL (ref 80.0–100.0)
Platelets: 242 10*3/uL (ref 150–400)
RBC: 4.13 MIL/uL — ABNORMAL LOW (ref 4.22–5.81)
RDW: 13.2 % (ref 11.5–15.5)
WBC: 10.5 10*3/uL (ref 4.0–10.5)
nRBC: 0 % (ref 0.0–0.2)

## 2021-04-29 LAB — BASIC METABOLIC PANEL
Anion gap: 8 (ref 5–15)
BUN: 13 mg/dL (ref 8–23)
CO2: 26 mmol/L (ref 22–32)
Calcium: 8.9 mg/dL (ref 8.9–10.3)
Chloride: 102 mmol/L (ref 98–111)
Creatinine, Ser: 0.79 mg/dL (ref 0.61–1.24)
GFR, Estimated: >60 mL/min (ref >60)
Glucose, Bld: 158 mg/dL — ABNORMAL HIGH (ref 70–99)
Potassium: 4.5 mmol/L (ref 3.5–5.1)
Sodium: 136 mmol/L (ref 135–145)

## 2021-04-29 LAB — LIPID PANEL
Cholesterol: 118 mg/dL (ref 0–200)
HDL: 30 mg/dL — ABNORMAL LOW (ref >40)
LDL Cholesterol: 54 mg/dL (ref 0–99)
Total CHOL/HDL Ratio: 3.9 RATIO
Triglycerides: 170 mg/dL — ABNORMAL HIGH (ref <150)
VLDL: 34 mg/dL (ref 0–40)

## 2021-04-29 NOTE — Progress Notes (Addendum)
°  Progress Note    04/29/2021 7:11 AM 1 Day Post-Op  Subjective:  says he's sore.  Says his foot is warm.  Denies pain in his calf.  Catheter removed this morning and he has not yet voided.    Afebrile HR 60's-90's NSR 120's-140's systolic 93% RA  Vitals:   04/28/21 2316 04/29/21 0345  BP: 127/62 (!) 147/60  Pulse: 64 65  Resp: 16 17  Temp: 98 F (36.7 C) 97.9 F (36.6 C)  SpO2: 92% 93%    Physical Exam: Cardiac:  regular Lungs:  non labored Incisions:  clean and dry Extremities:  palpable right DP/PT pulses; calf is soft and non tender   CBC    Component Value Date/Time   WBC 10.5 04/29/2021 0304   RBC 4.13 (L) 04/29/2021 0304   HGB 12.2 (L) 04/29/2021 0304   HGB 14.5 01/27/2021 1535   HCT 36.0 (L) 04/29/2021 0304   HCT 41.2 01/27/2021 1535   PLT 242 04/29/2021 0304   PLT 316 01/27/2021 1535   MCV 87.2 04/29/2021 0304   MCV 80 01/27/2021 1535   MCH 29.5 04/29/2021 0304   MCHC 33.9 04/29/2021 0304   RDW 13.2 04/29/2021 0304   RDW 14.3 01/27/2021 1535   LYMPHSABS 2.7 02/14/2016 0521   MONOABS 0.7 02/14/2016 0521   EOSABS 0.4 02/14/2016 0521   BASOSABS 0.0 02/14/2016 0521    BMET    Component Value Date/Time   NA 136 04/29/2021 0304   NA 135 01/27/2021 1535   K 4.5 04/29/2021 0304   CL 102 04/29/2021 0304   CO2 26 04/29/2021 0304   GLUCOSE 158 (H) 04/29/2021 0304   BUN 13 04/29/2021 0304   BUN 13 01/27/2021 1535   CREATININE 0.79 04/29/2021 0304   CREATININE 0.96 05/19/2013 1627   CALCIUM 8.9 04/29/2021 0304   GFRNONAA >60 04/29/2021 0304   GFRAA >60 02/12/2016 0519    INR    Component Value Date/Time   INR 1.1 04/18/2021 1118     Intake/Output Summary (Last 24 hours) at 04/29/2021 0711 Last data filed at 04/29/2021 0550 Gross per 24 hour  Intake 3538.46 ml  Output 5750 ml  Net -2211.54 ml     Assessment/Plan:  70 y.o. male is s/p:  1.  Aortogram with iliac arteriogram 2.  Right common iliac artery angioplasty with stent  placement (8 mm x 29 mm VBX) 3.  Right common femoral endarterectomy with profundoplasty and bovine pericardial patch angioplasty  1 Day Post-Op   -pt with palpable DP/PT pulses on the right. -continue asa/plavix/statin -renal function normal after aortogram yesterday -acute blood loss anemia with hgb 12.2 down from 14.2 pre-op.  Pt is tolerating. -catheter removed this morning-has not voided yet -DVT prophylaxis:  sq heparin to start today -mobilize today and will plan for discharge tomorrow    Doreatha Massed, PA-C [facility] [phone] 04/29/2021 7:11 AM  I have seen and evaluated the patient. I agree with the PA note as documented above.  Postop day 1 status post right common femoral endarterectomy with bovine patch and retrograde right common iliac artery angioplasty with stent placement for lifestyle limiting claudication.  Right groin incision looks good with no hematoma.  Palpable DP pulse in the foot.  States his foot feels better.  Out of bed and mobilize today.  Plan for discharge tomorrow.  Postop labs look okay.  Aspirin Plavix statin.  [name], MD [facility] [place] Office: [phone]

## 2021-04-29 NOTE — Evaluation (Signed)
Physical Therapy Evaluation Patient Details Name: Todd Krause MRN: [id number] DOB: 24-May-1950 Today's Date: 04/29/2021  History of Present Illness  The pt is a 70 yo male presenting for aortogram with iliac arteriogram, Right common iliac artery angioplasty with stent placement, and Right common femoral endarterectomy with profundoplasty and bovine pericardial patch angioplasty on 12/30. PMH includes: HTN, HLD, DM II, and tobacco use.   Clinical Impression  Pt in bed upon arrival of PT, agreeable to evaluation at this time. Prior to admission the pt was independent with ambulation, but limited to distances of ~20 ft due to pain and fatigue. The pt now presents with minor limitations in functional mobility, dynamic stability, and capacity for stairs due to above dx and pain, but is currently mobilizing better than his pre-surgery baseline. He was able to complete >200 ft ambulation without need for AD or assist, and completed navigation of 5 stairs with use of single rail, will continue to follow acutely to maintain strength and mobility, but no further skilled PT is needed after d/c.         Recommendations for follow up therapy are one component of a multi-disciplinary discharge planning process, led by the attending physician.  Recommendations may be updated based on patient status, additional functional criteria and insurance authorization.  Follow Up Recommendations No PT follow up    Assistance Recommended at Discharge PRN  Functional Status Assessment Patient has had a recent decline in their functional status and demonstrates the ability to make significant improvements in function in a reasonable and predictable amount of time.  Equipment Recommendations  None recommended by PT    Recommendations for Other Services       Precautions / Restrictions Precautions Precautions: Fall Restrictions Weight Bearing Restrictions: No      Mobility  Bed Mobility Overal bed  mobility: Modified Independent             General bed mobility comments: increased time, no assist    Transfers Overall transfer level: Needs assistance Equipment used: None Transfers: Sit to/from Stand Sit to Stand: Min guard           General transfer comment: minG initially, progressed to supervision. no evidence of instability, but pt reports pain at incision with initial stand    Ambulation/Gait Ambulation/Gait assistance: Supervision Gait Distance (Feet): 250 Feet Assistive device: None Gait Pattern/deviations: WFL(Within Functional Limits) Gait velocity: 0.5 m/s Gait velocity interpretation: 1.31 - 2.62 ft/sec, indicative of limited community ambulator   General Gait Details: VSS on RA, pt with no instability or LOB.  Stairs Stairs: Yes Stairs assistance: Supervision Stair Management: One rail Left;Step to pattern;Forwards Number of Stairs: 5 General stair comments: RLE leading     Balance Overall balance assessment: Mild deficits observed, not formally tested                                           Pertinent Vitals/Pain Pain Assessment: Faces Faces Pain Scale: Hurts little more Pain Location: R groin incision Pain Descriptors / Indicators: Tender Pain Intervention(s): Limited activity within patient's tolerance;Monitored during session;Repositioned    Home Living Family/patient expects to be discharged to:: Private residence Living Arrangements: Children Available Help at Discharge: Family;Available 24 hours/day Type of Home: House Home Access: Stairs to enter Entrance Stairs-Rails: Can reach both;Right;Left Entrance Stairs-Number of Steps: 5   Home Layout: One level Home Equipment: None  Prior Function Prior Level of Function : Independent/Modified Independent;Driving             Mobility Comments: No assist.  Continues to keep busy with wood shop at his home. ADLs Comments: No assist     Hand Dominance    Dominant Hand: Right    Extremity/Trunk Assessment   Upper Extremity Assessment Upper Extremity Assessment: Defer to OT evaluation    Lower Extremity Assessment Lower Extremity Assessment: Overall WFL for tasks assessed;RLE deficits/detail RLE Deficits / Details: limited by pain at incision with hip extension, but functional for all transfers    Cervical / Trunk Assessment Cervical / Trunk Assessment: Normal  Communication   Communication: No difficulties  Cognition Arousal/Alertness: Awake/alert Behavior During Therapy: WFL for tasks assessed/performed;Impulsive Overall Cognitive Status: Within Functional Limits for tasks assessed                                 General Comments: following all cues and instructions well, good safety awareness        General Comments General comments (skin integrity, edema, etc.): VSS on RA        Assessment/Plan    PT Assessment Patient needs continued PT services         PT Goals (Current goals can be found in the Care Plan section)  Acute Rehab PT Goals Patient Stated Goal: return home PT Goal Formulation: With patient Time For Goal Achievement: 05/13/21 Potential to Achieve Goals: Good     AM-PAC PT "6 Clicks" Mobility  Outcome Measure Help needed turning from your back to your side while in a flat bed without using bedrails?: None Help needed moving from lying on your back to sitting on the side of a flat bed without using bedrails?: None Help needed moving to and from a bed to a chair (including a wheelchair)?: None Help needed standing up from a chair using your arms (e.g., wheelchair or bedside chair)?: None Help needed to walk in hospital room?: None Help needed climbing 3-5 steps with a railing? : A Little 6 Click Score: 23    End of Session Equipment Utilized During Treatment: Gait belt Activity Tolerance: Patient tolerated treatment well Patient left: in chair;with call bell/phone within  reach Nurse Communication: Mobility status PT Visit Diagnosis: Other abnormalities of gait and mobility (R26.89);Pain Pain - Right/Left: Right Pain - part of body: Leg    Time: 7902-4097 PT Time Calculation (min) (ACUTE ONLY): 23 min   Charges:   PT Evaluation $PT Eval Low Complexity: 1 Low PT Treatments $Therapeutic Exercise: 8-22 mins        Vickki Muff, PT, DPT   Acute Rehabilitation Department Pager #: [phone]  Ronnie Derby 04/29/2021, 9:45 AM

## 2021-04-29 NOTE — Progress Notes (Signed)
PHARMACIST LIPID MONITORING   Todd Krause is a 70 y.o. male admitted on 04/28/2021 with PAD.  Pharmacy has been consulted to optimize lipid-lowering therapy with the indication of secondary prevention for clinical ASCVD.  Recent Labs:  Lipid Panel (last 6 months):   Lab Results  Component Value Date   CHOL 118 04/29/2021   TRIG 170 (H) 04/29/2021   HDL 30 (L) 04/29/2021   CHOLHDL 3.9 04/29/2021   VLDL 34 04/29/2021   LDLCALC 54 04/29/2021    Hepatic function panel (last 6 months):   Lab Results  Component Value Date   AST 19 04/18/2021   ALT 18 04/18/2021   ALKPHOS 74 04/18/2021   BILITOT 0.4 04/18/2021    SCr (since admission):   Serum creatinine: 0.79 mg/dL 38/75/64 3329 Estimated creatinine clearance: 80.8 mL/min  Current therapy and lipid therapy tolerance Current lipid-lowering therapy: atorvastatin 20 mg Previous lipid-lowering therapies (if applicable): none Documented or reported allergies or intolerances to lipid-lowering therapies (if applicable): none  Assessment:   Patient's levels are well controlled on current therapy of atorvastatin 20 mg with an LDL of 54 and within goal.  Plan:    1.Statin intensity (high intensity recommended for all patients regardless of the LDL):  No statin changes. The patient is taking atorvastatin 20 mg and LDL is 54. Patient is at goal.  2.Add ezetimibe (if any one of the following):   Not indicated at this time.  3.Refer to lipid clinic:   No  4.Follow-up with:  Primary care provider - Elizabeth Palau, FNP  5.Follow-up labs after discharge:  No changes in lipid therapy, repeat a lipid panel in one year.      March Rummage, PharmD PGY1 Pharmacy Resident  Please check AMION for all [facility] pharmacy phone numbers After 10:00 PM call main pharmacy [phone]

## 2021-04-29 NOTE — Progress Notes (Signed)
Occupational Therapy Treatment Patient Details Name: Todd Krause MRN: [id number] DOB: 06-Jul-1950 Today's Date: 04/29/2021   History of present illness The pt is a 70 yo male presenting for aortogram with iliac arteriogram, Right common iliac artery angioplasty with stent placement, and Right common femoral endarterectomy with profundoplasty and bovine pericardial patch angioplasty on 12/30. PMH includes: HTN, HLD, DM II, and tobacco use.   OT comments  Todd Krause has met his mod I acute OT goals, see care plan. He completed toileting, LB dressing and grooming this session with mod I for increased time due to pain in his R groin. He was mildly SOB after toileting, but recovered quickly. He has great safety awareness and insight into his deficits. Pt does not have any further acute OT needs, OT will sign off. D/c recommendation remains appropriate.    Recommendations for follow up therapy are one component of a multi-disciplinary discharge planning process, led by the attending physician.  Recommendations may be updated based on patient status, additional functional criteria and insurance authorization.    Follow Up Recommendations  No OT follow up    Assistance Recommended at Discharge PRN  Equipment Recommendations  None recommended by OT       Precautions / Restrictions Precautions Precautions: Fall Restrictions Weight Bearing Restrictions: No       Mobility Bed Mobility Overal bed mobility: Modified Independent             General bed mobility comments: increased time, no assist    Transfers Overall transfer level: Modified independent                 General transfer comment: steady, good safety awareness. +increased time for pain management.         ADL either performed or assessed with clinical judgement   ADL Overall ADL's : Modified independent     Grooming: Modified independent               Lower Body Dressing: Moderate assistance    Toilet Transfer: Moderate assistance             General ADL Comments: pt demonstrated great ability to complete mod I ADLs. He was mildly SOB after ambulating to the BR and back. He has good safety awareness. Good insight to deficits. mild pain reported at groin with sit<>stand transfers.    Extremity/Trunk Assessment Upper Extremity Assessment Upper Extremity Assessment: Overall WFL for tasks assessed   Lower Extremity Assessment Lower Extremity Assessment: Defer to PT evaluation        Vision   Vision Assessment?: No apparent visual deficits   Perception Perception Perception: Not tested   Praxis Praxis Praxis: Not tested    Cognition Arousal/Alertness: Awake/alert Behavior During Therapy: [facility] for tasks assessed/performed;Impulsive Overall Cognitive Status: Within Functional Limits for tasks assessed             General Comments: following all cues and instructions well, good safety awareness                General Comments VSS on RA, pts son present and supportive    Pertinent Vitals/ Pain       Pain Assessment: Faces Faces Pain Scale: Hurts a little bit Pain Location: R groin incision Pain Descriptors / Indicators: Tender Pain Intervention(s): Limited activity within patient's tolerance;Monitored during session   Frequency  Min 2X/week        Progress Toward Goals  OT Goals(current goals can now be found in the care plan section)  Progress towards OT goals: Goals met/education completed, patient discharged from OT  Acute Rehab OT Goals Patient Stated Goal: home to shower OT Goal Formulation: With patient Time For Goal Achievement: 05/12/21 Potential to Achieve Goals: Good ADL Goals Pt Will Perform Grooming: with modified independence;standing Pt Will Perform Lower Body Dressing: with modified independence;sit to/from stand Pt Will Transfer to Toilet: with modified independence;ambulating;regular height toilet  Plan Discharge plan  remains appropriate       AM-PAC OT "6 Clicks" Daily Activity     Outcome Measure   Help from another person eating meals?: None Help from another person taking care of personal grooming?: None Help from another person toileting, which includes using toliet, bedpan, or urinal?: None Help from another person bathing (including washing, rinsing, drying)?: None Help from another person to put on and taking off regular upper body clothing?: None Help from another person to put on and taking off regular lower body clothing?: None 6 Click Score: 24    End of Session    OT Visit Diagnosis: Unsteadiness on feet (R26.81);Pain Pain - Right/Left: Right Pain - part of body: Leg   Activity Tolerance Patient tolerated treatment well   Patient Left in bed;with call bell/phone within reach;with family/visitor present   Nurse Communication Mobility status (OT signing off)        Time: 1518-3437 OT Time Calculation (min): 18 min  Charges: OT General Charges $OT Visit: 1 Visit OT Treatments $Self Care/Home Management : 8-22 mins   Todd Krause A Loise Esguerra 04/29/2021, 2:31 PM

## 2021-04-30 ENCOUNTER — Encounter (HOSPITAL_COMMUNITY): Payer: Self-pay | Admitting: Vascular Surgery

## 2021-04-30 LAB — GLUCOSE, CAPILLARY: Glucose-Capillary: 193 mg/dL — ABNORMAL HIGH (ref 70–99)

## 2021-04-30 MED ORDER — CLOPIDOGREL BISULFATE 75 MG PO TABS: 75.0000 mg | ORAL_TABLET | Freq: Every day | ORAL | 11 refills | Status: DC

## 2021-04-30 MED ORDER — OXYCODONE-ACETAMINOPHEN 5-325 MG PO TABS: 1.0000 | ORAL_TABLET | Freq: Four times a day (QID) | ORAL | 0 refills | Status: DC | PRN

## 2021-04-30 MED ORDER — ASPIRIN 81 MG PO TBEC: 81.0000 mg | DELAYED_RELEASE_TABLET | Freq: Every day | ORAL | Status: AC

## 2021-04-30 NOTE — Progress Notes (Addendum)
Progress Note    04/30/2021 7:00 AM 2 Days Post-Op  Subjective:  no complaints; ready to go home.  Afebrile HR 50's-70's  130's-140's systolic 92% RA  Vitals:   04/29/21 2257 04/30/21 0334  BP: (!) 138/55 (!) 146/58  Pulse: 60 65  Resp: 18 17  Temp: 98.3 F (36.8 C) 98.2 F (36.8 C)  SpO2: 92% 92%    Physical Exam: Cardiac:  regular Lungs:  non labored Incisions:  clean and healing nicely Extremities:  palpable right DP/PT pulses   CBC    Component Value Date/Time   WBC 10.5 04/29/2021 0304   RBC 4.13 (L) 04/29/2021 0304   HGB 12.2 (L) 04/29/2021 0304   HGB 14.5 01/27/2021 1535   HCT 36.0 (L) 04/29/2021 0304   HCT 41.2 01/27/2021 1535   PLT 242 04/29/2021 0304   PLT 316 01/27/2021 1535   MCV 87.2 04/29/2021 0304   MCV 80 01/27/2021 1535   MCH 29.5 04/29/2021 0304   MCHC 33.9 04/29/2021 0304   RDW 13.2 04/29/2021 0304   RDW 14.3 01/27/2021 1535   LYMPHSABS 2.7 02/14/2016 0521   MONOABS 0.7 02/14/2016 0521   EOSABS 0.4 02/14/2016 0521   BASOSABS 0.0 02/14/2016 0521    BMET    Component Value Date/Time   NA 136 04/29/2021 0304   NA 135 01/27/2021 1535   K 4.5 04/29/2021 0304   CL 102 04/29/2021 0304   CO2 26 04/29/2021 0304   GLUCOSE 158 (H) 04/29/2021 0304   BUN 13 04/29/2021 0304   BUN 13 01/27/2021 1535   CREATININE 0.79 04/29/2021 0304   CREATININE 0.96 05/19/2013 1627   CALCIUM 8.9 04/29/2021 0304   GFRNONAA >60 04/29/2021 0304   GFRAA >60 02/12/2016 0519    INR    Component Value Date/Time   INR 1.1 04/18/2021 1118     Intake/Output Summary (Last 24 hours) at 04/30/2021 0700 Last data filed at 04/30/2021 0500 Gross per 24 hour  Intake 500 ml  Output 300 ml  Net 200 ml     Assessment/Plan:  71 y.o. male is s/p:  1.  Aortogram with iliac arteriogram 2.  Right common iliac artery angioplasty with stent placement (8 mm x 29 mm VBX) 3.  Right common femoral endarterectomy with profundoplasty and bovine pericardial patch  angioplasty   2 Days Post-Op   -pt continues to have palpable DP/PT pulses in the right foot.  He is doing well, ambulated and voided.  Will discharge home today and he will f/u in a 2-3 weeks with  wound check with Dr. Chestine Spore for further discussion about left sided surgery.  He will call sooner if there are any issues.   -again discussed groin wound care with pt.  -pt will continue asa/plavix/statin -DVT prophylaxis:  sq heparin   Doreatha Massed, PA-C [facility] [phone] 04/30/2021 7:00 AM  I have seen and evaluated the patient. I agree with the PA note as documented above.  Postop day 2 status post right common iliac artery angioplasty with stent placement and right common femoral endarterectomy with profundoplasty and bovine pericardial patch for lifestyle limiting claudication.  He has had an excellent result and has a palpable DP pulse in the right foot.  Right groin incisions clean dry and intact.  Plan discharge home today and his symptoms have completely resolved.  Discussed aspirin Plavix statin from my standpoint.  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  [name], MD [facility]: [phone]

## 2021-04-30 NOTE — Plan of Care (Signed)
Plan of care reviewed. Pt has been progressing.   Problem: Pt is undergone  1. Aortogram with iliac arteriogram 2.  Right common iliac artery angioplasty with stent placement (8 mm x 29 mm VBX) 3.  Right common femoral endarterectomy with profundoplasty and bovine pericardial patch angioplasty  With 2 Days Post-Op Goal: remains free from infection Outcome: Progressing: remains afebrile. Incision site on right groin id dry and clean, no hematoma or signs of infection.   Problem: Clinical Measurements: Hx of, smoker, OSA, not on CPAP,  Goal: Respiratory complications will improve Outcome: Progressing: no SOB, SPO2 92-97 on room, auscultated left upper lobe has some rhonchi, other areas are clear. Pt has strong, but not productive cough. Encouraged to use incentive spirometer effectively. Pt has no respiratory distress.    Problem: Education: pain scale 2/10. Tylenol was given.  Goal: Knowledge of General Education information will improve Description: Including pain rating scale, medication(s)/side effects and non-pharmacologic comfort measures Outcome: Progressing. Pt has been tolerated pain well. He is able to rest well tonight.   Problem: Activity:  Goal: Risk for activity intolerance will decrease Outcome: Progressing: ambulates independently in his room.   Problem: Elimination: Goal: Will not experience complications related to bowel motility and urination. Outcome: Progressing: Pt has normal BM and urination today.  We will continue to monitor. Filiberto Pinks, RN

## 2021-04-30 NOTE — Discharge Summary (Addendum)
Discharge Summary     Todd Krause 02-Feb-1951 71 y.o. male  [id number]  Admission Date: 04/28/2021  Discharge Date: 04/30/2021  Physician: Marty Heck, MD  Admission Diagnosis: PAD (peripheral artery disease) ([place]) [I73.9]  HPI:   This is a 71 y.o. male with history of hypertension, hyperlipidemia, diabetes, tobacco abuse that presents to discuss results of CTA.  He was referred by Dr. Gwenlyn Found for evaluation of bilateral lower extremity claudication.  Patient states he has had significant symptoms over the last 1.5 to 2 years.  States it has gotten much worse and he now can only walk about 25 yards before he has to stop and gets cramping in both calves.  He also describes discomfort in both hips that radiates down his legs.  Unable to get through the grocery store.  He underwent angiogram with Dr. Gwenlyn Found in the Cath Lab on 02/06/2021 through right transfemoral access.  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    Hospital Course:  The patient was admitted to the hospital and taken to the operating room on 04/28/2021 and underwent: 1.  Aortogram with iliac arteriogram 2.  Right common iliac artery angioplasty with stent placement (8 mm x 29 mm VBX) 3.  Right common femoral endarterectomy with profundoplasty and bovine pericardial patch angioplasty    Findings: The right common iliac artery had a high-grade calcified stenosis greater than 95%.  This was crossed retrograde from the right common femoral artery after the arteriotomy was performed and then stented with an 8 mm x 29 mm VBX with no residual stenosis.  The right common femoral artery also had a heavily calcified plaque with near subtotal occlusion.  Endarterectomy was carried down with eversion endarterectomy of the profunda and proximal SFA.  Bovine pericardial patch was sewn to the right common femoral artery with brisk pulsatile inflow.  Palpable PT pulse  at completion  The pt tolerated the procedure well and was transported to the PACU in good condition.   By POD 1, he was doing well.  He had not ambulated or voided but catheter was just removed.  He had palpable DP/PT pulses and incision looked good.   POD 2, pt doing well,  he has voided, walked and continues to have palpable DP/PT pulses on the right.  His incision is healing nicely.  Discussed groin wound care with pt.    CBC    Component Value Date/Time   WBC 10.5 04/29/2021 0304   RBC 4.13 (L) 04/29/2021 0304   HGB 12.2 (L) 04/29/2021 0304   HGB 14.5 01/27/2021 1535   HCT 36.0 (L) 04/29/2021 0304   HCT 41.2 01/27/2021 1535   PLT 242 04/29/2021 0304   PLT 316 01/27/2021 1535   MCV 87.2 04/29/2021 0304   MCV 80 01/27/2021 1535   MCH 29.5 04/29/2021 0304   MCHC 33.9 04/29/2021 0304   RDW 13.2 04/29/2021 0304   RDW 14.3 01/27/2021 1535   LYMPHSABS 2.7 02/14/2016 0521   MONOABS 0.7 02/14/2016 0521   EOSABS 0.4 02/14/2016 0521   BASOSABS 0.0 02/14/2016 0521    BMET    Component Value Date/Time   NA 136 04/29/2021 0304   NA 135 01/27/2021 1535   K 4.5 04/29/2021 0304   CL 102 04/29/2021 0304   CO2 26 04/29/2021 0304   GLUCOSE 158 (H) 04/29/2021 0304   BUN 13 04/29/2021 0304   BUN 13 01/27/2021 1535   CREATININE 0.79 04/29/2021  0304   CREATININE 0.96 05/19/2013 1627   CALCIUM 8.9 04/29/2021 0304   GFRNONAA >60 04/29/2021 0304   GFRAA >60 02/12/2016 0519     Discharge Instructions     Discharge patient   Complete by: As directed    Discharge disposition: 01-Home or Self Care   Discharge patient date: 04/30/2021       Discharge Diagnosis:  PAD (peripheral artery disease) ([place]) [I73.9]  Secondary Diagnosis: Patient Active Problem List   Diagnosis Date Noted   PAD (peripheral artery disease) ([place]) 04/28/2021   Peripheral arterial disease ([place]) 01/27/2021   History of tobacco use 11/21/2016   Sepsis, unspecified organism ([place]) 02/11/2016   Elevated serum  creatinine 02/11/2016   Epididymitis 02/11/2016   Sepsis ([place]) 02/11/2016   Acute kidney injury ([place])    Benign prostatic hyperplasia 05/09/2015   Type 2 diabetes mellitus without complication, with long-term current use of insulin ([place]) 01/08/2015   Hyponatremia 01/08/2015   Elevated PSA 08/25/2014   HYPERCHOLESTEROLEMIA 02/03/2009   Essential hypertension 08/26/2007   AODM 03/10/2007   ERECTILE DYSFUNCTION 01/24/2007   Osteoarthritis 01/24/2007   Past Medical History:  Diagnosis Date   Arthritis    hands, knees   BPH (benign prostatic hypertrophy)    Chronic bronchitis (HCC)    Coronary artery disease    ED (erectile dysfunction)    Elevated PSA    GERD (gastroesophageal reflux disease)    Hepatitis    History of colon polyps    History of hiatal hernia    Hyperlipidemia    Hypertension    Lower urinary tract symptoms (LUTS)    OSA (obstructive sleep apnea)    mild to moderate osa per study 02-18-2004--  no cpap   Peripheral vascular disease (HCC)    Prostate nodule    Type 2 diabetes mellitus (HCC)      Allergies as of 04/30/2021       Reactions   Latex Hives   Tramadol Rash   Codeine Other (See Comments)   constipation        Medication List     STOP taking these medications    GOODY HEADACHE PO       TAKE these medications    Accu-Chek Guide w/Device Kit Use as directed by physician to monitor blood sugars   acetaminophen 500 MG tablet Commonly known as: TYLENOL Take 1,000 mg by mouth every 6 (six) hours as needed for moderate pain.   albuterol (2.5 MG/3ML) 0.083% nebulizer solution Commonly known as: PROVENTIL Take 2.5 mg by nebulization every 6 (six) hours as needed for wheezing or shortness of breath.   Alcohol Wipes 70 % Pads Patient checks blood sugars bid   amLODipine 10 MG tablet Commonly known as: NORVASC Take 1 tablet by mouth daily.   aspirin 81 MG EC tablet Take 1 tablet (81 mg total) by mouth daily at 6 (six) AM. Swallow  whole. Start taking on: May 01, 2021   atorvastatin 20 MG tablet Commonly known as: LIPITOR Take 20 mg by mouth 3 (three) times a week.   carvedilol 12.5 MG tablet Commonly known as: COREG Take 1 tablet (12.5 mg total) by mouth 2 (two) times daily.   celecoxib 200 MG capsule Commonly known as: CELEBREX Take 1 capsule by mouth 2 (two) times daily.   clopidogrel 75 MG tablet Commonly known as: PLAVIX Take 1 tablet (75 mg total) by mouth daily at 6 (six) AM. Start taking on: May 01, 2021   finasteride 5  MG tablet Commonly known as: PROSCAR Take 1 tablet (5 mg total) by mouth daily. What changed: when to take this   metFORMIN 1000 MG tablet Commonly known as: GLUCOPHAGE Take 1,000 mg by mouth 2 (two) times daily.   multivitamin tablet Take 1 tablet by mouth every evening.   naproxen sodium 220 MG tablet Commonly known as: ALEVE Take 440 mg by mouth every 12 (twelve) hours as needed (pain).   NovoLIN 70/30 Kwikpen (70-30) 100 UNIT/ML KwikPen Generic drug: insulin isophane & regular human KwikPen 5-30 Units 2 (two) times daily before a meal. Per sliding scale: not provided   omeprazole 20 MG capsule Commonly known as: PRILOSEC Take 1 capsule (20 mg total) by mouth daily. What changed: when to take this   oxyCODONE-acetaminophen 5-325 MG tablet Commonly known as: Percocet Take 1 tablet by mouth every 6 (six) hours as needed.   Red Yeast Rice 600 MG Caps Take 600 mg by mouth every evening.   spironolactone 25 MG tablet Commonly known as: ALDACTONE Take 1 tablet (25 mg total) by mouth daily.   tamsulosin 0.4 MG Caps capsule Commonly known as: FLOMAX Take 1 capsule by mouth 2 (two) times daily.   valsartan-hydrochlorothiazide 320-25 MG tablet Commonly known as: DIOVAN-HCT Take 1 tablet by mouth daily.   vitamin B-12 1000 MCG tablet Commonly known as: CYANOCOBALAMIN Take 1,000 mcg by mouth every evening.   Vitamin D3 50 MCG (2000 UT) Tabs Take 1 capsule  by mouth every evening.        Discharge Instructions: [facility] Discharge instructions Lower Extremity Bypass Surgery  Please refer to the following instruction for your post-procedure care. Your surgeon or physician assistant will discuss any changes with you.  Activity  You are encouraged to walk as much as you can. You can slowly return to normal activities during the month after your surgery. Avoid strenuous activity and heavy lifting until your doctor tells you it's OK. Avoid activities such as vacuuming or swinging a golf club. Do not drive until your doctor give the OK and you are no longer taking prescription pain medications. It is also normal to have difficulty with sleep habits, eating and bowel movement after surgery. These will go away with time.  Bathing/Showering  You may shower after you go home. Do not soak in a bathtub, hot tub, or swim until the incision heals completely.  Incision Care  Clean your incision with mild soap and water. Shower every day. Pat the area dry with a clean towel. You do not need a bandage unless otherwise instructed. Do not apply any ointments or creams to your incision. If you have open wounds you will be instructed how to care for them or a visiting nurse may be arranged for you. If you have staples or sutures along your incision they will be removed at your post-op appointment. You may have skin glue on your incision. Do not peel it off. It will come off on its own in about one week.  Wash the groin wound with soap and water daily and pat dry. (No tub bath-only shower)  Then put a dry gauze or washcloth in the groin to keep this area dry to help prevent wound infection.  Do this daily and as needed.  Do not use Vaseline or neosporin on your incisions.  Only use soap and water on your incisions and then protect and keep dry.  Diet  Resume your normal diet. There are no special  food restrictions following this  procedure. A low fat/ low cholesterol diet is recommended for all patients with vascular disease. In order to heal from your surgery, it is CRITICAL to get adequate nutrition. Your body requires vitamins, minerals, and protein. Vegetables are the best source of vitamins and minerals. Vegetables also provide the perfect balance of protein. Processed food has little nutritional value, so try to avoid this.  Medications  Resume taking all your medications unless your doctor or Physician Assistant tells you not to. If your incision is causing pain, you may take over-the-counter pain relievers such as acetaminophen (Tylenol). If you were prescribed a stronger pain medication, please aware these medication can cause nausea and constipation. Prevent nausea by taking the medication with a snack or meal. Avoid constipation by drinking plenty of fluids and eating foods with high amount of fiber, such as fruits, vegetables, and grains. Take Colace 100 mg (an over-the-counter stool softener) twice a day as needed for constipation.  Do not take Tylenol if you are taking prescription pain medications.  Follow Up  Our office will schedule a follow up appointment 2-3 weeks following discharge.  Please call us immediately for any of the following conditions  Severe or worsening pain in your legs or feet while at rest or while walking Increase pain, redness, warmth, or drainage (pus) from your incision site(s) Fever of 101 degree or higher The swelling in your leg with the bypass suddenly worsens and becomes more painful than when you were in the hospital If you have been instructed to feel your graft pulse then you should do so every day. If you can no longer feel this pulse, call the office immediately. Not all patients are given this instruction.  Leg swelling is common after leg bypass surgery.  The swelling should improve over a few months following surgery. To improve the swelling, you may elevate your legs  above the level of your heart while you are sitting or resting. Your surgeon or physician assistant may ask you to apply an ACE wrap or wear compression (TED) stockings to help to reduce swelling.  Reduce your risk of vascular disease  Stop smoking. If you would like help call [facility] at 1-800-QUIT-NOW [phone]) or [place] at ([phone].  Manage your cholesterol Maintain a desired weight Control your diabetes weight Control your diabetes Keep your blood pressure down  If you have any questions, please call the office at [phone]   Prescriptions given: 1.  Roxicet #20 No Refill 2.  Aspirin 42m daily OTC 3.  Plavix 775mdaily #30 eleven refills  Disposition: home  Patient's condition: is Good  Follow up:  Dr. ClCarlis Abbottn 3 weeks for wound check and discussion about left sided surgery.   SaLeontine LocketPA-C [facility] [phone]/04/2021  8:02 AM  - For VQI Registry use ---   Post-op:  Wound infection: No  Graft infection: No  Transfusion: No    If yes, n/a units given New Arrhythmia: No Ipsilateral amputation: No, _0  Minor, _1  BKA, _2  AKA Discharge patency: _3  Primary, _4  Primary assisted, _5  Secondary, _6  Occluded Patency judged by: _7  Dopper only, _8  Palpable graft pulse, _9  Palpable distal pulse, _10  ABI inc. > 0.15, _11  Duplex Discharge ABI: R not done, L  D/C Ambulatory Status: Ambulatory  Complications: MI: No, <R<[id number]>_0<\/[id number]>_82Troponin only, _13  EKG or Clinical CHF: No Resp failure:No, _14  Pneumonia, _15  Ventilator  Chg in renal function: No, _0  Inc. Cr > 0.5, _1  Temp. Dialysis,  _2  Permanent dialysis Stroke: No, _3  Minor, _4  Major Return to OR: No  Reason for return to OR: _5  Bleeding, _6  Infection, _7  Thrombosis, _8  Revision  Discharge medications: Statin use:  yes ASA use:  yes Plavix use:  yes Beta blocker use: yes CCB use:  No ACEI use:   no ARB use:  yes Coumadin use: no

## 2021-05-02 ENCOUNTER — Telehealth: Payer: Self-pay | Admitting: Cardiovascular Disease

## 2021-05-02 NOTE — Telephone Encounter (Signed)
Spoke with pt who states that on yesterday morning at 0930 he became dizzy and nausea. He took his BP and it was noted to be 95/53 with Hr of 103. Pt states that he also nausea and dizziness on today while shopping in the store. Current BP is 123/58 Hr 64. No other complaints at this time. Please advise.

## 2021-05-02 NOTE — Telephone Encounter (Signed)
Pt c/o BP issue: STAT if pt c/o blurred vision, one-sided weakness or slurred speech  1. What are your last 5 BP reading?  127/56 HR 68                                                                 12:30 111/47 HR 64 137/67 HR 65 126/64 HR 72 111/47 HR 64   2. Are you having any other symptoms (ex. Dizziness, headache, blurred vision, passed out)? DIZZNESS THIS MORNING AFTER BREAKFAST  3. What is your BP issue? PT HAD SURGERY THIS PAST Friday Sherald Hess, MD.    PT WANTS TO KNOW IF HIS MEDS NEED TO BE ADJUSTED

## 2021-05-03 MED ORDER — VALSARTAN-HYDROCHLOROTHIAZIDE 320-25 MG PO TABS: 0.5000 | ORAL_TABLET | Freq: Every day | ORAL | 11 refills | Status: DC

## 2021-05-03 NOTE — Telephone Encounter (Addendum)
Pt understands to take 1/2 valsartan- HCTZ and continue to monitor BP.

## 2021-05-09 ENCOUNTER — Encounter: Payer: Self-pay | Admitting: Cardiovascular Disease

## 2021-05-09 ENCOUNTER — Ambulatory Visit (INDEPENDENT_AMBULATORY_CARE_PROVIDER_SITE_OTHER): Payer: Medicare HMO | Admitting: Cardiovascular Disease

## 2021-05-09 ENCOUNTER — Other Ambulatory Visit: Payer: Self-pay

## 2021-05-09 VITALS — BP 154/60 | HR 63 | Ht 68.0 in | Wt 182.4 lb

## 2021-05-09 DIAGNOSIS — E785 Hyperlipidemia, unspecified: Secondary | ICD-10-CM

## 2021-05-09 DIAGNOSIS — I10 Essential (primary) hypertension: Secondary | ICD-10-CM | POA: Diagnosis not present

## 2021-05-09 DIAGNOSIS — Z87891 Personal history of nicotine dependence: Secondary | ICD-10-CM

## 2021-05-09 DIAGNOSIS — E7800 Pure hypercholesterolemia, unspecified: Secondary | ICD-10-CM | POA: Diagnosis not present

## 2021-05-09 DIAGNOSIS — I739 Peripheral vascular disease, unspecified: Secondary | ICD-10-CM

## 2021-05-09 NOTE — Patient Instructions (Addendum)

## 2021-05-09 NOTE — Assessment & Plan Note (Signed)
History of hyperlipidemia on statin therapy with lipid profile performed 04/29/2021 revealing total cholesterol 118, LDL 54 and HDL 30.

## 2021-05-09 NOTE — Assessment & Plan Note (Signed)
History of symptomatic PAD with angiography performed by myself 02/06/2021 revealing a subtotally occluded right common iliac artery and high-grade right common femoral artery.  His Doppler suggested left common femoral artery stenosis as well.  I referred him to Dr. Clotilde Dieter who performed right common iliac artery stenting with right common femoral endarterectomy, profundoplasty and bovine patch angioplasty.  His right lower extremity claudication has resolved.  The plan is to perform staged left common femoral endarterectomy and patch angioplasty.  He did have a Myoview stress test done for preoperative clearance that was low risk and nonischemic.

## 2021-05-09 NOTE — Assessment & Plan Note (Signed)
History of ongoing tobacco abuse recalcitrant to perspective modification.

## 2021-05-09 NOTE — Assessment & Plan Note (Signed)
History of essential hypertension a blood pressure measured today 154/60.  He is on carvedilol, valsartan, hydrochlorothiazide and spironolactone.

## 2021-05-09 NOTE — Progress Notes (Signed)
05/09/2021 Todd Krause   October 17, 1950  [id number]  Primary Physician Vicenta Aly, [place] Primary Cardiologist: Lorretta Harp MD Lupe Carney, [place]  HPI:  Todd Krause is a 71 y.o.  mildly overweight widowed Caucasian male father of 5 children, grandfather of 6 grandchildren referred by Dr. Gardiner Rhyme  for peripheral vascular valuation because of lifestyle limiting claudication.  I last saw him in the office 02/22/2021.  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  His risk factors are notable for over 50 pack years of tobacco abuse continuing to smoke 1 pack/day as well as treated hypertension, hyperlipidemia and diabetes.  His father did have a myocardial infarction.  He is never had a heart attack or stroke.  He denies chest pain or shortness of breath.  He does have claudication left greater than right which is lifestyle limiting and which he has had for the last 3 to 4 years.  Recent Doppler studies performed 12/21/2020 revealed a right ABI of 0.59 and a left of 0.72.  He did have a high-frequency signal in his right common iliac artery and left common femoral artery.  I performed peripheral angiography on him 02/06/2021 revealing a subtotally occluded right common iliac artery, high-grade subocclusive right common femoral artery stenosis as well as Doppler study that suggested a left common femoral artery stenosis.  He underwent a hybrid procedure by Dr. Fortunato Curling April 28, 2021 with a stent to his right common iliac artery, right, common femoral endarterectomy, profundoplasty and bovine patch angioplasty.  His right lower extremity claudication has resolved.  He does have residual disease in the left.  I did a Lexiscan Myoview stress test for preoperative clearance that was low risk and nonischemic prior to his surgical procedure.  Unfortunately, he does continue to smoke.   Current Meds  Medication Sig    acetaminophen (TYLENOL) 500 MG tablet Take 1,000 mg by mouth every 6 (six) hours as needed for moderate pain.   albuterol (PROVENTIL) (2.5 MG/3ML) 0.083% nebulizer solution Take 2.5 mg by nebulization every 6 (six) hours as needed for wheezing or shortness of breath.   Alcohol Swabs (ALCOHOL WIPES) 70 % PADS Patient checks blood sugars bid   amLODipine (NORVASC) 10 MG tablet Take 1 tablet by mouth daily.   aspirin EC 81 MG EC tablet Take 1 tablet (81 mg total) by mouth daily at 6 (six) AM. Swallow whole.   atorvastatin (LIPITOR) 20 MG tablet Take 20 mg by mouth 3 (three) times a week.   Blood Glucose Monitoring Suppl (ACCU-CHEK GUIDE) w/Device KIT Use as directed by physician to monitor blood sugars   carvedilol (COREG) 12.5 MG tablet Take 1 tablet (12.5 mg total) by mouth 2 (two) times daily.   celecoxib (CELEBREX) 200 MG capsule Take 1 capsule by mouth 2 (two) times daily.   Cholecalciferol (VITAMIN D3) 2000 UNITS TABS Take 1 capsule by mouth every evening.    clopidogrel (PLAVIX) 75 MG tablet Take 1 tablet (75 mg total) by mouth daily at 6 (six) AM.   finasteride (PROSCAR) 5 MG tablet Take 1 tablet (5 mg total) by mouth daily. (Patient taking differently: Take 5 mg by mouth every evening.)   insulin isophane & regular human KwikPen (NOVOLIN 70/30 KWIKPEN) (70-30) 100 UNIT/ML KwikPen 5-30 Units 2 (two) times daily before a meal. Per sliding scale: not provided   metFORMIN (GLUCOPHAGE) 1000 MG tablet Take 1,000 mg by mouth 2 (two) times  daily.   Multiple Vitamin (MULTIVITAMIN) tablet Take 1 tablet by mouth every evening.    naproxen sodium (ANAPROX) 220 MG tablet Take 440 mg by mouth every 12 (twelve) hours as needed (pain).   omeprazole (PRILOSEC) 20 MG capsule Take 1 capsule (20 mg total) by mouth daily. (Patient taking differently: Take 20 mg by mouth 2 (two) times daily.)   oxyCODONE-acetaminophen (PERCOCET) 5-325 MG tablet Take 1 tablet by mouth every 6 (six) hours as needed.   Red Yeast Rice  600 MG CAPS Take 600 mg by mouth every evening.    spironolactone (ALDACTONE) 25 MG tablet Take 1 tablet (25 mg total) by mouth daily.   tamsulosin (FLOMAX) 0.4 MG CAPS capsule Take 1 capsule by mouth 2 (two) times daily.   valsartan-hydrochlorothiazide (DIOVAN HCT) 320-25 MG tablet Take 0.5 tablets by mouth daily.   vitamin B-12 (CYANOCOBALAMIN) 1000 MCG tablet Take 1,000 mcg by mouth every evening.      Allergies  Allergen Reactions   Latex Hives   Tramadol Rash   Codeine Other (See Comments)    constipation    Social History   Socioeconomic History   Marital status: Married    Spouse name: Not on file   Number of children: 2   Years of education: Not on file   Highest education level: Not on file  Occupational History   Occupation: Psychologist, sport and exercise (cattle)  Tobacco Use   Smoking status: Every Day    Packs/day: 1.00    Years: 30.00    Pack years: 30.00    Types: Cigarettes    Last attempt to quit: 09/23/2006    Years since quitting: 14.6   Smokeless tobacco: Never  Vaping Use   Vaping Use: Never used  Substance and Sexual Activity   Alcohol use: Yes    Comment: occasionally   Drug use: No   Sexual activity: Never  Other Topics Concern   Not on file  Social History Narrative   No health insurance   Social Determinants of Health   Financial Resource Strain: Not on file  Food Insecurity: Not on file  Transportation Needs: Not on file  Physical Activity: Not on file  Stress: Not on file  Social Connections: Not on file  Intimate Partner Violence: Not on file     Review of Systems: General: negative for chills, fever, night sweats or weight changes.  Cardiovascular: negative for chest pain, dyspnea on exertion, edema, orthopnea, palpitations, paroxysmal nocturnal dyspnea or shortness of breath Dermatological: negative for rash Respiratory: negative for cough or wheezing Urologic: negative for hematuria Abdominal: negative for nausea, vomiting, diarrhea, bright red  blood per rectum, melena, or hematemesis Neurologic: negative for visual changes, syncope, or dizziness All other systems reviewed and are otherwise negative except as noted above.    Blood pressure (!) 154/60, pulse 63, height _0  (1.727 m), weight 182 lb 6.4 oz (82.7 kg), SpO2 96 %.  General appearance: alert and no distress Neck: no adenopathy, no carotid bruit, no JVD, supple, symmetrical, trachea midline, and thyroid not enlarged, symmetric, no tenderness/mass/nodules Lungs: clear to auscultation bilaterally Heart: regular rate and rhythm, S1, S2 normal, no murmur, click, rub or gallop Extremities: extremities normal, atraumatic, no cyanosis or edema Pulses: 2+ and symmetric Skin: Skin color, texture, turgor normal. No rashes or lesions Neurologic: Grossly normal  EKG sinus rhythm at 63 with left bundle branch block.  I personally reviewed this EKG.  ASSESSMENT AND PLAN:   HYPERCHOLESTEROLEMIA History of hyperlipidemia on statin therapy  with lipid profile performed 04/29/2021 revealing total cholesterol 118, LDL 54 and HDL 30.  Essential hypertension History of essential hypertension a blood pressure measured today 154/60.  He is on carvedilol, valsartan, hydrochlorothiazide and spironolactone.  History of tobacco use History of ongoing tobacco abuse recalcitrant to perspective modification.  PAD (peripheral artery disease) ([facility]) History of symptomatic PAD with angiography performed by myself 02/06/2021 revealing a subtotally occluded right common iliac artery and high-grade right common femoral artery.  His Doppler suggested left common femoral artery stenosis as well.  I referred him to Dr. Fortunato Curling who performed right common iliac artery stenting with right common femoral endarterectomy, profundoplasty and bovine patch angioplasty.  His right lower extremity claudication has resolved.  The plan is to perform staged left common femoral endarterectomy and patch angioplasty.   He did have a Myoview stress test done for preoperative clearance that was low risk and nonischemic.     Lorretta Harp MD FACP,FACC,FAHA, [facility] 05/09/2021 4:12 PM

## 2021-05-23 ENCOUNTER — Encounter: Payer: Self-pay | Admitting: Vascular Surgery

## 2021-05-23 ENCOUNTER — Encounter (HOSPITAL_COMMUNITY): Payer: Medicare HMO

## 2021-05-23 ENCOUNTER — Other Ambulatory Visit: Payer: Self-pay

## 2021-05-23 ENCOUNTER — Ambulatory Visit (INDEPENDENT_AMBULATORY_CARE_PROVIDER_SITE_OTHER): Payer: Medicare HMO | Admitting: Vascular Surgery

## 2021-05-23 VITALS — BP 154/67 | HR 75 | Temp 97.9°F | Ht 68.0 in | Wt 179.2 lb

## 2021-05-23 DIAGNOSIS — I739 Peripheral vascular disease, unspecified: Secondary | ICD-10-CM

## 2021-05-23 NOTE — Progress Notes (Signed)
Patient name: Todd Krause MRN: [id number] DOB: Jun 08, 1950 Sex: male  REASON FOR VISIT: Post-op check  HPI: Todd Krause is a 71 y.o. male that presents for postop check after recent right common femoral endarterectomy with profundoplasty and bovine patch and retrograde right common iliac artery angioplasty and stenting with an 8 x 29 VBX on 04/28/2021 for right lower extremity short distance claudication.  He reports doing very well today.  His groin incision has healed without issue.  He says his right leg is 100% better.  He is now able to go to the store and restaurant and has no issues with claudication.  He is taking aspirin and Plavix.    He also has significant left common femoral disease but feels his left leg is not bothering him now.  Past Medical History:  Diagnosis Date   Arthritis    hands, knees   BPH (benign prostatic hypertrophy)    Chronic bronchitis (HCC)    Coronary artery disease    ED (erectile dysfunction)    Elevated PSA    GERD (gastroesophageal reflux disease)    Hepatitis    History of colon polyps    History of hiatal hernia    Hyperlipidemia    Hypertension    Lower urinary tract symptoms (LUTS)    OSA (obstructive sleep apnea)    mild to moderate osa per study 02-18-2004--  no cpap   Peripheral vascular disease ([place])    Prostate nodule    Type 2 diabetes mellitus ([place])     Past Surgical History:  Procedure Laterality Date   ABDOMINAL AORTOGRAM W/LOWER EXTREMITY N/A 02/06/2021   Procedure: ABDOMINAL AORTOGRAM W/LOWER EXTREMITY;  Surgeon: Lorretta Harp, MD;  Location: [facility];  Service: Cardiovascular;  Laterality: N/A;   AORTOGRAM N/A 04/28/2021   Procedure: AORTOGRAM WITH ILIAC ARTERIOGRAM;  Surgeon: Marty Heck, MD;  Location: [place];  Service: Vascular;  Laterality: N/A;   CARDIAC CATHETERIZATION  11-21-2009  dr Roderic Palau berry   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,  50% mCFX,  mild pulmonary  HTN/  preserved LV , ef >60%   COLONOSCOPY  last one -- april 2016   ENDARTERECTOMY FEMORAL Right 04/28/2021   Procedure: RIGHT COMMON FEMORAL ARTERY ENDARTERECTOMY;  Surgeon: Marty Heck, MD;  [facility];  Service: Vascular;  Laterality: Right;   EYE SURGERY Bilateral    iol lens for cataracts   INSERTION OF ILIAC STENT Right 04/28/2021   Procedure: RIGHT COMMON ILIAC ARTERY ANGIOPLASTY WITH INSERTION OF RIGHT ILIAC STENT;  Surgeon: Marty Heck, MD;  Location: [place];  Service: Vascular;  Laterality: Right;   melanoma removed from back  15 yrs ago   [place] Right 04/28/2021   Procedure: [place];  Surgeon: Marty Heck, MD;  Location: [place];  Service: Vascular;  Laterality: Right;   PROSTATE BIOPSY N/A 10/01/2014   Procedure: BIOPSY TRANSRECTAL ULTRASONIC PROSTATE (TUBP);  Surgeon: Carolan Clines, MD;  Location: [facility];  Service: Urology;  Laterality: N/A;   TRANSTHORACIC ECHOCARDIOGRAM  11-20-2009   grade I diastolic dysfunction/  ef 60-65%/  mild AR, MR, and TR/  trivial PR   TRANSURETHRAL RESECTION OF PROSTATE N/A 05/09/2015   Procedure: CYSTOSCOPY TRANSURETHRAL RESECTION OF THE PROSTATE (TURP);  Surgeon: Carolan Clines, MD;  Location: [facility];  Service: Urology;  Laterality: N/A;    Family History  Problem Relation Age of Onset   Diabetes Mother  Cancer Mother    Diabetes Father    Heart attack Father    Colon cancer Neg Hx    Colon polyps Neg Hx    Kidney disease Neg Hx    Gallbladder disease Neg Hx    Esophageal cancer Neg Hx     SOCIAL HISTORY: Social History   Tobacco Use   Smoking status: Every Day    Packs/day: 1.00    Years: 30.00    Pack years: 30.00    Types: Cigarettes    Last attempt to quit: 09/23/2006    Years since quitting: 14.6   Smokeless tobacco: Never  Substance Use Topics   Alcohol use: Yes    Comment: occasionally    Allergies  Allergen Reactions    Latex Hives   Tramadol Rash   Codeine Other (See Comments)    constipation    Current Outpatient Medications  Medication Sig Dispense Refill   acetaminophen (TYLENOL) 500 MG tablet Take 1,000 mg by mouth every 6 (six) hours as needed for moderate pain.     albuterol (PROVENTIL) (2.5 MG/3ML) 0.083% nebulizer solution Take 2.5 mg by nebulization every 6 (six) hours as needed for wheezing or shortness of breath.     Alcohol Swabs (ALCOHOL WIPES) 70 % PADS Patient checks blood sugars bid     amLODipine (NORVASC) 10 MG tablet Take 1 tablet by mouth daily.     aspirin EC 81 MG EC tablet Take 1 tablet (81 mg total) by mouth daily at 6 (six) AM. Swallow whole.     atorvastatin (LIPITOR) 20 MG tablet Take 20 mg by mouth 3 (three) times a week.     Blood Glucose Monitoring Suppl (ACCU-CHEK GUIDE) w/Device KIT Use as directed by physician to monitor blood sugars     carvedilol (COREG) 12.5 MG tablet Take 1 tablet (12.5 mg total) by mouth 2 (two) times daily. 180 tablet 3   celecoxib (CELEBREX) 200 MG capsule Take 1 capsule by mouth 2 (two) times daily.     Cholecalciferol (VITAMIN D3) 2000 UNITS TABS Take 1 capsule by mouth every evening.      clopidogrel (PLAVIX) 75 MG tablet Take 1 tablet (75 mg total) by mouth daily at 6 (six) AM. 30 tablet 11   finasteride (PROSCAR) 5 MG tablet Take 1 tablet (5 mg total) by mouth daily. (Patient taking differently: Take 5 mg by mouth every evening.) 30 tablet 1   insulin isophane & regular human KwikPen (NOVOLIN 70/30 KWIKPEN) (70-30) 100 UNIT/ML KwikPen 5-30 Units 2 (two) times daily before a meal. Per sliding scale: not provided     metFORMIN (GLUCOPHAGE) 1000 MG tablet Take 1,000 mg by mouth 2 (two) times daily.     Multiple Vitamin (MULTIVITAMIN) tablet Take 1 tablet by mouth every evening.      naproxen sodium (ANAPROX) 220 MG tablet Take 440 mg by mouth every 12 (twelve) hours as needed (pain).     omeprazole (PRILOSEC) 20 MG capsule Take 1 capsule (20 mg  total) by mouth daily. (Patient taking differently: Take 20 mg by mouth 2 (two) times daily.) 90 capsule 0   oxyCODONE-acetaminophen (PERCOCET) 5-325 MG tablet Take 1 tablet by mouth every 6 (six) hours as needed. 20 tablet 0   Red Yeast Rice 600 MG CAPS Take 600 mg by mouth every evening.      spironolactone (ALDACTONE) 25 MG tablet Take 1 tablet (25 mg total) by mouth daily. 90 tablet 3   tamsulosin (FLOMAX) 0.4 MG CAPS capsule  Take 1 capsule by mouth 2 (two) times daily.     valsartan-hydrochlorothiazide (DIOVAN HCT) 320-25 MG tablet Take 0.5 tablets by mouth daily. 15 tablet 11   vitamin B-12 (CYANOCOBALAMIN) 1000 MCG tablet Take 1,000 mcg by mouth every evening.      No current facility-administered medications for this visit.    REVIEW OF SYSTEMS:  _0  denotes positive finding, _1  denotes negative finding Cardiac  Comments:  Chest pain or chest pressure:    Shortness of breath upon exertion:    Short of breath when lying flat:    Irregular heart rhythm:        Vascular    Pain in calf, thigh, or hip brought on by ambulation:    Pain in feet at night that wakes you up from your sleep:     Blood clot in your veins:    Leg swelling:         Pulmonary    Oxygen at home:    Productive cough:     Wheezing:         Neurologic    Sudden weakness in arms or legs:     Sudden numbness in arms or legs:     Sudden onset of difficulty speaking or slurred speech:    Temporary loss of vision in one eye:     Problems with dizziness:         Gastrointestinal    Blood in stool:     Vomited blood:         Genitourinary    Burning when urinating:     Blood in urine:        Psychiatric    Major depression:         Hematologic    Bleeding problems:    Problems with blood clotting too easily:        Skin    Rashes or ulcers:        Constitutional    Fever or chills:      PHYSICAL EXAM: Vitals:   05/23/21 1533  BP: (!) 154/67  Pulse: 75  Temp: 97.9 F (36.6 C)  Weight:  179 lb 3.2 oz (81.3 kg)  Height: _2  (1.727 m)    GENERAL: The patient is a well-nourished male, in no acute distress. The vital signs are documented above. CARDIAC: There is a regular rate and rhythm.  VASCULAR:  Right groin incision well-healed Right femoral pulse palpable Right DP 2+ palpable Left femoral pulse palpable Left DP 1+ palpable  DATA:   None  Assessment/Plan:  71 year old male presents for postop check after recent right common femoral artery endarterectomy with profundoplasty and bovine pericardial patch with retrograde right common iliac stenting on 04/28/2021 for lifestyle limiting claudication.  His groin incision has healed nicely and he has a palpable femoral pulse and dorsalis pedis pulse in the right foot.  He is having no issues with claudication.  Very pleased with his progress.  Discussed he stay on aspirin Plavix from my standpoint.  I discussed potential left leg intervention given significant common femoral disease on the left.  He now feels that his left leg is not really causing any significant symptoms.  We will continue observation for now.  He has follow-up with Dr. Gwenlyn Found in 6 months.  I will allow Dr. Gwenlyn Found to do his future surveillance.  Discussed with the patient he let me know or Dr. Gwenlyn Found let me know if he needs future intervention on the left leg  being a common femoral endarterectomy.   Marty Heck, MD [facility] Office: [phone]

## 2021-06-19 NOTE — Progress Notes (Signed)
[facility] Office Note:    Date:  06/21/2021   ID:  Todd Krause, Todd Krause 02-12-1951, MRN [id number]  PCP:  Vicenta Aly, [place]  Cardiologist:  Donato Heinz, MD  Electrophysiologist:  None   Referring MD: Vicenta Aly, [place]   Chief Complaint  Patient presents with   PAD     History of Present Illness:    Todd Krause is a 71 y.o. male with a hx of PAD, hypertension, hyperlipidemia, OSA, tobacco use T2DM  who presents for follow-up.  He was referred by Vicenta Aly, NP for evaluation of hypertension and leg pain, initially seen 11/2020.  He reports that he has been having pain and swelling in his left leg.  Reports symptoms developed last year after he had a fall.  He underwent x-ray which showed no fracture and underwent lower extremity duplex which showed no DVT.  Currently reports he gets pain in the back of his leg when he walks, resolves with rest.  He denies any chest pain, dyspnea, lightheadedness, syncope, or palpitations.  Reports he does not exercise due to his leg pain.  He smokes 1.5 packs/day.  Family history includes father had MI in 72s, thinks his son has A. fib.  ABIs 12/21/2020 showed moderate disease on right (0.59) and left (0.72).  Lower extremity arterial duplex showed greater than 50% stenosis of right common iliac artery, 75 to 99% stenosis in left common femoral artery.  He was referred to Dr. Gwenlyn Found and underwent lower extremity angiography which showed totally occluded right common iliac artery and 99% stenosis of right common femoral artery.  Was referred to vascular surgery. Underwent right common iliac artery stenting and right common femoral endarterectomy with Dr. Carlis Abbott 04/28/2021.  Echocardiogram 12/28/2020 showed EF 50 to 55%, septal dyskinesis consistent with bundle branch block, grade 1 diastolic dysfunction, normal RV function, no significant valvular disease.   Lexiscan Myoview on 03/02/2021 showed normal perfusion  (though significant artifact), EF 48%  Since last clinic visit, he reports that he is doing much better.  Denies any recent claudication symptoms.  Denies any chest pain, dyspnea, lightheadedness, syncope, lower extremity edema, or palpitations.  He walks about every day for 15 to 20 minutes.  He denies any bleeding issues on aspirin or Plavix.  He continues to smoke, 0.5 packs/day.   Past Medical History:  Diagnosis Date   Arthritis    hands, knees   BPH (benign prostatic hypertrophy)    Chronic bronchitis (HCC)    Coronary artery disease    ED (erectile dysfunction)    Elevated PSA    GERD (gastroesophageal reflux disease)    Hepatitis    History of colon polyps    History of hiatal hernia    Hyperlipidemia    Hypertension    Lower urinary tract symptoms (LUTS)    OSA (obstructive sleep apnea)    mild to moderate osa per study 02-18-2004--  no cpap   Peripheral vascular disease ([place])    Prostate nodule    Type 2 diabetes mellitus ([place])     Past Surgical History:  Procedure Laterality Date   ABDOMINAL AORTOGRAM W/LOWER EXTREMITY N/A 02/06/2021   Procedure: ABDOMINAL AORTOGRAM W/LOWER EXTREMITY;  Surgeon: Lorretta Harp, MD;  Location: [place] [facility];  Service: Cardiovascular;  Laterality: N/A;   AORTOGRAM N/A 04/28/2021   Procedure: AORTOGRAM WITH ILIAC ARTERIOGRAM;  Surgeon: Marty Heck, MD;  Location: [place];  Service: Vascular;  Laterality: N/A;   CARDIAC  CATHETERIZATION  11-21-2009  dr Roderic Palau berry   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,  50% mCFX,  mild pulmonary HTN/  preserved LV , ef >60%   COLONOSCOPY  last one -- april 2016   ENDARTERECTOMY FEMORAL Right 04/28/2021   Procedure: RIGHT COMMON FEMORAL ARTERY ENDARTERECTOMY;  Surgeon: Marty Heck, MD;  Location: [facility];  Service: Vascular;  Laterality: Right;   EYE SURGERY Bilateral    iol lens for cataracts   INSERTION OF ILIAC STENT Right 04/28/2021   Procedure: RIGHT COMMON ILIAC  ARTERY ANGIOPLASTY WITH INSERTION OF RIGHT ILIAC STENT;  Surgeon: Marty Heck, MD;  Location: [place];  Service: Vascular;  Laterality: Right;   melanoma removed from back  15 yrs ago   [place] Right 04/28/2021   Procedure: [place];  Surgeon: Marty Heck, MD;  Location: [place];  Service: Vascular;  Laterality: Right;   PROSTATE BIOPSY N/A 10/01/2014   Procedure: BIOPSY TRANSRECTAL ULTRASONIC PROSTATE (TUBP);  Surgeon: Carolan Clines, MD;  Location: [facility];  Service: Urology;  Laterality: N/A;   TRANSTHORACIC ECHOCARDIOGRAM  11-20-2009   grade I diastolic dysfunction/  ef 60-65%/  mild AR, MR, and TR/  trivial PR   TRANSURETHRAL RESECTION OF PROSTATE N/A 05/09/2015   Procedure: CYSTOSCOPY TRANSURETHRAL RESECTION OF THE PROSTATE (TURP);  Surgeon: Carolan Clines, MD;  [facility];  Service: Urology;  Laterality: N/A;    Current Medications: Current Meds  Medication Sig   acetaminophen (TYLENOL) 500 MG tablet Take 1,000 mg by mouth every 6 (six) hours as needed for moderate pain.   albuterol (PROVENTIL) (2.5 MG/3ML) 0.083% nebulizer solution Take 2.5 mg by nebulization every 6 (six) hours as needed for wheezing or shortness of breath.   Alcohol Swabs (ALCOHOL WIPES) 70 % PADS Patient checks blood sugars bid   amLODipine (NORVASC) 10 MG tablet Take 1 tablet by mouth daily.   aspirin EC 81 MG EC tablet Take 1 tablet (81 mg total) by mouth daily at 6 (six) AM. Swallow whole.   atorvastatin (LIPITOR) 20 MG tablet Take 20 mg by mouth 3 (three) times a week.   Blood Glucose Monitoring Suppl (ACCU-CHEK GUIDE) w/Device KIT Use as directed by physician to monitor blood sugars   carvedilol (COREG) 12.5 MG tablet Take 1 tablet (12.5 mg total) by mouth 2 (two) times daily.   celecoxib (CELEBREX) 200 MG capsule Take 1 capsule by mouth 2 (two) times daily.   Cholecalciferol (VITAMIN D3) 2000 UNITS TABS Take 1 capsule by mouth  every evening.    clopidogrel (PLAVIX) 75 MG tablet Take 1 tablet (75 mg total) by mouth daily at 6 (six) AM.   finasteride (PROSCAR) 5 MG tablet Take 1 tablet (5 mg total) by mouth daily. (Patient taking differently: Take 5 mg by mouth every evening.)   insulin isophane & regular human KwikPen (NOVOLIN 70/30 KWIKPEN) (70-30) 100 UNIT/ML KwikPen 5-30 Units 2 (two) times daily before a meal. Per sliding scale: not provided   metFORMIN (GLUCOPHAGE) 1000 MG tablet Take 1,000 mg by mouth 2 (two) times daily.   Multiple Vitamin (MULTIVITAMIN) tablet Take 1 tablet by mouth every evening.    naproxen sodium (ANAPROX) 220 MG tablet Take 440 mg by mouth every 12 (twelve) hours as needed (pain).   nicotine (NICODERM CQ - DOSED IN MG/24 HOURS) 14 mg/24hr patch Place 1 patch (14 mg total) onto the skin daily.   nicotine (NICODERM CQ - DOSED IN MG/24  HOURS) 21 mg/24hr patch Place 1 patch (21 mg total) onto the skin daily.   nicotine (NICODERM CQ - DOSED IN MG/24 HR) 7 mg/24hr patch Place 1 patch (7 mg total) onto the skin daily.   omeprazole (PRILOSEC) 20 MG capsule Take 1 capsule (20 mg total) by mouth daily. (Patient taking differently: Take 20 mg by mouth 2 (two) times daily.)   oxyCODONE-acetaminophen (PERCOCET) 5-325 MG tablet Take 1 tablet by mouth every 6 (six) hours as needed.   Red Yeast Rice 600 MG CAPS Take 600 mg by mouth every evening.    spironolactone (ALDACTONE) 25 MG tablet Take 1 tablet (25 mg total) by mouth daily.   tamsulosin (FLOMAX) 0.4 MG CAPS capsule Take 1 capsule by mouth 2 (two) times daily.   valsartan-hydrochlorothiazide (DIOVAN HCT) 320-25 MG tablet Take 0.5 tablets by mouth daily.   vitamin B-12 (CYANOCOBALAMIN) 1000 MCG tablet Take 1,000 mcg by mouth every evening.      Allergies:   Latex, Tramadol, and Codeine   Social History   Socioeconomic History   Marital status: Married    Spouse name: Not on file   Number of children: 2   Years of education: Not on file    Highest education level: Not on file  Occupational History   Occupation: Psychologist, sport and exercise (cattle)  Tobacco Use   Smoking status: Every Day    Packs/day: 1.00    Years: 30.00    Pack years: 30.00    Types: Cigarettes    Last attempt to quit: 09/23/2006    Years since quitting: 14.7   Smokeless tobacco: Never  Vaping Use   Vaping Use: Never used  Substance and Sexual Activity   Alcohol use: Yes    Comment: occasionally   Drug use: No   Sexual activity: Never  Other Topics Concern   Not on file  Social History Narrative   No health insurance   Social Determinants of Health   Financial Resource Strain: Not on file  Food Insecurity: Not on file  Transportation Needs: Not on file  Physical Activity: Not on file  Stress: Not on file  Social Connections: Not on file     Family History: The patient's family history includes Cancer in his mother; Diabetes in his father and mother; Heart attack in his father. There is no history of Colon cancer, Colon polyps, Kidney disease, Gallbladder disease, or Esophageal cancer.  ROS:   Please see the history of present illness.     All other systems reviewed and are negative.  EKGs/Labs/Other Studies Reviewed:    The following studies were reviewed today:   EKG:  EKG is  ordered today.  The ekg ordered today demonstrates sinus rhythm, rate 59, left bundle branch block  Recent Labs: 04/18/2021: ALT 18 04/29/2021: BUN 13; Creatinine, Ser 0.79; Hemoglobin 12.2; Platelets 242; Potassium 4.5; Sodium 136  Recent Lipid Panel    Component Value Date/Time   CHOL 118 04/29/2021 0304   TRIG 170 (H) 04/29/2021 0304   HDL 30 (L) 04/29/2021 0304   CHOLHDL 3.9 04/29/2021 0304   VLDL 34 04/29/2021 0304   LDLCALC 54 04/29/2021 0304   LDLDIRECT 67.7 06/09/2010 1147    Physical Exam:    VS:  BP 122/60    Pulse 71    Ht $R'5\' 8"'mO$  (1.727 m)    Wt 177 lb 6.4 oz (80.5 kg)    SpO2 99%    BMI 26.97 kg/m     Wt Readings from Last 3 Encounters:  06/21/21 177  lb 6.4 oz (80.5 kg)  05/23/21 179 lb 3.2 oz (81.3 kg)  05/09/21 182 lb 6.4 oz (82.7 kg)     GEN:  Well nourished, well developed in no acute distress HEENT: Normal NECK: No JVD; No carotid bruits LYMPHATICS: No lymphadenopathy CARDIAC: RRR, no murmurs, rubs, gallops RESPIRATORY:  Clear to auscultation without rales, wheezing or rhonchi  ABDOMEN: Soft, non-tender, non-distended MUSCULOSKELETAL:  No edema; No deformity  SKIN: Warm and dry NEUROLOGIC:  Alert and oriented x 3 PSYCHIATRIC:  Normal affect   ASSESSMENT:    1. Peripheral arterial disease ([place])   2. LBBB (left bundle branch block)   3. Essential hypertension   4. Tobacco use     PLAN:    PAD: ABIs 12/21/2020 showed moderate disease on right (0.59) and left (0.72).  Lower extremity arterial duplex showed greater than 50% stenosis of right common iliac artery, 75 to 99% stenosis in left common femoral artery.  He was referred to Dr. Gwenlyn Found and underwent lower extremity angiography which showed totally occluded right common iliac artery and 99% stenosis of right common femoral artery.  Was referred to vascular surgery. Underwent right common iliac artery stenting and right common femoral endarterectomy with Dr. Carlis Abbott 04/28/2021.  Claudication symptoms resolved with intervention on right, Dr. Carlis Abbott recommended monitoring left and can consider common femoral endarterectomy if develops symptoms -Continue aspirin, Plavix -Continue atorvastatin  LBBB: Echocardiogram 12/28/2020 showed EF 50 to 55%, septal dyskinesis consistent with bundle branch block, grade 1 diastolic dysfunction, normal RV function, no significant valvular disease.   Lexiscan Myoview on 03/02/2021 showed normal perfusion (though significant artifact), EF 48%  Hypertension: On valsartan-HCTZ 160-12.5 mg daily, amlodipine 10 mg daily, carvedilol 12.5 mg twice daily, spironolactone 25 mg daily  Hyperlipidemia: On atorvastatin 20 mg 3 times weekly.  LDL 54 on  04/29/2021.  T2DM: On metformin, insulin.  A1c 7.4% 04/28/21  Tobacco use: Patient counseled on the risk of tobacco use and cessation strongly encouraged.  He would like to try nicotine patches to aid in cessation, will prescribe  RTC in 6 months  Medication Adjustments/Labs and Tests Ordered: Current medicines are reviewed at length with the patient today.  Concerns regarding medicines are outlined above.  No orders of the defined types were placed in this encounter.   Meds ordered this encounter  Medications   nicotine (NICODERM CQ - DOSED IN MG/24 HOURS) 21 mg/24hr patch    Sig: Place 1 patch (21 mg total) onto the skin daily.    Dispense:  28 patch    Refill:  0   nicotine (NICODERM CQ - DOSED IN MG/24 HOURS) 14 mg/24hr patch    Sig: Place 1 patch (14 mg total) onto the skin daily.    Dispense:  28 patch    Refill:  0   nicotine (NICODERM CQ - DOSED IN MG/24 HR) 7 mg/24hr patch    Sig: Place 1 patch (7 mg total) onto the skin daily.    Dispense:  28 patch    Refill:  0     Patient Instructions  Medication Instructions:  NICOTINE PATCH TAPER- PLEASE FOLLOW INSTRUCTIONS ON PACKAGING   *If you need a refill on your cardiac medications before your next appointment, please call your pharmacy*  Follow-Up: At [facility], you and your health needs are our priority.  As part of our continuing mission to provide you with exceptional heart care, we have created designated Provider Care Teams.  These Care Teams include your  primary Cardiologist (physician) and Advanced Practice Providers (APPs -  Physician Assistants and Nurse Practitioners) who all work together to provide you with the care you need, when you need it.  Your next appointment:   6 month(s)  The format for your next appointment:   In Person  Provider:   Donato Heinz, MD     Signed, Donato Heinz, MD  06/21/2021 5:09 PM    [place]

## 2021-06-21 ENCOUNTER — Other Ambulatory Visit: Payer: Self-pay

## 2021-06-21 ENCOUNTER — Ambulatory Visit: Payer: Medicare HMO | Admitting: Cardiology

## 2021-06-21 ENCOUNTER — Encounter: Payer: Self-pay | Admitting: Cardiology

## 2021-06-21 VITALS — BP 122/60 | HR 71 | Ht 68.0 in | Wt 177.4 lb

## 2021-06-21 DIAGNOSIS — I447 Left bundle-branch block, unspecified: Secondary | ICD-10-CM

## 2021-06-21 DIAGNOSIS — Z720 Tobacco use: Secondary | ICD-10-CM | POA: Diagnosis not present

## 2021-06-21 DIAGNOSIS — I739 Peripheral vascular disease, unspecified: Secondary | ICD-10-CM

## 2021-06-21 DIAGNOSIS — E785 Hyperlipidemia, unspecified: Secondary | ICD-10-CM

## 2021-06-21 DIAGNOSIS — I10 Essential (primary) hypertension: Secondary | ICD-10-CM | POA: Diagnosis not present

## 2021-06-21 MED ORDER — NICOTINE 7 MG/24HR TD PT24
7.0000 mg | MEDICATED_PATCH | Freq: Every day | TRANSDERMAL | 0 refills | Status: DC
Start: 2021-06-21 — End: 2022-01-17

## 2021-06-21 MED ORDER — NICOTINE 14 MG/24HR TD PT24
14.0000 mg | MEDICATED_PATCH | Freq: Every day | TRANSDERMAL | 0 refills | Status: DC
Start: 2021-06-21 — End: 2022-01-17

## 2021-06-21 MED ORDER — NICOTINE 21 MG/24HR TD PT24: 21.0000 mg | MEDICATED_PATCH | Freq: Every day | TRANSDERMAL | 0 refills | Status: DC

## 2021-06-21 NOTE — Patient Instructions (Addendum)
Medication Instructions:  NICOTINE PATCH TAPER- PLEASE FOLLOW INSTRUCTIONS ON PACKAGING   *If you need a refill on your cardiac medications before your next appointment, please call your pharmacy*  Follow-Up: At [facility], you and your health needs are our priority.  As part of our continuing mission to provide you with exceptional heart care, we have created designated Provider Care Teams.  These Care Teams include your primary Cardiologist (physician) and Advanced Practice Providers (APPs -  Physician Assistants and Nurse Practitioners) who all work together to provide you with the care you need, when you need it.  Your next appointment:   6 month(s)  The format for your next appointment:   In Person  Provider:   Little Ishikawa, MD

## 2021-08-30 ENCOUNTER — Other Ambulatory Visit: Payer: Self-pay

## 2021-08-30 MED ORDER — SPIRONOLACTONE 25 MG PO TABS: 25.0000 mg | ORAL_TABLET | Freq: Every day | ORAL | 3 refills | Status: DC

## 2021-09-04 ENCOUNTER — Other Ambulatory Visit: Payer: Self-pay

## 2021-09-04 MED ORDER — SPIRONOLACTONE 25 MG PO TABS: 25.0000 mg | ORAL_TABLET | Freq: Every day | ORAL | 3 refills | Status: DC

## 2021-11-30 ENCOUNTER — Other Ambulatory Visit: Payer: Self-pay | Admitting: Cardiology

## 2021-12-20 ENCOUNTER — Telehealth: Payer: Self-pay | Admitting: *Deleted

## 2021-12-20 NOTE — Telephone Encounter (Signed)
   Pre-operative Risk Assessment    Patient Name: Todd Krause  DOB: 09-Aug-1950 MRN: [id number]     Request for Surgical Clearance    Procedure:  Dental Extraction - Amount of Teeth to be Pulled:  7 TEETH FOR EXTRACTION, ALVEOLOPLASTY, REMOVAL MAXILLARY AND MANDIBULAR BUCCAL EXOSTOSES, REMOVAL MANDIBULAR LINGUAL TORUS  Date of Surgery:  Clearance TBD                                 Surgeon:  DR. Ocie Doyne, DMD Surgeon's Group or Practice Name:  [facility] Doyne, D[facility] Phone number:  [phone] Fax number:  [phone]   Type of Clearance Requested:   - Medical  - Pharmacy:  Hold Clopidogrel (Plavix) AS WELL AS IF THE PT WILL REQUIRE SBE   Type of Anesthesia:  General    Additional requests/questions:    Elpidio Anis   12/20/2021, 9:56 AM

## 2021-12-20 NOTE — Telephone Encounter (Signed)
His Plavix may be held for 5 days prior to his procedure.  He does not require SBE prophylaxis.  Preoperative team, please contact this patient and set up a phone call appointment for further cardiac evaluation.  Thank you for your help.  Thomasene Ripple. Juwana Thoreson NP-C    12/20/2021, 1:09 PM [facility] [address] Office [phone] Fax [phone]

## 2021-12-22 ENCOUNTER — Telehealth: Payer: Self-pay | Admitting: *Deleted

## 2021-12-22 NOTE — Telephone Encounter (Signed)
Pt agreeable to plan of care for televisit pre op 12/27/21 @ 2:20. Med rec and consent are done.     Patient Consent for Virtual Visit        Todd Krause has provided verbal consent on 12/22/2021 for a virtual visit (video or telephone).   CONSENT FOR VIRTUAL VISIT FOR:  Todd Krause  By participating in this virtual visit I agree to the following:  I hereby voluntarily request, consent and authorize [facility] and its employed or contracted physicians, physician assistants, nurse practitioners or other licensed health care professionals (the Practitioner), to provide me with telemedicine health care services (the "Services") as deemed necessary by the treating Practitioner. I acknowledge and consent to receive the Services by the Practitioner via telemedicine. I understand that the telemedicine visit will involve communicating with the Practitioner through live audiovisual communication technology and the disclosure of certain medical information by electronic transmission. I acknowledge that I have been given the opportunity to request an in-person assessment or other available alternative prior to the telemedicine visit and am voluntarily participating in the telemedicine visit.  I understand that I have the right to withhold or withdraw my consent to the use of telemedicine in the course of my care at any time, without affecting my right to future care or treatment, and that the Practitioner or I may terminate the telemedicine visit at any time. I understand that I have the right to inspect all information obtained and/or recorded in the course of the telemedicine visit and may receive copies of available information for a reasonable fee.  I understand that some of the potential risks of receiving the Services via telemedicine include:  Delay or interruption in medical evaluation due to technological equipment failure or disruption; Information transmitted may not be  sufficient (e.g. poor resolution of images) to allow for appropriate medical decision making by the Practitioner; and/or  In rare instances, security protocols could fail, causing a breach of personal health information.  Furthermore, I acknowledge that it is my responsibility to provide information about my medical history, conditions and care that is complete and accurate to the best of my ability. I acknowledge that Practitioner's advice, recommendations, and/or decision may be based on factors not within their control, such as incomplete or inaccurate data provided by me or distortions of diagnostic images or specimens that may result from electronic transmissions. I understand that the practice of medicine is not an exact science and that Practitioner makes no warranties or guarantees regarding treatment outcomes. I acknowledge that a copy of this consent can be made available to me via my patient portal [facility] MyChart), or I can request a printed copy by calling the office of [facility].    I understand that my insurance will be billed for this visit.   I have read or had this consent read to me. I understand the contents of this consent, which adequately explains the benefits and risks of the Services being provided via telemedicine.  I have been provided ample opportunity to ask questions regarding this consent and the Services and have had my questions answered to my satisfaction. I give my informed consent for the services to be provided through the use of telemedicine in my medical care

## 2021-12-22 NOTE — Telephone Encounter (Signed)
Pt agreeable to plan of care for televisit pre op 12/27/21 @ 2:20. Med rec and consent are done.

## 2021-12-27 ENCOUNTER — Ambulatory Visit: Payer: Medicare HMO | Attending: Cardiology | Admitting: Physician Assistant

## 2021-12-27 DIAGNOSIS — Z01810 Encounter for preprocedural cardiovascular examination: Secondary | ICD-10-CM | POA: Diagnosis not present

## 2021-12-27 NOTE — Progress Notes (Addendum)
Virtual Visit via Telephone Note   Because of Todd Krause's co-morbid illnesses, he is at least at moderate risk for complications without adequate follow up.  This format is felt to be most appropriate for this patient at this time.  The patient did not have access to video technology/had technical difficulties with video requiring transitioning to audio format only (telephone).  All issues noted in this document were discussed and addressed.  No physical exam could be performed with this format.  Please refer to the patient's chart for his consent to telehealth for [facility].  Evaluation Performed:  Preoperative cardiovascular risk assessment _____________   Date:  12/27/2021   Patient ID:  Todd Krause, DOB 23-Aug-1950, MRN [id number] Patient Location:  Home Provider location:   Office  Primary Care Provider:  Elizabeth Palau, FNP Primary Cardiologist:  Little Ishikawa, MD  Chief Complaint / Patient Profile   71 y.o. y/o male with a h/o   Peripheral arterial disease S/p R CIA stent and R CFA endarterectomy 03/2021 (Dr. Chestine Spore) Hypertension  Hyperlipidemia  OSA +Cigs Diabetes mellitus  Echocardiogram 8/22: nl EF, no sig valve dz Myoview 02/2021: nl perfusion  who is pending Procedure:  Dental Extraction - Amount of Teeth to be Pulled:  7 TEETH FOR EXTRACTION, ALVEOLOPLASTY, REMOVAL MAXILLARY AND MANDIBULAR BUCCAL EXOSTOSES, REMOVAL MANDIBULAR LINGUAL TORUS Date of Surgery:  Clearance TBD                              Surgeon:  DR. Ocie Doyne, DMD Surgeon's Group or Practice Name:  [facility] D[facility]ne, DMD Phone number:  [phone] Fax number:  [phone] Type of Clearance Requested:   - Medical  - Pharmacy:  Hold Clopidogrel (Plavix) AS WELL AS IF THE PT WILL REQUIRE SBE Type of Anesthesia:  General   He presents today for telephonic preoperative cardiovascular risk assessment.  Past Medical History    Past Medical History:   Diagnosis Date   Arthritis    hands, knees   BPH (benign prostatic hypertrophy)    Chronic bronchitis (HCC)    Coronary artery disease    ED (erectile dysfunction)    Elevated PSA    GERD (gastroesophageal reflux disease)    Hepatitis    History of colon polyps    History of hiatal hernia    Hyperlipidemia    Hypertension    Lower urinary tract symptoms (LUTS)    OSA (obstructive sleep apnea)    mild to moderate osa per study 02-18-2004--  no cpap   Peripheral vascular disease (HCC)    Prostate nodule    Type 2 diabetes mellitus (HCC)    Past Surgical History:  Procedure Laterality Date   ABDOMINAL AORTOGRAM W/LOWER EXTREMITY N/A 02/06/2021   Procedure: ABDOMINAL AORTOGRAM W/LOWER EXTREMITY;  Surgeon: [name], MD;  Location: [facility];  Service: Cardiovascular;  Laterality: N/A;   AORTOGRAM N/A 04/28/2021   Procedure: AORTOGRAM WITH ILIAC ARTERIOGRAM;  Surgeon: [name], MD;  [facility] OR;  Service: Vascular;  Laterality: N/A;   CARDIAC CATHETERIZATION  11-21-2009  dr Christiane Ha berry   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,  50% mCFX,  mild pulmonary HTN/  preserved LV , ef >60%   COLONOSCOPY  last one -- april 2016   ENDARTERECTOMY FEMORAL Right 04/28/2021   Procedure: RIGHT COMMON FEMORAL ARTERY ENDARTERECTOMY;  Surgeon: [name], MD;  Location: [facility] OR;  Service: Vascular;  Laterality: Right;   EYE SURGERY Bilateral    iol lens for cataracts   INSERTION OF ILIAC STENT Right 04/28/2021   Procedure: RIGHT COMMON ILIAC ARTERY ANGIOPLASTY WITH INSERTION OF RIGHT ILIAC STENT;  Surgeon: Marty Heck, MD;  Location: [place];  Service: Vascular;  Laterality: Right;   melanoma removed from back  15 yrs ago   [place] Right 04/28/2021   Procedure: [place];  Surgeon: Marty Heck, MD;  Location: [place];  Service: Vascular;  Laterality: Right;   PROSTATE BIOPSY N/A 10/01/2014    Procedure: BIOPSY TRANSRECTAL ULTRASONIC PROSTATE (TUBP);  Surgeon: Carolan Clines, MD;  Location: [facility];  Service: Urology;  Laterality: N/A;   TRANSTHORACIC ECHOCARDIOGRAM  11-20-2009   grade I diastolic dysfunction/  ef 60-65%/  mild AR, MR, and TR/  trivial PR   TRANSURETHRAL RESECTION OF PROSTATE N/A 05/09/2015   Procedure: CYSTOSCOPY TRANSURETHRAL RESECTION OF THE PROSTATE (TURP);  Surgeon: Carolan Clines, MD;  Location: [facility];  Service: Urology;  Laterality: N/A;    Allergies  Allergies  Allergen Reactions   Latex Hives   Tramadol Rash   Codeine Other (See Comments)    constipation    History of Present Illness    ABDOU Krause is a 71 y.o. male who presents via audio/video conferencing for a telehealth visit today.  Pt was last seen in cardiology clinic on 2.22.23 by Dr. Gardiner Rhyme.  At that time ODUS CLASBY was doing well.  The patient is now pending procedure as outlined above. Since his last visit, he is doing well without chest pain, shortness of breath, syncope.   Home Medications    Prior to Admission medications   Medication Sig Start Date End Date Taking? Authorizing Provider  acetaminophen (TYLENOL) 500 MG tablet Take 1,000 mg by mouth every 6 (six) hours as needed for moderate pain.    [provider]  albuterol (PROVENTIL) (2.5 MG/3ML) 0.083% nebulizer solution Take 2.5 mg by nebulization every 6 (six) hours as needed for wheezing or shortness of breath.    [provider]  Alcohol Swabs (ALCOHOL WIPES) 70 % PADS Patient checks blood sugars bid 03/16/20   [provider]  amLODipine (NORVASC) 10 MG tablet Take 1 tablet by mouth daily. 12/02/20   [provider]  aspirin EC 81 MG EC tablet Take 1 tablet (81 mg total) by mouth daily at 6 (six) AM. Swallow whole. 05/01/21   Rhyne, Hulen Shouts, PA-C  atorvastatin (LIPITOR) 20 MG tablet Take 20 mg by mouth 3 (three) times a week. 02/08/20    [provider]  Blood Glucose Monitoring Suppl (ACCU-CHEK GUIDE) w/Device KIT Use as directed by physician to monitor blood sugars 01/26/20   [provider]  carvedilol (COREG) 12.5 MG tablet TAKE 1 TABLET TWICE DAILY. STOP METOPROLOL (TOPROL XL) 11/30/21   Donato Heinz, MD  celecoxib (CELEBREX) 200 MG capsule Take 1 capsule by mouth 2 (two) times daily. 09/07/20   [provider]  Cholecalciferol (VITAMIN D3) 2000 UNITS TABS Take 1 capsule by mouth every evening.     [provider]  clopidogrel (PLAVIX) 75 MG tablet Take 1 tablet (75 mg total) by mouth daily at 6 (six) AM. 05/01/21   Rhyne, Hulen Shouts, PA-C  finasteride (PROSCAR) 5 MG tablet Take 1 tablet (5 mg total) by mouth daily. Patient taking differently: Take 5 mg by mouth every evening. 01/11/15   Gherghe, Vella Redhead,  MD  insulin isophane & regular human KwikPen (NOVOLIN 70/30 KWIKPEN) (70-30) 100 UNIT/ML KwikPen 5-30 Units 2 (two) times daily before a meal. Per sliding scale: not provided 12/13/20   [provider]  JARDIANCE 10 MG TABS tablet Take 10 mg by mouth daily. 10/30/21   [provider]  metFORMIN (GLUCOPHAGE) 1000 MG tablet Take 1,000 mg by mouth 2 (two) times daily. 08/03/14   [provider]  Multiple Vitamin (MULTIVITAMIN) tablet Take 1 tablet by mouth every evening.     [provider]  naproxen sodium (ANAPROX) 220 MG tablet Take 440 mg by mouth every 12 (twelve) hours as needed (pain).    [provider]  nicotine (NICODERM CQ - DOSED IN MG/24 HOURS) 14 mg/24hr patch Place 1 patch (14 mg total) onto the skin daily. Patient not taking: Reported on 12/22/2021 06/21/21   Donato Heinz, MD  nicotine (NICODERM CQ - DOSED IN MG/24 HOURS) 21 mg/24hr patch Place 1 patch (21 mg total) onto the skin daily. Patient not taking: Reported on 12/22/2021 06/21/21   Donato Heinz, MD  nicotine (NICODERM CQ - DOSED IN MG/24 HR) 7 mg/24hr patch  Place 1 patch (7 mg total) onto the skin daily. Patient not taking: Reported on 12/22/2021 06/21/21   Donato Heinz, MD  omeprazole (PRILOSEC) 20 MG capsule Take 1 capsule (20 mg total) by mouth daily. Patient taking differently: Take 20 mg by mouth 2 (two) times daily. 05/13/13   Renato Shin, MD  oxyCODONE-acetaminophen (PERCOCET) 5-325 MG tablet Take 1 tablet by mouth every 6 (six) hours as needed. 04/30/21   Rhyne, Hulen Shouts, PA-C  Red Yeast Rice 600 MG CAPS Take 600 mg by mouth every evening.     [provider]  spironolactone (ALDACTONE) 25 MG tablet Take 1 tablet (25 mg total) by mouth daily. 09/04/21   Donato Heinz, MD  tamsulosin (FLOMAX) 0.4 MG CAPS capsule Take 1 capsule by mouth 2 (two) times daily. 11/01/20   [provider]  valsartan-hydrochlorothiazide (DIOVAN HCT) 320-25 MG tablet Take 0.5 tablets by mouth daily. 05/03/21   Lorretta Harp, MD  vitamin B-12 (CYANOCOBALAMIN) 1000 MCG tablet Take 1,000 mcg by mouth every evening.     [provider]    Physical Exam    Vital Signs:  HILDING QUINTANAR does not have vital signs available for review today.   Given telephonic nature of communication, physical exam is limited. AAOx3. NAD. Normal affect.  Speech and respirations are unlabored.  Accessory Clinical Findings    None  Assessment & Plan    1.  Preoperative Cardiovascular Risk Assessment:   Mr. Savard's perioperative risk of a major cardiac event is 0.9% according to the Revised Cardiac Risk Index (RCRI).  Therefore, he is at low risk for perioperative complications.   His functional capacity is good at 4.31 METs according to the Duke Activity Status Index (DASI). Recommendations: According to ACC/AHA guidelines, no further cardiovascular testing needed.  The patient may proceed to surgery at acceptable risk.   Antiplatelet and/or Anticoagulation Recommendations: The patient should remain on Aspirin without  interruption.   Clopidogrel (Plavix) can be held for 5 days prior to his surgery and resumed as soon as possible post op. The patient does not require antibiotic prophylaxis.    A copy of this note will be routed to requesting surgeon.  Time:   Today, I have spent 5 minutes with the patient with telehealth technology discussing medical history, symptoms, and  management plan.     Richardson Dopp, PA-C  12/27/2021, 2:46 PM

## 2022-01-17 ENCOUNTER — Encounter: Payer: Self-pay | Admitting: Physician Assistant

## 2022-01-17 ENCOUNTER — Ambulatory Visit: Payer: Medicare HMO | Attending: Physician Assistant | Admitting: Nurse Practitioner

## 2022-01-17 VITALS — BP 140/80 | HR 60 | Ht 68.0 in | Wt 204.6 lb

## 2022-01-17 DIAGNOSIS — Z01810 Encounter for preprocedural cardiovascular examination: Secondary | ICD-10-CM | POA: Diagnosis not present

## 2022-01-17 DIAGNOSIS — Z01818 Encounter for other preprocedural examination: Secondary | ICD-10-CM | POA: Diagnosis not present

## 2022-01-17 DIAGNOSIS — I10 Essential (primary) hypertension: Secondary | ICD-10-CM

## 2022-01-17 DIAGNOSIS — E785 Hyperlipidemia, unspecified: Secondary | ICD-10-CM | POA: Diagnosis not present

## 2022-01-17 DIAGNOSIS — I447 Left bundle-branch block, unspecified: Secondary | ICD-10-CM

## 2022-01-17 DIAGNOSIS — I739 Peripheral vascular disease, unspecified: Secondary | ICD-10-CM

## 2022-01-17 NOTE — Progress Notes (Deleted)
Admits to recent stress

## 2022-01-17 NOTE — Patient Instructions (Signed)
Medication Instructions:  Your physician recommends that you continue on your current medications as directed. Please refer to the Current Medication list given to you today.  *If you need a refill on your cardiac medications before your next appointment, please call your pharmacy*  Lab Work: NONE ordered at this time of appointment   If you have labs (blood work) drawn today and your tests are completely normal, you will receive your results only by: MyChart Message (if you have MyChart) OR A paper copy in the mail If you have any lab test that is abnormal or we need to change your treatment, we will call you to review the results.  Testing/Procedures: NONE ordered at this time of appointment   Follow-Up: At [facility], you and your health needs are our priority.  As part of our continuing mission to provide you with exceptional heart care, we have created designated Provider Care Teams.  These Care Teams include your primary Cardiologist (physician) and Advanced Practice Providers (APPs -  Physician Assistants and Nurse Practitioners) who all work together to provide you with the care you need, when you need it.  Your next appointment:   6 month(s)  The format for your next appointment:   In Person  Provider:   Asim L Schumann, MD     Other Instructions  Important Information About Sugar       

## 2022-01-17 NOTE — Progress Notes (Addendum)
Cardiology Office Note:    Date:  01/21/2022   ID:  Todd Krause, DOB 04-Feb-1951, MRN [id number]  PCP:  Vicenta Aly, [place] Providers Cardiologist:  Donato Heinz, MD     Referring MD: Vicenta Aly, FNP   CC: Preoperative cardiovascular risk assessment and regular follow-up   History of Present Illness:    Todd Krause is a 71 y.o. male with a hx of the following:  Hypertension PAD, s/p R CIA stent and R CFA endarterectomy 03/2021 (Dr. Carlis Abbott) Type 2 diabetes HLD History of tobacco use OSA  Previous cardiovascular history includes status post right CIA stent and R CFA endarterectomy for his peripheral arterial disease in 2022 by Dr. Carlis Abbott.  Echocardiogram in 2022 revealed normal EF, no significant valve disease Myoview in 2022 revealed normal perfusion.  Last seen by Richardson Dopp, PA on December 27, 2021 for preoperative clearance for dental extraction.  At this visit, he was doing well from a cardiac perspective and denied any cardiac complaints.  Followed by [facility].  Today he presents for preoperative cardiovascular risk assessment and follow-up. He says that he has not had the dental procedure since last seen in the cardiology office.  He says he called Dr. Lupita Leash office, and Dr. Hoyt Koch stated the office is waiting on preoperative cardiovascular risk assessment from the cardiology office and PCP office.  States he is going to have 7 teeth removed and eventually will have surgery around the gumline, trimming bones.  Procedure will include dental extraction-7 teeth removed, alveoloplasty, removal maxillary and mandibular buccal exostoses, removal of mandibular lingual torus. Date of surgery is TBD.  Overall he is doing well from a cardiac perspective.  Denies any chest pain, shortness of breath, palpitations, swelling, PND, orthopnea, dizziness, presyncope, syncope, or claudication.  Denies any issues of hematuria, hemoptysis,  melena, or any acute bleeding.  He stays very active and enjoys woodworking and builds bird houses occasionally.  Denies any other questions or concerns today.  Past Medical History:  Diagnosis Date   Arthritis    hands, knees   BPH (benign prostatic hypertrophy)    Chronic bronchitis (HCC)    Coronary artery disease    ED (erectile dysfunction)    Elevated PSA    GERD (gastroesophageal reflux disease)    Hepatitis    History of colon polyps    History of hiatal hernia    Hyperlipidemia    Hypertension    Lower urinary tract symptoms (LUTS)    OSA (obstructive sleep apnea)    mild to moderate osa per study 02-18-2004--  no cpap   Peripheral vascular disease ([place])    Prostate nodule    Type 2 diabetes mellitus ([place])     Past Surgical History:  Procedure Laterality Date   ABDOMINAL AORTOGRAM W/LOWER EXTREMITY N/A 02/06/2021   Procedure: ABDOMINAL AORTOGRAM W/LOWER EXTREMITY;  Surgeon: Lorretta Harp, MD;  Location: [facility];  Service: Cardiovascular;  Laterality: N/A;   AORTOGRAM N/A 04/28/2021   Procedure: AORTOGRAM WITH ILIAC ARTERIOGRAM;  Surgeon: Marty Heck, MD;  Location: [facility] OR;  Service: Vascular;  Laterality: N/A;   CARDIAC CATHETERIZATION  11-21-2009  dr Roderic Palau berry   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,  50% mCFX,  mild pulmonary HTN/  preserved LV , ef >60%   COLONOSCOPY  last one -- april 2016   ENDARTERECTOMY FEMORAL Right 04/28/2021   Procedure: RIGHT COMMON FEMORAL ARTERY ENDARTERECTOMY;  Surgeon: Monica Martinez  J, MD;  Location: [facility];  Service: Vascular;  Laterality: Right;   EYE SURGERY Bilateral    iol lens for cataracts   INSERTION OF ILIAC STENT Right 04/28/2021   Procedure: RIGHT COMMON ILIAC ARTERY ANGIOPLASTY WITH INSERTION OF RIGHT ILIAC STENT;  Surgeon: Marty Heck, MD;  Location: [facility];  Service: Vascular;  Laterality: Right;   melanoma removed from back  15 yrs ago   [place] Right 04/28/2021    Procedure: [place];  Surgeon: Marty Heck, MD;  Location: [facility];  Service: Vascular;  Laterality: Right;   PROSTATE BIOPSY N/A 10/01/2014   Procedure: BIOPSY TRANSRECTAL ULTRASONIC PROSTATE (TUBP);  Surgeon: Carolan Clines, MD;  Location: [facility];  Service: Urology;  Laterality: N/A;   TRANSTHORACIC ECHOCARDIOGRAM  11-20-2009   grade I diastolic dysfunction/  ef 60-65%/  mild AR, MR, and TR/  trivial PR   TRANSURETHRAL RESECTION OF PROSTATE N/A 05/09/2015   Procedure: CYSTOSCOPY TRANSURETHRAL RESECTION OF THE PROSTATE (TURP);  Surgeon: Carolan Clines, MD;  Location: [facility];  Service: Urology;  Laterality: N/A;    Current Medications: Current Meds  Medication Sig   acetaminophen (TYLENOL) 500 MG tablet Take 1,000 mg by mouth every 6 (six) hours as needed for moderate pain.   albuterol (PROVENTIL) (2.5 MG/3ML) 0.083% nebulizer solution Take 2.5 mg by nebulization every 6 (six) hours as needed for wheezing or shortness of breath.   Alcohol Swabs (ALCOHOL WIPES) 70 % PADS Patient checks blood sugars bid   amLODipine (NORVASC) 10 MG tablet Take 1 tablet by mouth daily.   aspirin EC 81 MG EC tablet Take 1 tablet (81 mg total) by mouth daily at 6 (six) AM. Swallow whole.   atorvastatin (LIPITOR) 20 MG tablet Take 20 mg by mouth 3 (three) times a week.   Blood Glucose Monitoring Suppl (ACCU-CHEK GUIDE) w/Device KIT Use as directed by physician to monitor blood sugars   carvedilol (COREG) 12.5 MG tablet TAKE 1 TABLET TWICE DAILY. STOP METOPROLOL (TOPROL XL)   celecoxib (CELEBREX) 200 MG capsule Take 1 capsule by mouth 2 (two) times daily.   Cholecalciferol (VITAMIN D3) 2000 UNITS TABS Take 1 capsule by mouth every evening.    clopidogrel (PLAVIX) 75 MG tablet Take 1 tablet (75 mg total) by mouth daily at 6 (six) AM.   finasteride (PROSCAR) 5 MG tablet Take 1 tablet (5 mg total) by mouth daily. (Patient taking differently: Take 5 mg by mouth  every evening.)   insulin isophane & regular human KwikPen (NOVOLIN 70/30 KWIKPEN) (70-30) 100 UNIT/ML KwikPen 5-30 Units 2 (two) times daily before a meal. Per sliding scale: not provided   JARDIANCE 10 MG TABS tablet Take 10 mg by mouth daily.   metFORMIN (GLUCOPHAGE) 1000 MG tablet Take 1,000 mg by mouth 2 (two) times daily.   Multiple Vitamin (MULTIVITAMIN) tablet Take 1 tablet by mouth every evening.    naproxen sodium (ANAPROX) 220 MG tablet Take 440 mg by mouth every 12 (twelve) hours as needed (pain).   omeprazole (PRILOSEC) 20 MG capsule Take 1 capsule (20 mg total) by mouth daily. (Patient taking differently: Take 20 mg by mouth 2 (two) times daily.)   oxyCODONE-acetaminophen (PERCOCET) 5-325 MG tablet Take 1 tablet by mouth every 6 (six) hours as needed.   Red Yeast Rice 600 MG CAPS Take 600 mg by mouth every evening.    spironolactone (ALDACTONE) 25 MG tablet Take 1 tablet (25 mg total) by mouth daily.  tamsulosin (FLOMAX) 0.4 MG CAPS capsule Take 1 capsule by mouth 2 (two) times daily.   valsartan-hydrochlorothiazide (DIOVAN HCT) 320-25 MG tablet Take 0.5 tablets by mouth daily.   vitamin B-12 (CYANOCOBALAMIN) 1000 MCG tablet Take 1,000 mcg by mouth every evening.      Allergies:   Latex, Tramadol, and Codeine   Social History   Socioeconomic History   Marital status: Married    Spouse name: Not on file   Number of children: 2   Years of education: Not on file   Highest education level: Not on file  Occupational History   Occupation: Psychologist, sport and exercise (cattle)  Tobacco Use   Smoking status: Former    Packs/day: 1.00    Years: 30.00    Total pack years: 30.00    Types: Cigarettes    Quit date: 09/23/2006    Years since quitting: 15.3   Smokeless tobacco: Never  Vaping Use   Vaping Use: Never used  Substance and Sexual Activity   Alcohol use: Yes    Comment: occasionally   Drug use: No   Sexual activity: Never  Other Topics Concern   Not on file  Social History Narrative    No health insurance   Social Determinants of Health   Financial Resource Strain: Not on file  Food Insecurity: Not on file  Transportation Needs: Not on file  Physical Activity: Not on file  Stress: Not on file  Social Connections: Not on file     Family History: The patient's family history includes Cancer in his mother; Diabetes in his father and mother; Heart attack in his father. There is no history of Colon cancer, Colon polyps, Kidney disease, Gallbladder disease, or Esophageal cancer.  ROS:   Review of Systems  Constitutional: Negative.   HENT: Negative.    Eyes: Negative.   Respiratory: Negative.    Cardiovascular: Negative.   Gastrointestinal: Negative.   Genitourinary: Negative.   Musculoskeletal: Negative.   Skin: Negative.   Neurological: Negative.   Endo/Heme/Allergies: Negative.   Psychiatric/Behavioral: Negative.      Please see the history of present illness.    All other systems reviewed and are negative.  EKGs/Labs/Other Studies Reviewed:    The following studies were reviewed today:   EKG:  EKG is ordered today.  The ekg ordered today demonstrates  SR, 60 bpm, with fusion complexes, LBBB, otherwise nothing acute.   Nuclear medicine stress test on March 02, 2021:   The study is normal. The study is low risk.   No ST deviation was noted.   LV perfusion is normal. There is no evidence of ischemia. There is no evidence of infarction.   Left ventricular function is normal. Nuclear stress EF: 48 %. The left ventricular ejection fraction is mildly decreased (45-54%). End diastolic cavity size is mildly enlarged.   Prior study not available for comparison.   Overall, suspect this is likely a normal study without evidence of ischemia or infarction. There are significant artifacts described below.  There are reduced counts in the inferior segments consistent with diaphragm attenuation as the wall motion is normal in this region.  There are reduced  counts in the anterior and septum on rest imaging that improve on stress imaging. Suspect this is related to significant reduced counts in the myocardium due to gut uptake.  Normal LVEF for this modality, 48%.  This is a low-risk study.   2D echocardiogram on December 28, 2020:  1. Septal dyskinesis consistent with bundle branch block. Left  ventricular ejection fraction, by estimation, is 50 to 55%. The left  ventricle has low normal function. The left ventricle demonstrates  regional wall motion abnormalities (see scoring  diagram/findings for description). Left ventricular diastolic parameters  are consistent with Grade I diastolic dysfunction (impaired relaxation).   2. Right ventricular systolic function is normal. The right ventricular  size is normal. There is normal pulmonary artery systolic pressure.   3. The mitral valve is normal in structure. No evidence of mitral valve  regurgitation. No evidence of mitral stenosis.   4. The aortic valve is tricuspid. Aortic valve regurgitation is not  visualized. No aortic stenosis is present.   Vascular ultrasound lower extremity arterial bilateral on December 21, 2020: Right: >50% stenosis of the common iliac artery. The right external iliac  artery demonstrates dampened, monophasic flow, consistent with a more  proximal high-grade stenosis vs. occlusion. Difficult to determine exact  location of disease due to bowel gas  and flash artifact, however it is most likely in the distal common iliac  vs. origin of the external iliac artery.   Bulky, calcific plaque in the common femoral artery; unable to rule out  stenosis in this area.   Left: 75-99% stenosis noted in the common femoral artery.   ABIs on December 21, 2020: Summary:  Right: Resting right ankle-brachial index indicates moderate right lower  extremity arterial disease. The right toe-brachial index is abnormal.   Left: Resting left ankle-brachial index indicates moderate left  lower  extremity arterial disease. The left toe-brachial index is abnormal.   Vascular ultrasound lower extremity venous left (DVT) on March 19, 2020: Summary:  RIGHT:  - No evidence of deep vein thrombosis in the lower extremity. No indirect  evidence of obstruction proximal to the inguinal ligament.    LEFT:  - There is no evidence of deep vein thrombosis in the lower extremity.    - No cystic structure found in the popliteal fossa.   Recent Labs: 04/18/2021: ALT 18 04/29/2021: BUN 13; Creatinine, Ser 0.79; Hemoglobin 12.2; Platelets 242; Potassium 4.5; Sodium 136  Recent Lipid Panel    Component Value Date/Time   CHOL 118 04/29/2021 0304   TRIG 170 (H) 04/29/2021 0304   HDL 30 (L) 04/29/2021 0304   CHOLHDL 3.9 04/29/2021 0304   VLDL 34 04/29/2021 0304   LDLCALC 54 04/29/2021 0304   LDLDIRECT 67.7 06/09/2010 1147     Risk Assessment/Calculations:     The 10-year ASCVD risk score (Arnett DK, et al., 2019) is: 50.5%   Values used to calculate the score:     Age: 1 years     Sex: Male     Is Non-Hispanic African American: No     Diabetic: Yes     Tobacco smoker: No     Systolic Blood Pressure: 569 mmHg     Is BP treated: Yes     HDL Cholesterol: 30 mg/dL     Total Cholesterol: 144 mg/dL         Physical Exam:    VS:  BP (!) 140/80 (BP Location: Left Arm, Patient Position: Sitting, Cuff Size: Normal)   Pulse 60   Ht _0  (1.727 m)   Wt 204 lb 9.6 oz (92.8 kg)   SpO2 95%   BMI 31.11 kg/m     Wt Readings from Last 3 Encounters:  01/17/22 204 lb 9.6 oz (92.8 kg)  06/21/21 177 lb 6.4 oz (80.5 kg)  05/23/21 179 lb 3.2 oz (81.3 kg)  Vitals:   01/17/22 1534 01/17/22 1629  BP: (!) 174/80 (!) 140/80  Pulse: 60   SpO2: 95%      GEN: Well nourished, well developed in no acute distress HEENT: Normal NECK: No JVD; No carotid bruits CARDIAC: RRR, no murmurs, rubs, gallops; 2+ peripheral pulses throughout, strong and equal bilaterally RESPIRATORY:   Clear and diminished to auscultation without rales, wheezing or rhonchi  MUSCULOSKELETAL:  No edema; No deformity  SKIN: Warm and dry NEUROLOGIC:  Alert and oriented x 3 PSYCHIATRIC:  Normal affect   ASSESSMENT:    1. Preoperative cardiovascular examination   2. PAD (peripheral artery disease) ([place])   3. Hyperlipidemia, unspecified hyperlipidemia type   4. Hypertension, unspecified type   5. LBBB (left bundle branch block)    PLAN:    In order of problems listed above:  Preoperative Cardiovascular Risk Assessment: Mr. Standifer's RCRI score is 1.  Perioperative risk of major cardiac event is 0.9%.  Therefore, he is at low risk for perioperative complications.  His functional capacity is excellent at 7.59 METs according to the Duke activity status index score, which is is 39.45.   Recommendations: According to ACC/AHA guidelines, no further cardiovascular testing needed.  The patient may proceed to surgery at acceptable risk.   Antiplatelet and/or Anticoagulation Recommendations: The patient should remain on Aspirin without interruption.   Clopidogrel (Plavix) can be held for 5 days prior to his surgery and resumed as soon as possible post op. This patient does not require antibiotic prophylaxis.  A copy of this note will be routed to the requesting surgeon.   2. PAD, s/p R CIA stent and R CFA endarterectomy 03/2021 (Dr. Carlis Abbott), hyperlipidemia - chronic, stable Denies any claudication symptoms.  Recommend patient should remain on aspirin without interruption in the perioperative period as mentioned above. Plavix can be held for 5 days prior to his surgery and resumed as soon as possible postop as mentioned above.  LDL was 54 in December 2022.  Continue Lipitor and continue to follow up with Dr. Gwenlyn Found in 1 month.  2. HTN - chronic  BP on arrival 174/80-he admits this is due to exertion and trying to get to his appointment today.  Repeat manual BP was 140/80.  States BP well controlled  at home.  Discussed logging his BP and discussed to monitor BP at home at least 2 hours after medications and sitting for 5-10 minutes.  He will let me know in 2 weeks if SBP is consistently elevated greater than 130.  Plan to titrate BP medication if SBP consistently elevated. Continue current medication regimen.   3. LBBB - chronic, stable Left bundle branch block noted on past several EKGs.  Echocardiogram in 2022 revealed LVEF 50 to 55%, septal dyskinesis consistent with bundle branch block, left ventricle had low normal function regional wall motion abnormalities noted, grade 1 diastolic dysfunction.  Myoview in 2022 showed normal perfusion.  Completely asymptomatic.  We will continue to monitor.  4. Disposition: Follow-up with Dr. Gwenlyn Found in 1 month and follow-up with Dr. Gardiner Rhyme in 6 months or sooner if anything changes.  Medication Adjustments/Labs and Tests Ordered: Current medicines are reviewed at length with the patient today.  Concerns regarding medicines are outlined above.  Orders Placed This Encounter  Procedures   EKG 12-Lead   No orders of the defined types were placed in this encounter.   Patient Instructions  Medication Instructions:  Your physician recommends that you continue on your current medications as directed. Please  refer to the Current Medication list given to you today.  *If you need a refill on your cardiac medications before your next appointment, please call your pharmacy*  Lab Work: NONE ordered at this time of appointment   If you have labs (blood work) drawn today and your tests are completely normal, you will receive your results only by: [place] (if you have MyChart) OR A paper copy in the mail If you have any lab test that is abnormal or we need to change your treatment, we will call you to review the results.  Testing/Procedures: NONE ordered at this time of appointment   Follow-Up: At [facility], you and your health needs  are our priority.  As part of our continuing mission to provide you with exceptional heart care, we have created designated Provider Care Teams.  These Care Teams include your primary Cardiologist (physician) and Advanced Practice Providers (APPs -  Physician Assistants and Nurse Practitioners) who all work together to provide you with the care you need, when you need it.    Your next appointment:   6 month(s)  The format for your next appointment:   In Person  Provider:   Donato Heinz, MD     Other Instructions   Important Information About Sugar         Signed, Finis Bud, NP  01/21/2022 3:43 PM    [place]

## 2022-02-11 ENCOUNTER — Other Ambulatory Visit: Payer: Self-pay | Admitting: Physician Assistant

## 2022-02-20 ENCOUNTER — Ambulatory Visit: Payer: Medicare HMO | Attending: Cardiovascular Disease | Admitting: Cardiovascular Disease

## 2022-02-20 ENCOUNTER — Encounter: Payer: Self-pay | Admitting: Cardiovascular Disease

## 2022-02-20 DIAGNOSIS — I739 Peripheral vascular disease, unspecified: Secondary | ICD-10-CM | POA: Diagnosis not present

## 2022-02-20 NOTE — Patient Instructions (Signed)
Medication Instructions:  Your physician recommends that you continue on your current medications as directed. Please refer to the Current Medication list given to you today.  *If you need a refill on your cardiac medications before your next appointment, please call your pharmacy*   Testing/Procedures: Your physician has requested that you have an Aorta/Iliac Duplex. This will be take place at [place], [address].  No food after 11PM the night before.  Water is OK. (Don't drink liquids if you have been instructed not to for ANOTHER test) Avoid foods that produce bowel gas, for 24 hours prior to exam (see below). No breakfast, no chewing gum, no smoking or carbonated beverages. Patient may take morning medications with water. Come in for test at least 15 minutes early to register.  Your physician has requested that you have a lower extremity arterial duplex. This test is an ultrasound of the arteries in the legs. It looks at arterial blood flow in the legs. Allow one hour for Lower Arterial scans. There are no restrictions or special instructions. This procedure will be done at [place]  Your physician has requested that you have an ankle brachial index (ABI). During this test an ultrasound and blood pressure cuff are used to evaluate the arteries that supply the arms and legs with blood. Allow thirty minutes for this exam. There are no restrictions or special instructions. This procedure will be done at [place]. [address]    Follow-Up: At [facility], you and your health needs are our priority.  As part of our continuing mission to provide you with exceptional heart care, we have created designated Provider Care Teams.  These Care Teams include your primary Cardiologist (physician) and Advanced Practice Providers (APPs -  Physician Assistants and Nurse Practitioners) who all work together to provide you with the care you need, when you need  it.  We recommend signing up for the patient portal called "MyChart".  Sign up information is provided on this After Visit Summary.  MyChart is used to connect with patients for Virtual Visits (Telemedicine).  Patients are able to view lab/test results, encounter notes, upcoming appointments, etc.  Non-urgent messages can be sent to your provider as well.   To learn more about what you can do with MyChart, go to [email or web address].    Your next appointment:   We will see you on an as needed basis.   Provider:   Quay Burow, MD

## 2022-02-20 NOTE — Assessment & Plan Note (Signed)
Todd Krause was referred to me by Dr. Gardiner Rhyme for symptomatic PAD.  I performed peripheral angiography on him 02/06/2021 revealing a subtotally occluded right common iliac artery and right common femoral artery.  He underwent a hybrid procedure by Dr. Carlis Abbott 04/28/2021 with a stent to his right common iliac artery and right common femoral endarterectomy with profundoplasty and bovine patch angioplasty.  His claudication has essentially resolved.  He has no left lower extremity symptoms.  I am going to get aortoiliac and lower extremity arterial Doppler studies to demonstrate improvement in blood flow and will see him back on as-needed basis.

## 2022-02-20 NOTE — Progress Notes (Signed)
02/20/2022 Todd Krause   1951/01/12  [id number]  Primary Physician Vicenta Aly, [place] Primary Cardiologist: Lorretta Harp MD Lupe Carney, [place]  HPI:  Todd Krause is a 71 y.o.   mildly overweight widowed Caucasian male father of 5 children, grandfather of 6 grandchildren referred by Dr. Gardiner Rhyme  for peripheral vascular valuation because of lifestyle limiting claudication.  I last saw him in the office 05/09/2021.  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  His risk factors are notable for over 50 pack years of tobacco abuse continuing to smoke 1 pack/day as well as treated hypertension, hyperlipidemia and diabetes.  His father did have a myocardial infarction.  He is never had a heart attack or stroke.  He denies chest pain or shortness of breath.  He does have claudication left greater than right which is lifestyle limiting and which he has had for the last 3 to 4 years.  Recent Doppler studies performed 12/21/2020 revealed a right ABI of 0.59 and a left of 0.72.  He did have a high-frequency signal in his right common iliac artery and left common femoral artery.  I performed peripheral angiography on him 02/06/2021 revealing a subtotally occluded right common iliac artery, high-grade subocclusive right common femoral artery stenosis as well as Doppler study that suggested a left common femoral artery stenosis.  He underwent a hybrid procedure by Dr. Fortunato Curling April 28, 2021 with a stent to his right common iliac artery, right, common femoral endarterectomy, profundoplasty and bovine patch angioplasty.  His right lower extremity claudication has resolved.  He does have residual disease in the left.  I did a Lexiscan Myoview stress test for preoperative clearance that was low risk and nonischemic prior to his surgical procedure.  He actually quit smoking back in February of this year.  Since his vascular  procedure by Dr. Carlis Abbott on 04/28/2021 his right lower extremity claudication is completely resolved.  He has no symptoms in the left.  He has not had lower extremity arterial Doppler studies and follow-up however.  He denies chest pain or shortness of breath.  He did stop smoking.  Current Meds  Medication Sig   acetaminophen (TYLENOL) 500 MG tablet Take 1,000 mg by mouth every 6 (six) hours as needed for moderate pain.   albuterol (PROVENTIL) (2.5 MG/3ML) 0.083% nebulizer solution Take 2.5 mg by nebulization every 6 (six) hours as needed for wheezing or shortness of breath.   Alcohol Swabs (ALCOHOL WIPES) 70 % PADS Patient checks blood sugars bid   amLODipine (NORVASC) 10 MG tablet Take 1 tablet by mouth daily.   aspirin EC 81 MG EC tablet Take 1 tablet (81 mg total) by mouth daily at 6 (six) AM. Swallow whole.   atorvastatin (LIPITOR) 20 MG tablet Take 20 mg by mouth 3 (three) times a week.   Blood Glucose Monitoring Suppl (ACCU-CHEK GUIDE) w/Device KIT Use as directed by physician to monitor blood sugars   carvedilol (COREG) 12.5 MG tablet TAKE 1 TABLET TWICE DAILY. STOP METOPROLOL (TOPROL XL)   celecoxib (CELEBREX) 200 MG capsule Take 1 capsule by mouth 2 (two) times daily.   Cholecalciferol (VITAMIN D3) 2000 UNITS TABS Take 1 capsule by mouth every evening.    clopidogrel (PLAVIX) 75 MG tablet TAKE 1 TABLET EVERY DAY  AT  6AM   finasteride (PROSCAR) 5 MG tablet Take 1 tablet (5 mg total) by mouth daily. (Patient taking differently: Take  5 mg by mouth every evening.)   insulin isophane & regular human KwikPen (NOVOLIN 70/30 KWIKPEN) (70-30) 100 UNIT/ML KwikPen 5-30 Units 2 (two) times daily before a meal. Per sliding scale: not provided   JARDIANCE 10 MG TABS tablet Take 10 mg by mouth daily.   metFORMIN (GLUCOPHAGE) 1000 MG tablet Take 1,000 mg by mouth 2 (two) times daily.   Multiple Vitamin (MULTIVITAMIN) tablet Take 1 tablet by mouth every evening.    naproxen sodium (ANAPROX) 220 MG  tablet Take 440 mg by mouth every 12 (twelve) hours as needed (pain).   omeprazole (PRILOSEC) 20 MG capsule Take 1 capsule (20 mg total) by mouth daily. (Patient taking differently: Take 20 mg by mouth 2 (two) times daily.)   oxyCODONE-acetaminophen (PERCOCET) 5-325 MG tablet Take 1 tablet by mouth every 6 (six) hours as needed.   Red Yeast Rice 600 MG CAPS Take 600 mg by mouth every evening.    spironolactone (ALDACTONE) 25 MG tablet Take 1 tablet (25 mg total) by mouth daily.   tamsulosin (FLOMAX) 0.4 MG CAPS capsule Take 1 capsule by mouth 2 (two) times daily.   valsartan-hydrochlorothiazide (DIOVAN HCT) 320-25 MG tablet Take 0.5 tablets by mouth daily.   vitamin B-12 (CYANOCOBALAMIN) 1000 MCG tablet Take 1,000 mcg by mouth every evening.      Allergies  Allergen Reactions   Latex Hives   Tramadol Rash   Codeine Other (See Comments)    constipation    Social History   Socioeconomic History   Marital status: Married    Spouse name: Not on file   Number of children: 2   Years of education: Not on file   Highest education level: Not on file  Occupational History   Occupation: Psychologist, sport and exercise (cattle)  Tobacco Use   Smoking status: Former    Packs/day: 1.00    Years: 30.00    Total pack years: 30.00    Types: Cigarettes    Quit date: 09/23/2006    Years since quitting: 15.4   Smokeless tobacco: Never  Vaping Use   Vaping Use: Never used  Substance and Sexual Activity   Alcohol use: Yes    Comment: occasionally   Drug use: No   Sexual activity: Never  Other Topics Concern   Not on file  Social History Narrative   No health insurance   Social Determinants of Health   Financial Resource Strain: Not on file  Food Insecurity: Not on file  Transportation Needs: Not on file  Physical Activity: Not on file  Stress: Not on file  Social Connections: Not on file  Intimate Partner Violence: Not on file     Review of Systems: General: negative for chills, fever, night sweats or  weight changes.  Cardiovascular: negative for chest pain, dyspnea on exertion, edema, orthopnea, palpitations, paroxysmal nocturnal dyspnea or shortness of breath Dermatological: negative for rash Respiratory: negative for cough or wheezing Urologic: negative for hematuria Abdominal: negative for nausea, vomiting, diarrhea, bright red blood per rectum, melena, or hematemesis Neurologic: negative for visual changes, syncope, or dizziness All other systems reviewed and are otherwise negative except as noted above.    Blood pressure (!) 140/60, pulse 72, height $RemoveBe'5\' 8"'[id number]$  (1.727 m), weight 209 lb 9.6 oz (95.1 kg), SpO2 92 %.  General appearance: alert and no distress Neck: no adenopathy, no carotid bruit, no JVD, supple, symmetrical, trachea midline, and thyroid not enlarged, symmetric, no tenderness/mass/nodules Lungs: clear to auscultation bilaterally Heart: regular rate and rhythm, S1, S2 normal,  no murmur, click, rub or gallop Extremities: extremities normal, atraumatic, no cyanosis or edema Pulses: 2+ and symmetric Skin: Skin color, texture, turgor normal. No rashes or lesions Neurologic: Grossly normal  EKG not performed today  ASSESSMENT AND PLAN:   Peripheral arterial disease [facility]) Mr. Gater was referred to me by Dr. Gardiner Rhyme for symptomatic PAD.  I performed peripheral angiography on him 02/06/2021 revealing a subtotally occluded right common iliac artery and right common femoral artery.  He underwent a hybrid procedure by Dr. Carlis Abbott 04/28/2021 with a stent to his right common iliac artery and right common femoral endarterectomy with profundoplasty and bovine patch angioplasty.  His claudication has essentially resolved.  He has no left lower extremity symptoms.  I am going to get aortoiliac and lower extremity arterial Doppler studies to demonstrate improvement in blood flow and will see him back on as-needed basis.     Lorretta Harp MD FACP,FACC,FAHA, [facility] 02/20/2022 2:12  PM

## 2022-03-07 ENCOUNTER — Ambulatory Visit (HOSPITAL_COMMUNITY)
Admission: RE | Admit: 2022-03-07 | Discharge: 2022-03-07 | Disposition: A | Discharge status: Home / Self Care | Payer: Medicare HMO | Source: Ambulatory Visit | Attending: Cardiovascular Disease | Admitting: Cardiovascular Disease

## 2022-03-07 ENCOUNTER — Encounter (HOSPITAL_COMMUNITY): Payer: Medicare HMO

## 2022-03-07 ENCOUNTER — Ambulatory Visit (HOSPITAL_BASED_OUTPATIENT_CLINIC_OR_DEPARTMENT_OTHER)
Admission: RE | Admit: 2022-03-07 | Discharge: 2022-03-07 | Disposition: A | Discharge status: Home / Self Care | Payer: Medicare HMO | Source: Ambulatory Visit | Attending: Cardiovascular Disease | Admitting: Cardiovascular Disease

## 2022-03-07 DIAGNOSIS — Z95820 Peripheral vascular angioplasty status with implants and grafts: Secondary | ICD-10-CM | POA: Diagnosis not present

## 2022-03-07 DIAGNOSIS — I739 Peripheral vascular disease, unspecified: Secondary | ICD-10-CM | POA: Insufficient documentation

## 2022-05-02 DIAGNOSIS — E1165 Type 2 diabetes mellitus with hyperglycemia: Secondary | ICD-10-CM | POA: Insufficient documentation

## 2022-05-02 NOTE — Telephone Encounter (Signed)
[place] is calling to state that patient is concerned about the aspirin that he takes and they would like a call back to see if that is okay to hold as well before his 01/09 procedure. Please advise.

## 2022-05-02 NOTE — Telephone Encounter (Signed)
Colletta [place] returned the call back and she has been made aware of the clearance below.  She states she has found it while we were on the phone, and thanked me for my time.

## 2022-05-02 NOTE — Telephone Encounter (Signed)
Returned the call to [place], left a message for her to call back.

## 2022-05-02 NOTE — H&P (Signed)
Date: December 19, 2021   Patient: Todd Krause  PID: [id number]  DOB: [birth year]  SEX: Male   Patient referred by DDS for extraction all remaining teeth, removal lingual tori  CC: Bad teeth.  Past Medical History:  High Blood Pressure, Diabetes, OSA, Peripheral Vascular Disease, Hepatitis, Bronchitis, CAD, Obese    Medications: Albuterol, Insulin, Amlodipine, Atorvastatin, Carvedilol, Celocoxib, Clopidogrel, Finasteride, Jardiance, Metformin, Novolin, Omeprazole, Spironolactone, Tamsulosin, Valsartan, Aspirin    Allergies:     Latex, Codeine    Surgeries:   Femoral endarterectomy, Abdominal Aortogram     Social History       Smoking:   n         Alcohol:n Drug use:n                             Exam: BMI 35.   Caries and periodontal disease remaining teeth # 6, 7, 9, 11, 21, 25, 27. Buccal exostoses maxilla and mandible. Lingual torus right mandible.  No purulence, edema, fluctuance, trismus. Oral cancer screening negative. Pharynx clear. No lymphadenopathy.  Panorex:Caries and periodontal disease remaining teeth # 6, 7, 9, 11, 21, 25, 27.   Assessment: ASA 3. Non-restorable teeth #6, 7, 9, 11, 21, 25, 27. Buccal exostoses maxilla and mandible. Lingual torus right mandible.               Plan: 1. MD Clearance  2. Extraction Teeth #  6, 7, 9, 11, 21, 25, 27. Alveoloplasty. Removal buccal exostoses maxilla and mandible. Removal lingual torus right mandible.   Hospital Day surgery.                 Rx: n               Risks and complications explained. Questions answered.   Gae Bon, DMD

## 2022-05-07 ENCOUNTER — Encounter (HOSPITAL_COMMUNITY): Payer: Self-pay | Admitting: Oral Surgery

## 2022-05-07 ENCOUNTER — Other Ambulatory Visit: Payer: Self-pay

## 2022-05-07 NOTE — Progress Notes (Addendum)
PCP - Nicholes Rough, PA Cardiologist - Dr. Quay Burow  EKG - 01/17/22 Stress Test - 03/02/21 ECHO - 12/28/21  CPAP - Mild, Does not need Cpap  Fasting Blood Sugar - 110-115 Checks Blood Sugar 3/day  Blood Thinner Instructions: Plavix last dose per Cards note 05/03/22. Actual last dose 05/03/22 Aspirin Instructions: Take DOS  ERAS Protcol - NPO  Anesthesia review: Y  Patient verbally denies any shortness of breath, fever, cough and chest pain during phone call   -------------  SDW INSTRUCTIONS given:  Your procedure is scheduled on 05/08/22.  Report to [facility] Main Entrance "A" at [place] A.M., and check in at the Admitting office.  Call this number if you have problems the morning of surgery:  [phone]   Remember:  Do not eat or drink after midnight the night before your surgery     Take these medicines the morning of surgery with A SIP OF WATER  amLODipine (NORVASC)  aspirin  carvedilol (COREG)  omeprazole (PRILOSEC)  tamsulosin (FLOMAX)  acetaminophen (TYLENOL)-if needed     OR oxyCODONE-acetaminophen (PERCOCET)-if needed albuterol (PROVENTIL) Neb-if needed  STOP empagliflozin (JARDIANCE) today  DO NOT take your metFORMIN (GLUCOPHAGE) tomorrow morning  NOVOLIN 70/30 take ONLY 18 units tonight.  .** PLEASE check your blood sugar the morning of your surgery when you wake up and every 2 hours until you get to the Short Stay unit.  If your blood sugar is less than 70 mg/dL, you will need to treat for low blood sugar: Do not take insulin. Treat a low blood sugar (less than 70 mg/dL) with  cup of clear juice (cranberry or apple), 4 glucose tablets, OR glucose gel. Recheck blood sugar in 15 minutes after treatment (to make sure it is greater than 70 mg/dL). If your blood sugar is not greater than 70 mg/dL on recheck, call [phone] for further instructions.  As of today, STOP taking any Aspirin (unless otherwise instructed by your surgeon) Aleve, Naproxen,  Ibuprofen, Motrin, Advil, Goody's, BC's, all herbal medications, fish oil, and all vitamins.                      Do not wear jewelry, make up, or nail polish            Do not wear lotions, powders, perfumes/colognes, or deodorant.            Do not shave 48 hours prior to surgery.  Men may shave face and neck.            Do not bring valuables to the hospital.            [facility] is not responsible for any belongings or valuables.  Do NOT Smoke (Tobacco/Vaping) 24 hours prior to your procedure If you use a CPAP at night, you may bring all equipment for your overnight stay.   Contacts, glasses, dentures or bridgework may not be worn into surgery.      For patients admitted to the hospital, discharge time will be determined by your treatment team.   Patients discharged the day of surgery will not be allowed to drive home, and someone needs to stay with them for 24 hours.    Special instructions:   [facility]- Preparing For Surgery  Before surgery, you can play an important role. Because skin is not sterile, your skin needs to be as free of germs as possible. You can reduce the number of germs on your skin by washing  with CHG (chlorahexidine gluconate) Soap before surgery.  CHG is an antiseptic cleaner which kills germs and bonds with the skin to continue killing germs even after washing.    Oral Hygiene is also important to reduce your risk of infection.  Remember - BRUSH YOUR TEETH THE MORNING OF SURGERY WITH YOUR REGULAR TOOTHPASTE  Please do not use if you have an allergy to CHG or antibacterial soaps. If your skin becomes reddened/irritated stop using the CHG.  Do not shave (including legs and underarms) for at least 48 hours prior to first CHG shower. It is OK to shave your face.  Please follow these instructions carefully.   Shower the NIGHT BEFORE SURGERY and the MORNING OF SURGERY with DIAL Soap.   Pat yourself dry with a CLEAN TOWEL.  Wear CLEAN PAJAMAS to bed the  night before surgery  Place CLEAN SHEETS on your bed the night of your first shower and DO NOT SLEEP WITH PETS.   Day of Surgery: Please shower morning of surgery  Wear Clean/Comfortable clothing the morning of surgery Do not apply any deodorants/lotions.   Remember to brush your teeth WITH YOUR REGULAR TOOTHPASTE.   Questions were answered. Patient verbalized understanding of instructions.

## 2022-05-07 NOTE — Anesthesia Preprocedure Evaluation (Addendum)
Anesthesia Evaluation  Patient identified by MRN, date of birth, ID band Patient awake    Reviewed: Allergy & Precautions, NPO status , Patient's Chart, lab work & pertinent test results  History of Anesthesia Complications Negative for: history of anesthetic complications  Airway Mallampati: III  TM Distance: >3 FB Neck ROM: Full    Dental  (+) Poor Dentition, Dental Advisory Given   Pulmonary sleep apnea , former smoker   Pulmonary exam normal        Cardiovascular hypertension, Pt. on medications and Pt. on home beta blockers + CAD and + Peripheral Vascular Disease  Normal cardiovascular exam  11/22 Myo-perfusion scan 1. Overall, suspect this is likely a normal study without evidence of ischemia or infarction. There are significant artifacts described below.  2. There are reduced counts in the inferior segments consistent with diaphragm attenuation as the wall motion is normal in this region.  3. There are reduced counts in the anterior and septum on rest imaging that improve on stress imaging. Suspect this is related to significant reduced counts in the myocardium due to gut uptake.  4. Normal LVEF for this modality, 48%.  5. This is a low-risk study.     Neuro/Psych negative neurological ROS     GI/Hepatic Neg liver ROS, hiatal hernia,GERD  ,,  Endo/Other  diabetes, Type 2, Insulin Dependent    Renal/GU negative Renal ROS  negative genitourinary   Musculoskeletal  (+) Arthritis ,    Abdominal   Peds  Hematology negative hematology ROS (+)   Anesthesia Other Findings   Reproductive/Obstetrics                             Anesthesia Physical Anesthesia Plan  ASA: 3  Anesthesia Plan: General   Post-op Pain Management: Tylenol PO (pre-op)   Induction: Intravenous  PONV Risk Score and Plan: 2 and Ondansetron, Dexamethasone, Treatment may vary due to age or medical condition and  Diphenhydramine  Airway Management Planned: Nasal ETT  Additional Equipment:   Intra-op Plan:   Post-operative Plan: Extubation in OR  Informed Consent: I have reviewed the patients History and Physical, chart, labs and discussed the procedure including the risks, benefits and alternatives for the proposed anesthesia with the patient or authorized representative who has indicated his/her understanding and acceptance.     Dental advisory given  Plan Discussed with: CRNA, Anesthesiologist and Surgeon  Anesthesia Plan Comments: (PAT note written 05/07/2022 by Myra Gianotti, PA-C.  )        Anesthesia Quick Evaluation

## 2022-05-07 NOTE — Progress Notes (Signed)
Anesthesia Chart Review:  Case: [id number] Date/Time: 05/08/22 1010   Procedure: DENTAL RESTORATION/EXTRACTIONS   Anesthesia type: General   Pre-op diagnosis: NON RESTORABLE   [facility]: [facility] ROOM 07 / [facility]   Surgeons: Ocie Doyne, DMD       DISCUSSION: Todd Krause is a 72 year old male scheduled for the above procedure.   History includes former smoker (quit 09/23/06), chronic bronchitis, HTN, HLD, DM2, CAD, left BBB, PAD (right CIA stent, right CFA endarterectomy with profundoplasty 04/28/21), BPH (s/p TURP 05/09/15), OSA (mild-moderate OSA 01/28/04; does not use CPAP), hiatal hernia, melanoma (resection > 15 years ago, back), hepatitis (during high school).   Last cardiology visit with Dr. Allyson Sabal was on 02/20/22 with as needed [facility] cardiology follow-up recommended. He had preoperative evaluation with Tereso Newcomer, PA-C on 12/27/21. He wrote, " Preoperative Cardiovascular Risk Assessment:   Todd Krause's perioperative risk of a major cardiac event is 0.9% according to the Revised Cardiac Risk Index (RCRI).  Therefore, he is at low risk for perioperative complications.   His functional capacity is good at 4.31 METs according to the Duke Activity Status Index (DASI). Recommendations: According to ACC/AHA guidelines, no further cardiovascular testing needed.  The Todd Krause may proceed to surgery at acceptable risk.   Antiplatelet and/or Anticoagulation Recommendations: The Todd Krause should remain on Aspirin without interruption.   Clopidogrel (Plavix) can be held for 5 days prior to his surgery and resumed as soon as possible post op. The Todd Krause does not require antibiotic prophylaxis." He reported last Plavix as 05/03/22.  Anesthesia team to evaluate on the day of surgery. Labs from 05/02/22 [facility], see Care Everywhere) appear acceptable for OR. He will need a CBG on arrival. He had not been instructed to hold Jardiance until PAT RN staff spoke with him on 05/07/22. He is also on metformin and Novolin  70/30 for DM-last A1c 7.7%.       VS: Ht 5\' 8"  (1.727 m)   Wt 96.2 kg   BMI 32.23 kg/m  BP Readings from Last 3 Encounters:  02/20/22 (!) 140/60  01/17/22 (!) 140/80  06/21/21 122/60   Pulse Readings from Last 3 Encounters:  02/20/22 72  01/17/22 60  06/21/21 71     PROVIDERS: 06/23/21, PA-C is PCP  Ladora Daniel, MD is primary cardiologist Epifanio Lesches, MD is [facility] cardiologist. As needed follow-up per 02/20/22 visit.  02/22/22, MD is vascular surgeon   LABS: For day of surgery as indicated. He had a CBC, CMP, Lipid Panel, PSA, A1c on 05/02/22 through [facility] (see Care Everywhere). A1c 7.7%, CBC and CMP normal except CO2 low at 18.   EKG: 01/17/22: SR with fusion complexes. Left BBB (old).    CV: Nuclear stress 03/02/2021: Overall, suspect this is likely a normal study without evidence of ischemia or infarction. There are significant artifacts described below.  There are reduced counts in the inferior segments consistent with diaphragm attenuation as the wall motion is normal in this region.  There are reduced counts in the anterior and septum on rest imaging that improve on stress imaging. Suspect this is related to significant reduced counts in the myocardium due to gut uptake.  Normal LVEF for this modality, 48%.  This is a low-risk study.    TTE 12/28/2020:  1. Septal dyskinesis consistent with bundle branch block. Left  ventricular ejection fraction, by estimation, is 50 to 55%. The left  ventricle has low normal function. The left ventricle demonstrates  regional wall motion  abnormalities (see scoring  diagram/findings for description). Left ventricular diastolic parameters  are consistent with Grade I diastolic dysfunction (impaired relaxation).   2. Right ventricular systolic function is normal. The right ventricular  size is normal. There is normal pulmonary artery systolic pressure.   3. The mitral valve is normal in structure. No  evidence of mitral valve  regurgitation. No evidence of mitral stenosis.   4. The aortic valve is tricuspid. Aortic valve regurgitation is not  visualized. No aortic stenosis is present.   Cardiac cath 11/21/2009: - LM: Normal. - LAD: 40-50% mid-distal stenosis after DIAG2 branch. - CX: Non-dominant with 50% stenosis in the mid AV groove CX before last 2 OM branches. - RAMUS: Small and free of significant disease. - RCA: Dominant and free of significant disease.  - LVEF > 60%.   Past Medical History:  Diagnosis Date   Arthritis    hands, knees   BPH (benign prostatic hypertrophy)    Chronic bronchitis (HCC)    Coronary artery disease    ED (erectile dysfunction)    Elevated PSA    GERD (gastroesophageal reflux disease)    Hepatitis    when in High School in the 60's. Does not know which one it was. No current issues   History of colon polyps    History of hiatal hernia    Hyperlipidemia    Hypertension    Lower urinary tract symptoms (LUTS)    OSA (obstructive sleep apnea)    mild to moderate osa per study 02-18-2004--  no cpap   Peripheral vascular disease (HCC)    Prostate nodule    Type 2 diabetes mellitus (HCC)     Past Surgical History:  Procedure Laterality Date   ABDOMINAL AORTOGRAM W/LOWER EXTREMITY N/A 02/06/2021   Procedure: ABDOMINAL AORTOGRAM W/LOWER EXTREMITY;  Surgeon: [name], MD;  [facility];  Service: Cardiovascular;  Laterality: N/A;   AORTOGRAM N/A 04/28/2021   Procedure: AORTOGRAM WITH ILIAC ARTERIOGRAM;  Surgeon: [name], MD;  Location: [facility];  Service: Vascular;  Laterality: N/A;   CARDIAC CATHETERIZATION  11-21-2009  dr Christiane Ha berry   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,  50% mCFX,  mild pulmonary HTN/  preserved LV , ef >60%   COLONOSCOPY  last one -- april 2016   ENDARTERECTOMY FEMORAL Right 04/28/2021   Procedure: RIGHT COMMON FEMORAL ARTERY ENDARTERECTOMY;  Surgeon: [name], MD;   Location: [facility];  Service: Vascular;  Laterality: Right;   EYE SURGERY Bilateral    iol lens for cataracts   INSERTION OF ILIAC STENT Right 04/28/2021   Procedure: RIGHT COMMON ILIAC ARTERY ANGIOPLASTY WITH INSERTION OF RIGHT ILIAC STENT;  Surgeon: [name], MD;  Location[facility];  Service: Vascular;  Laterality: Right;   melanoma removed from back  15 yrs ago   PATCH ANGIOPLASTY Right 04/28/2021   Procedure: RIGHT COMMON FEMORAL PATCH ANGIOPLASTY;  Surgeon: [name], MD;  Location: [facility];  Service: Vascular;  Laterality: Right;   PROSTATE BIOPSY N/A 10/01/2014   Procedure: BIOPSY TRANSRECTAL ULTRASONIC PROSTATE (TUBP);  Surgeon: Jethro Bolus, MD;  Location: [facility];  Service: Urology;  Laterality: N/A;   TRANSTHORACIC ECHOCARDIOGRAM  11-20-2009   grade I diastolic dysfunction/  ef 60-65%/  mild AR, MR, and TR/  trivial PR   TRANSURETHRAL RESECTION OF PROSTATE N/A 05/09/2015   Procedure: CYSTOSCOPY TRANSURETHRAL RESECTION OF THE PROSTATE (TURP);  Surgeon: Jethro Bolus, MD;  Location:  [facility];  Service: Urology;  Laterality: N/A;    MEDICATIONS: No current facility-administered medications for this encounter.    acetaminophen (TYLENOL) 500 MG tablet   albuterol (PROVENTIL) (2.5 MG/3ML) 0.083% nebulizer solution   amLODipine (NORVASC) 10 MG tablet   aspirin EC 81 MG EC tablet   atorvastatin (LIPITOR) 20 MG tablet   carvedilol (COREG) 12.5 MG tablet   celecoxib (CELEBREX) 200 MG capsule   Cholecalciferol (VITAMIN D3) 2000 UNITS TABS   clopidogrel (PLAVIX) 75 MG tablet   empagliflozin (JARDIANCE) 25 MG TABS tablet   finasteride (PROSCAR) 5 MG tablet   insulin isophane & regular human KwikPen (NOVOLIN 70/30 KWIKPEN) (70-30) 100 UNIT/ML KwikPen   metFORMIN (GLUCOPHAGE) 1000 MG tablet   Multiple Vitamin (MULTIVITAMIN) tablet   naproxen sodium (ANAPROX) 220 MG tablet   omeprazole (PRILOSEC) 20 MG capsule   oxyCODONE-acetaminophen (PERCOCET)  5-325 MG tablet   Red Yeast Rice 600 MG CAPS   spironolactone (ALDACTONE) 25 MG tablet   tamsulosin (FLOMAX) 0.4 MG CAPS capsule   valsartan-hydrochlorothiazide (DIOVAN HCT) 320-25 MG tablet   vitamin B-12 (CYANOCOBALAMIN) 1000 MCG tablet   Alcohol Swabs (ALCOHOL WIPES) 70 % PADS   Blood Glucose Monitoring Suppl (ACCU-CHEK GUIDE) w/Device KIT    Myra Gianotti, PA-C Surgical Short Stay/Anesthesiology [facility] Phone [phone] [facility] Phone [phone] 05/07/2022 2:53 PM

## 2022-05-08 ENCOUNTER — Encounter (HOSPITAL_COMMUNITY)
Admission: RE | Disposition: A | Discharge status: Home / Self Care | Payer: Self-pay | Source: Ambulatory Visit | Attending: Oral Surgery

## 2022-05-08 ENCOUNTER — Other Ambulatory Visit: Payer: Self-pay

## 2022-05-08 ENCOUNTER — Ambulatory Visit (HOSPITAL_COMMUNITY)
Admission: RE | Admit: 2022-05-08 | Discharge: 2022-05-08 | Disposition: A | Discharge status: Home / Self Care | Payer: Medicare HMO | Source: Ambulatory Visit | Attending: Oral Surgery | Admitting: Oral Surgery

## 2022-05-08 ENCOUNTER — Ambulatory Visit (HOSPITAL_BASED_OUTPATIENT_CLINIC_OR_DEPARTMENT_OTHER): Payer: Medicare HMO | Admitting: Vascular Surgery

## 2022-05-08 ENCOUNTER — Encounter (HOSPITAL_COMMUNITY): Payer: Self-pay | Admitting: Oral Surgery

## 2022-05-08 ENCOUNTER — Ambulatory Visit (HOSPITAL_COMMUNITY): Payer: Medicare HMO | Admitting: Vascular Surgery

## 2022-05-08 DIAGNOSIS — E1151 Type 2 diabetes mellitus with diabetic peripheral angiopathy without gangrene: Secondary | ICD-10-CM | POA: Diagnosis not present

## 2022-05-08 DIAGNOSIS — Z7984 Long term (current) use of oral hypoglycemic drugs: Secondary | ICD-10-CM | POA: Diagnosis not present

## 2022-05-08 DIAGNOSIS — K219 Gastro-esophageal reflux disease without esophagitis: Secondary | ICD-10-CM | POA: Insufficient documentation

## 2022-05-08 DIAGNOSIS — Z79899 Other long term (current) drug therapy: Secondary | ICD-10-CM | POA: Diagnosis not present

## 2022-05-08 DIAGNOSIS — K056 Periodontal disease, unspecified: Secondary | ICD-10-CM

## 2022-05-08 DIAGNOSIS — Z87891 Personal history of nicotine dependence: Secondary | ICD-10-CM

## 2022-05-08 DIAGNOSIS — I2510 Atherosclerotic heart disease of native coronary artery without angina pectoris: Secondary | ICD-10-CM | POA: Diagnosis not present

## 2022-05-08 DIAGNOSIS — M278 Other specified diseases of jaws: Secondary | ICD-10-CM | POA: Diagnosis not present

## 2022-05-08 DIAGNOSIS — G4733 Obstructive sleep apnea (adult) (pediatric): Secondary | ICD-10-CM | POA: Insufficient documentation

## 2022-05-08 DIAGNOSIS — K0889 Other specified disorders of teeth and supporting structures: Secondary | ICD-10-CM | POA: Diagnosis not present

## 2022-05-08 DIAGNOSIS — K449 Diaphragmatic hernia without obstruction or gangrene: Secondary | ICD-10-CM | POA: Diagnosis not present

## 2022-05-08 DIAGNOSIS — M1990 Unspecified osteoarthritis, unspecified site: Secondary | ICD-10-CM | POA: Diagnosis not present

## 2022-05-08 DIAGNOSIS — K029 Dental caries, unspecified: Secondary | ICD-10-CM

## 2022-05-08 DIAGNOSIS — I447 Left bundle-branch block, unspecified: Secondary | ICD-10-CM

## 2022-05-08 DIAGNOSIS — I739 Peripheral vascular disease, unspecified: Secondary | ICD-10-CM

## 2022-05-08 DIAGNOSIS — I10 Essential (primary) hypertension: Secondary | ICD-10-CM | POA: Diagnosis not present

## 2022-05-08 DIAGNOSIS — N179 Acute kidney failure, unspecified: Secondary | ICD-10-CM

## 2022-05-08 DIAGNOSIS — Z794 Long term (current) use of insulin: Secondary | ICD-10-CM | POA: Diagnosis not present

## 2022-05-08 DIAGNOSIS — R7989 Other specified abnormal findings of blood chemistry: Secondary | ICD-10-CM

## 2022-05-08 DIAGNOSIS — R972 Elevated prostate specific antigen [PSA]: Secondary | ICD-10-CM

## 2022-05-08 HISTORY — PX: TOOTH EXTRACTION: SHX859

## 2022-05-08 HISTORY — DX: Left bundle-branch block, unspecified: I44.7

## 2022-05-08 LAB — GLUCOSE, CAPILLARY
Glucose-Capillary: 211 mg/dL — ABNORMAL HIGH (ref 70–99)
Glucose-Capillary: 163 mg/dL — ABNORMAL HIGH (ref 70–99)

## 2022-05-08 SURGERY — DENTAL RESTORATION/EXTRACTIONS
Anesthesia: General

## 2022-05-08 MED ORDER — ORAL CARE MOUTH RINSE: 15.0000 mL | Freq: Once | OROMUCOSAL | Status: AC

## 2022-05-08 MED ORDER — CEFAZOLIN SODIUM-DEXTROSE 2-4 GM/100ML-% IV SOLN
2.0000 g | INTRAVENOUS | Status: AC
  Administered 2022-05-08: 2 g via INTRAVENOUS
  Filled 2022-05-08: qty 100

## 2022-05-08 MED ORDER — INSULIN ASPART 100 UNIT/ML IJ SOLN
INTRAMUSCULAR | Status: AC
  Filled 2022-05-08: qty 1

## 2022-05-08 MED ORDER — PROMETHAZINE HCL 25 MG/ML IJ SOLN: 6.2500 mg | INTRAMUSCULAR | Status: DC | PRN

## 2022-05-08 MED ORDER — DEXAMETHASONE SODIUM PHOSPHATE 10 MG/ML IJ SOLN
INTRAMUSCULAR | Status: DC | PRN
  Administered 2022-05-08: 5 mg via INTRAVENOUS

## 2022-05-08 MED ORDER — FENTANYL CITRATE (PF) 250 MCG/5ML IJ SOLN
INTRAMUSCULAR | Status: DC | PRN
  Administered 2022-05-08 (×2): 50 ug via INTRAVENOUS

## 2022-05-08 MED ORDER — LIDOCAINE 2% (20 MG/ML) 5 ML SYRINGE
INTRAMUSCULAR | Status: DC | PRN
  Administered 2022-05-08: 100 mg via INTRAVENOUS

## 2022-05-08 MED ORDER — MIDAZOLAM HCL 2 MG/2ML IJ SOLN
INTRAMUSCULAR | Status: DC | PRN
  Administered 2022-05-08: 2 mg via INTRAVENOUS

## 2022-05-08 MED ORDER — LIDOCAINE-EPINEPHRINE 1 %-1:100000 IJ SOLN
INTRAMUSCULAR | Status: AC
  Filled 2022-05-08: qty 1

## 2022-05-08 MED ORDER — LIDOCAINE-EPINEPHRINE 2 %-1:100000 IJ SOLN
INTRAMUSCULAR | Status: DC | PRN
  Administered 2022-05-08: 20 mL via INTRADERMAL

## 2022-05-08 MED ORDER — LIDOCAINE 2% (20 MG/ML) 5 ML SYRINGE
INTRAMUSCULAR | Status: AC
  Filled 2022-05-08: qty 5

## 2022-05-08 MED ORDER — ACETAMINOPHEN 500 MG PO TABS
1000.0000 mg | ORAL_TABLET | Freq: Once | ORAL | Status: DC
  Filled 2022-05-08: qty 2

## 2022-05-08 MED ORDER — ONDANSETRON HCL 4 MG/2ML IJ SOLN
INTRAMUSCULAR | Status: DC | PRN
  Administered 2022-05-08: 4 mg via INTRAVENOUS

## 2022-05-08 MED ORDER — PHENYLEPHRINE 80 MCG/ML (10ML) SYRINGE FOR IV PUSH (FOR BLOOD PRESSURE SUPPORT)
PREFILLED_SYRINGE | INTRAVENOUS | Status: DC | PRN
  Administered 2022-05-08 (×2): 160 ug via INTRAVENOUS
  Administered 2022-05-08: 80 ug via INTRAVENOUS

## 2022-05-08 MED ORDER — OXYMETAZOLINE HCL 0.05 % NA SOLN
1.0000 | Freq: Two times a day (BID) | NASAL | Status: DC
  Filled 2022-05-08: qty 30

## 2022-05-08 MED ORDER — PROPOFOL 10 MG/ML IV BOLUS
INTRAVENOUS | Status: AC
  Filled 2022-05-08: qty 20

## 2022-05-08 MED ORDER — ONDANSETRON HCL 4 MG/2ML IJ SOLN
INTRAMUSCULAR | Status: AC
  Filled 2022-05-08: qty 2

## 2022-05-08 MED ORDER — 0.9 % SODIUM CHLORIDE (POUR BTL) OPTIME
TOPICAL | Status: DC | PRN
  Administered 2022-05-08: 1000 mL

## 2022-05-08 MED ORDER — FENTANYL CITRATE (PF) 250 MCG/5ML IJ SOLN
INTRAMUSCULAR | Status: AC
  Filled 2022-05-08: qty 5

## 2022-05-08 MED ORDER — INSULIN ASPART 100 UNIT/ML IJ SOLN
0.0000 [IU] | INTRAMUSCULAR | Status: DC | PRN
  Administered 2022-05-08: 2 [IU] via SUBCUTANEOUS

## 2022-05-08 MED ORDER — LACTATED RINGERS IV SOLN: INTRAVENOUS | Status: DC

## 2022-05-08 MED ORDER — AMISULPRIDE (ANTIEMETIC) 5 MG/2ML IV SOLN: 10.0000 mg | Freq: Once | INTRAVENOUS | Status: DC | PRN

## 2022-05-08 MED ORDER — FENTANYL CITRATE (PF) 100 MCG/2ML IJ SOLN: 25.0000 ug | INTRAMUSCULAR | Status: DC | PRN

## 2022-05-08 MED ORDER — PROPOFOL 10 MG/ML IV BOLUS
INTRAVENOUS | Status: DC | PRN
  Administered 2022-05-08: 160 mg via INTRAVENOUS

## 2022-05-08 MED ORDER — OXYCODONE-ACETAMINOPHEN 5-325 MG PO TABS: 1.0000 | ORAL_TABLET | ORAL | 0 refills | Status: DC | PRN

## 2022-05-08 MED ORDER — CHLORHEXIDINE GLUCONATE 0.12 % MT SOLN
15.0000 mL | Freq: Once | OROMUCOSAL | Status: AC
  Administered 2022-05-08: 15 mL via OROMUCOSAL
  Filled 2022-05-08: qty 15

## 2022-05-08 MED ORDER — MIDAZOLAM HCL 2 MG/2ML IJ SOLN
INTRAMUSCULAR | Status: AC
  Filled 2022-05-08: qty 2

## 2022-05-08 MED ORDER — AMOXICILLIN 500 MG PO CAPS: 500.0000 mg | ORAL_CAPSULE | Freq: Three times a day (TID) | ORAL | 0 refills | Status: DC

## 2022-05-08 MED ORDER — ROCURONIUM BROMIDE 10 MG/ML (PF) SYRINGE
PREFILLED_SYRINGE | INTRAVENOUS | Status: AC
  Filled 2022-05-08: qty 10

## 2022-05-08 MED ORDER — SUGAMMADEX SODIUM 200 MG/2ML IV SOLN
INTRAVENOUS | Status: DC | PRN
  Administered 2022-05-08: 200 mg via INTRAVENOUS

## 2022-05-08 MED ORDER — ROCURONIUM BROMIDE 10 MG/ML (PF) SYRINGE
PREFILLED_SYRINGE | INTRAVENOUS | Status: DC | PRN
  Administered 2022-05-08: 70 mg via INTRAVENOUS

## 2022-05-08 MED ORDER — SODIUM CHLORIDE 0.9 % IR SOLN
Status: DC | PRN
  Administered 2022-05-08: 1000 mL

## 2022-05-08 MED ORDER — DEXAMETHASONE SODIUM PHOSPHATE 10 MG/ML IJ SOLN
INTRAMUSCULAR | Status: AC
  Filled 2022-05-08: qty 1

## 2022-05-08 SURGICAL SUPPLY — 32 items
BAG COUNTER SPONGE SURGICOUNT (BAG) IMPLANT
BLADE SURG 15 STRL LF DISP TIS (BLADE) ×1 IMPLANT
BLADE SURG 15 STRL SS (BLADE) ×1
BUR CROSS CUT FISSURE 1.6 (BURR) ×1 IMPLANT
BUR EGG ELITE 4.0 (BURR) ×1 IMPLANT
CANISTER SUCT 3000ML PPV (MISCELLANEOUS) ×1 IMPLANT
COVER SURGICAL LIGHT HANDLE (MISCELLANEOUS) ×1 IMPLANT
GAUZE PACKING FOLDED 2  STR (GAUZE/BANDAGES/DRESSINGS) ×1
GAUZE PACKING FOLDED 2 STR (GAUZE/BANDAGES/DRESSINGS) ×1 IMPLANT
GLOVE SURG POLY ORTHO LF SZ8 (GLOVE) IMPLANT
GLOVE SURG SS PI 8.0 STRL IVOR (GLOVE) IMPLANT
GOWN STRL REUS W/ TWL LRG LVL3 (GOWN DISPOSABLE) ×1 IMPLANT
GOWN STRL REUS W/ TWL XL LVL3 (GOWN DISPOSABLE) ×1 IMPLANT
GOWN STRL REUS W/TWL LRG LVL3 (GOWN DISPOSABLE) ×1
GOWN STRL REUS W/TWL XL LVL3 (GOWN DISPOSABLE) ×1
IV NS 1000ML (IV SOLUTION) ×1
IV NS 1000ML BAXH (IV SOLUTION) ×1 IMPLANT
KIT BASIN OR (CUSTOM PROCEDURE TRAY) ×1 IMPLANT
KIT TURNOVER KIT B (KITS) ×1 IMPLANT
NDL HYPO 25GX1X1/2 BEV (NEEDLE) ×2 IMPLANT
NEEDLE HYPO 25GX1X1/2 BEV (NEEDLE) ×2 IMPLANT
NS IRRIG 1000ML POUR BTL (IV SOLUTION) ×1 IMPLANT
PAD ARMBOARD 7.5X6 YLW CONV (MISCELLANEOUS) ×1 IMPLANT
SLEEVE IRRIGATION ELITE 7 (MISCELLANEOUS) ×1 IMPLANT
SPIKE FLUID TRANSFER (MISCELLANEOUS) ×1 IMPLANT
SPONGE SURGIFOAM ABS GEL 12-7 (HEMOSTASIS) IMPLANT
SUT CHROMIC 3 0 PS 2 (SUTURE) ×1 IMPLANT
SYR BULB IRRIG 60ML STRL (SYRINGE) ×1 IMPLANT
SYR CONTROL 10ML LL (SYRINGE) ×1 IMPLANT
TRAY ENT MC OR (CUSTOM PROCEDURE TRAY) ×1 IMPLANT
TUBING IRRIGATION (MISCELLANEOUS) ×1 IMPLANT
YANKAUER SUCT BULB TIP NO VENT (SUCTIONS) ×1 IMPLANT

## 2022-05-08 NOTE — Transfer of Care (Signed)
Immediate Anesthesia Transfer of Care Note  Patient: Todd Krause  Procedure(s) Performed: DENTAL RESTORATION/EXTRACTIONS  Patient Location: PACU  Anesthesia Type:General  Level of Consciousness: drowsy and patient cooperative  Airway & Oxygen Therapy: Patient Spontanous Breathing and Patient connected to nasal cannula oxygen  Post-op Assessment: Report given to RN and Post -op Vital signs reviewed and stable  Post vital signs: stable  Last Vitals:  Vitals Value Taken Time  BP    Temp    Pulse 66 05/08/22 1129  Resp 11 05/08/22 1129  SpO2 95 % 05/08/22 1129  Vitals shown include unvalidated device data.  Last Pain:  Vitals:   05/08/22 0819  TempSrc:   PainSc: 0-No pain      Patients Stated Pain Goal: 3 (56/38/75 6433)  Complications: No notable events documented.

## 2022-05-08 NOTE — H&P (Signed)
H&P documentation  -History and Physical Reviewed  -Patient has been re-examined  -No change in the plan of care  Todd Krause  

## 2022-05-08 NOTE — Op Note (Signed)
NAME: Todd Krause, Todd Krause MEDICAL RECORD NO: [id number] ACCOUNT NO: [id number] DATE OF BIRTH: 11-Dec-1950 FAC[facility] LOCATION: [facility] PHYSICIAN: Georgia Lopes, DDS  Operative Report   DATE OF PROCEDURE: 05/08/2022  PREOPERATIVE DIAGNOSES:  Nonrestorable teeth secondary to dental caries and periodontal disease #6, 7, 9, 11, 21, 27; right mandibular lingual torus; maxillary right and left buccal exostoses; mandibular right and left buccal exostoses.  POSTOPERATIVE DIAGNOSES:  Nonrestorable teeth secondary to dental caries and periodontal disease #6, 7, 9, 11, 21, 27; right mandibular lingual torus; maxillary right and left buccal exostoses; mandibular right and left buccal exostoses.  PROCEDURE:  Extraction teeth #6, 7, 9, 11, 21 27; alveoloplasty right and left maxilla and mandible; removal right mandibular lingual torus; removal maxillary and mandibular bilateral buccal exostoses.  SURGEON:  Georgia Lopes, DDS  ANESTHESIA:  General, nasal intubation, Dr. Krista Blue attending.  DESCRIPTION OF PROCEDURE:  The patient was taken to the operating room and placed on the table in supine position.  General anesthesia was administered and nasal endotracheal tube was placed and secured.  The eyes were protected.  The patient was draped  for surgery.  Timeout was performed.  The posterior pharynx was suctioned and throat pack was placed.  2% lidocaine 1:100,000 epinephrine was infiltrated in an inferior alveolar block on the right and left sides in the anterior mandibular infiltration  and maxillary right and left buccal and palatal infiltration around the teeth to be removed.  A bite block was placed on the right side of the mouth.  A sweetheart retractor was used to retract the tongue.  A #15 blade used to make an incision at the  edentulous area of the first molar of the left mandible, carried forward to tooth #21 and then carried forward along the alveolar edentulous crest to tooth #27.   The periosteum was reflected.  Tooth #21 and 27 were easily removed with dental forceps.   The periosteum was firmly adherent to the underlying bone, but was reflected.  There was much granulation tissue in the sockets owing to severe periodontal disease.  This tissue was removed with dental curettes and rongeur.  The exostosis was reduced  using the Stryker handpiece and followed by the bone file.  Then, alveoloplasty was performed using the egg bur and the Stryker handpiece and the bone file.  Then, the left mandible was irrigated and closed with 3-0 chromic.  Then, attention was turned  to the left maxilla, 15 blade was used to make an incision beginning at the proximal most part of the maxillary alveolus on the left side carried forward to tooth #11 and then reflected on the alveolar crest to tooth #9, 7 and 6.  The periosteum was  reflected.  The teeth were easily removed with universal upper forceps. The sockets were debrided using the curette and rongeurs.  Then, the exostosis at the tuberosity was removed using the egg bur followed by the bone file, then the anterior tuberosity  by the bone file. Then the anterior tuberosity in the area of the canine region was removed using the egg bur followed by the bone file.  Alveoloplasty was performed to smooth the contour where the periodontal defects were and then the left maxilla was  closed with 3-0 chromic.  Then, the bite block was repositioned to the other side of the mouth. The right mandible was operated, a 15 blade was used to make an incision in the area of tooth #13 in the edentulous space and was  carried forward to tooth #27  area.  The tissue was trimmed to remove the granulation tissue, the rongeur and curette were used for this.  Once the tissue was reflected both buccally and lingually, alveoplasty was performed and the lingual torus was removed using the egg bur  followed by the bone file.  The right mandible was then closed with 3-0 chromic.   Then, the right maxilla with the 15 blade was used to make an incision at the palatal tuberosity region in the edentulous area and carried forward to tooth #6 area.  The  periosteum was reflected.  The exostosis was identified and reduced with the Stryker handpiece and fissure bur under irrigation and alveoplasty was performed to smooth the bone in the area of the right maxilla using the egg bur and the bone file.  The  area was irrigated and closed with 3-0 chromic.  The oral cavity was then inspected and found to have good contour, hemostasis and closure.  The throat pack was removed.  The patient was left under care of anesthesia for extubation and transported to  recovery room with plans for discharge home through day surgery.  ESTIMATED BLOOD LOSS:  Minimal.  COMPLICATIONS:  None.  SPECIMENS:  None.  COUNTS:  Correct.   NIK D: 05/08/2022 11:22:49 am T: 05/08/2022 11:49:00 am  JOB: [id number]/ [id number]

## 2022-05-08 NOTE — Op Note (Signed)
05/08/2022  11:16 AM  PATIENT:  Loura Back  72 y.o. male  PRE-OPERATIVE DIAGNOSIS:  NON RESTORABLE TEETH # 6, 7, 9, 11, 21, 27, RIGHT MANDIBULAR LINGUAL TORUS, MAXILLARY RIGHT AND LEFT BUCCAL EXOSTOSES, MANDIBULAR RIGHT AND LEFT BUCCAL EXOSTOSES  POST-OPERATIVE DIAGNOSIS:  SAME  PROCEDURE:  Procedure(s):EXTRACTIONS  TEETH # 6, 7, 9, 11, 21, 27, ALVEOLOPLASTY RIGHT AND LEFT MAXILLA  AND MANDIBLE, REMOVAL RIGHT MANDIBULAR LINGUAL TORUS, REMOVAL MAXILLARY RIGHT AND LEFT BUCCAL EXOSTOSES, REMOVAL MANDIBULAR RIGHT AND LEFT BUCCAL EXOSTOSES  SURGEON:  Surgeon(s): Diona Browner, DMD  ANESTHESIA:   local and general  EBL:  minimal  DRAINS: none   SPECIMEN:  No Specimen  COUNTS:  YES  PLAN OF CARE: Discharge to home after PACU  PATIENT DISPOSITION:  PACU - hemodynamically stable.   PROCEDURE DETAILS: Dictation #[id number]  Gae Bon, DMD 05/08/2022 11:16 AM

## 2022-05-08 NOTE — Anesthesia Procedure Notes (Signed)
Procedure Name: Intubation Date/Time: 05/08/2022 10:13 AM  Performed by: Mosetta Pigeon, CRNAPre-anesthesia Checklist: Patient identified, Emergency Drugs available, Suction available and Patient being monitored Patient Re-evaluated:Patient Re-evaluated prior to induction Oxygen Delivery Method: Circle system utilized Preoxygenation: Pre-oxygenation with 100% oxygen Induction Type: IV induction Ventilation: Mask ventilation without difficulty Laryngoscope Size: Mac and 4 Grade View: Grade II Nasal Tubes: Nasal prep performed, Nasal Rae and Magill forceps - small, utilized Tube size: 7.0 mm Number of attempts: 1 Placement Confirmation: ETT inserted through vocal cords under direct vision, positive ETCO2 and breath sounds checked- equal and bilateral Secured at: 28 cm Tube secured with: Tape Dental Injury: Teeth and Oropharynx as per pre-operative assessment

## 2022-05-08 NOTE — Anesthesia Postprocedure Evaluation (Signed)
Anesthesia Post Note  Patient: Todd Krause  Procedure(s) Performed: DENTAL RESTORATION/EXTRACTIONS     Patient location during evaluation: PACU Anesthesia Type: General Level of consciousness: sedated Pain management: pain level controlled Vital Signs Assessment: post-procedure vital signs reviewed and stable Respiratory status: spontaneous breathing and respiratory function stable Cardiovascular status: stable Postop Assessment: no apparent nausea or vomiting Anesthetic complications: no   No notable events documented.  Last Vitals:  Vitals:   05/08/22 1145 05/08/22 1200  BP: (!) 162/65 (!) 169/71  Pulse: 63 64  Resp: 14 12  Temp:  36.6 C  SpO2: 98% 93%    Last Pain:  Vitals:   05/08/22 0819  TempSrc:   PainSc: 0-No pain                 Sima Lindenberger DANIEL

## 2022-05-09 ENCOUNTER — Encounter (HOSPITAL_COMMUNITY): Payer: Self-pay | Admitting: Oral Surgery

## 2022-08-14 ENCOUNTER — Other Ambulatory Visit: Payer: Self-pay | Admitting: *Deleted

## 2022-08-14 MED ORDER — SPIRONOLACTONE 25 MG PO TABS: 25.0000 mg | ORAL_TABLET | Freq: Every day | ORAL | 0 refills | Status: DC

## 2022-09-08 IMAGING — CT CT ANGIO AOBIFEM WO/W CM
1 of 12 series · 3 of 16 positions shown, 4 images · IV contrast (APPLIED)
Comparison: Chest XR, 11/19/2009.

CLINICAL DATA: Peripheral arterial disease.  Claudication.

EXAM:
CT ANGIOGRAPHY OF ABDOMINAL AORTA WITH ILIOFEMORAL RUNOFF
TECHNIQUE: Multidetector CT imaging of the abdomen, pelvis and lower
extremities was performed using the standard protocol during bolus
administration of intravenous contrast. Multiplanar CT image
reconstructions and MIPs were obtained to evaluate the vascular
anatomy.
CONTRAST:  125mL OMNIPAQUE IOHEXOL 350 MG/ML SOLN

[Series 8: lower arterial thins · axial · arterial · 0.90mm/px · z∈[+302,+872]mm · 3 of 1758 slices shown, 4 images]
[im 440/1758  soft-tissue]
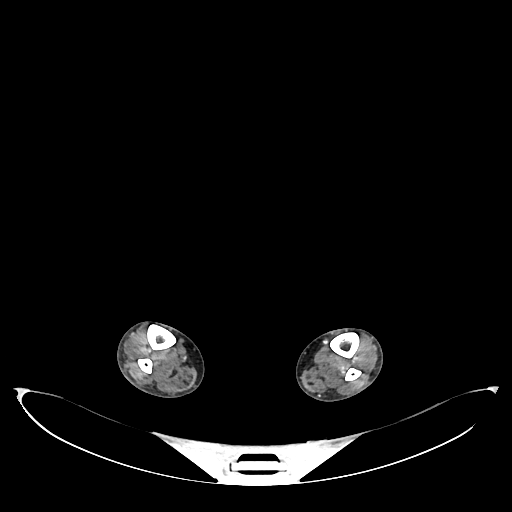
[im 440/1758  bone]
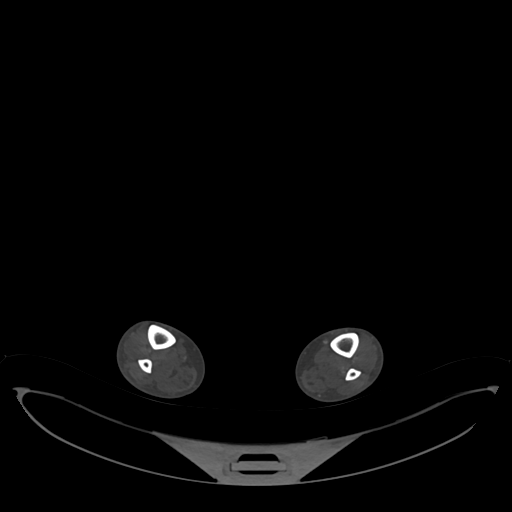
[im 879/1758  soft-tissue]
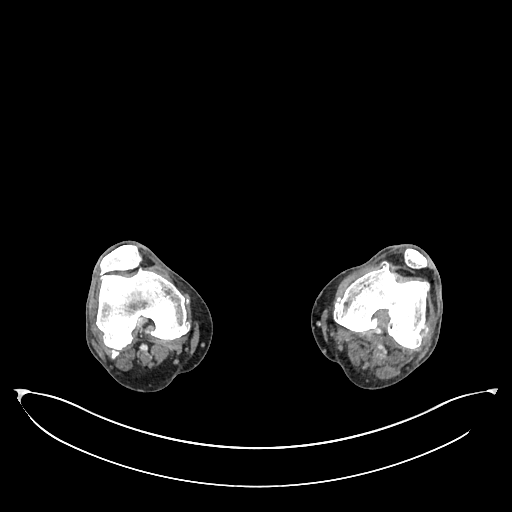
[im 1318/1758  soft-tissue]
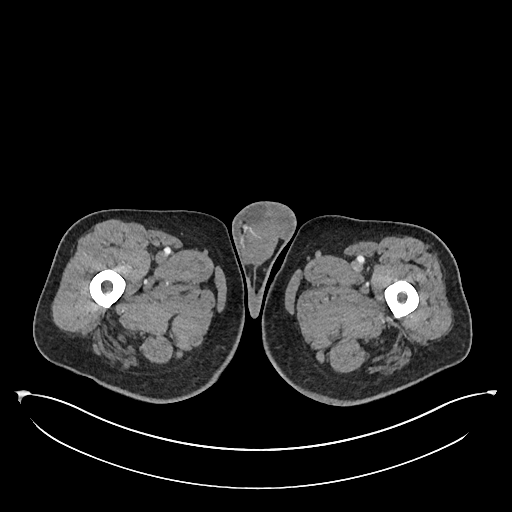

[3 of 16 positions shown; findings below may reference images not displayed]

FINDINGS: VASCULAR

Aorta: A moderate-to-severe burden of calcified and noncalcified
atheromatous disease is present circumferentially at the infrarenal
abdominal aorta, without aneurysmal dilation. Normal caliber aorta
without aneurysm, dissection, vasculitis or significant stenosis.

Celiac: A moderate burden of calcified atherosclerosis present at
the ostium superiorly, without CTA evidence of hemodynamically
stenosis. Patent without evidence of aneurysm, dissection, or
vasculitis.

SMA: Patent without evidence of aneurysm, dissection, vasculitis or
significant stenosis.

Renals: Single renal arteries are present bilaterally. A moderate
burden of calcified and noncalcified atheromatous disease present at
both ostia, greatest on RIGHT with focally/short segment significant
(> 50%) stenosis. See key image. No evidence of renal artery
aneurysm, dissection, vasculitis, or fibromuscular dysplasia.

IMA: A significant burden of atheromatous disease is present at the
IMA ostium. The vessel appears well opacified, suspected via
retrograde collaterals. No evidence of aneurysm, dissection, or
vasculitis.

RIGHT Lower Extremity

Inflow: A severe burden of calcified and noncalcified atheromatous
disease is present at the aortic bifurcation and proximal RIGHT CIA
with severe short segment stenosis (see key image). Severe,
short-segment RIGHT proximal hypogastric artery stenosis at its
origin. The external iliac artery is patent without evidence of
aneurysm, dissection, vasculitis or significant stenosis.

Outflow: A significant burden of calcified atheromatous disease is
present at the infra inguinal RIGHT CFA resulting in severe stenosis
and near-complete occlusion. See key image. Proximally diseased but
patent superficial and profunda femoral arteries. A moderate burden
of atheromatous disease is present at the distal RIGHT SFA, with at
least 50% stenosis at the adductor hiatus. The popliteal artery is
diseased but patent without evidence of aneurysm, dissection, or
vasculitis.

Runoff: Severe short segment stenosis versus partial occlusion of
the proximal RIGHT ATA, with additional severe short-segment
stenosis versus occlusion at the mid calf. See key image. Relatively
disease free RIGHT PTA, providing dominant supply, with 2 vessel
runoff to the RIGHT ankle.

Veins: No obvious venous abnormality within the limitations of this
arterial phase study.

Musculoskeletal: Significant RIGHT knee osteoarthritic changes,
greatest at the medial compartment. No acute osseous findings.

LEFT Lower Extremity

Inflow: A severe burden of calcified and noncalcified atheromatous
disease is present at the aortic bifurcation and proximal LEFT CIA.
Severe, short-segment LEFT proximal hypogastric artery stenosis at
its origin. See key image. The external iliac artery is patent
without evidence of aneurysm, dissection, vasculitis or significant
stenosis.

Outflow: A significant burden of calcified atheromatous disease is
present at the infra inguinal LEFT CFA resulting in severe stenosis
and near-complete occlusion. See key image. Proximally diseased but
patent superficial and profunda femoral arteries. A moderate burden
of atheromatous disease is present at the distal LEFT SFA, with at
least 50% stenosis at the adductor hiatus. The popliteal artery is
diseased but patent without evidence of aneurysm, dissection, or
vasculitis.

Runoff: Severe short segment stenosis versus partial occlusion of
the proximal LEFT ATA, with additional severe short-segment stenosis
versus occlusion at the mid calf. See key image. Poorly opacified
LEFT PTA, may be secondary to study limitations. Relatively disease
free LEFT peroneal artery, providing dominant supply, with at single
vessel runoff to the LEFT ankle and providing multiple collaterals
to medial and lateral plantar branches.

Veins: Notable early filling of the distal LEFT lower extremity
veins, suspicious for (potentially intentional revascularization)
AVF.

Musculoskeletal: Air-and-fluid containing subcutaneous intramuscular
collection about the LEFT knee, measuring up to 3.2 x 2.3 x 3.9 cm
and likely a popliteal/Baker's cyst. Significant LEFT knee
osteoarthritic changes, greatest of the medial compartment. No acute
osseous findings.

Review of the MIP images confirms the above findings.

NON-VASCULAR

Lower chest: Motion degraded.  No obvious abnormality.

Hepatobiliary: No focal liver abnormality is seen. No gallstones,
gallbladder wall thickening, or biliary dilatation.

Pancreas: Unremarkable. No pancreatic ductal dilatation or
surrounding inflammatory changes.

Spleen: Normal in size without focal abnormality.

Adrenals/Urinary Tract: Nodular thickening of the limbs of the LEFT
adrenal gland without discrete lesion. The RIGHT adrenal glands is
unremarkable. 4.5 cm RIGHT midpolar exophytic renal cyst. Kidneys
are otherwise normal, without renal calculi, focal lesion, or
hydronephrosis. Bladder is unremarkable.

Stomach/Bowel: Stomach is within normal limits. Appendix appears
normal. No evidence of bowel wall thickening, distention, or
inflammatory changes.

Lymphatic: No enlarged abdominal or pelvic lymph nodes.

Reproductive: Prostate is unremarkable.  RIGHT hydrocele.

Other: Small fat-containing umbilical hernia. Bilateral small
fat-containing inguinal hernias versus cord lipomas. No
abdominopelvic ascites.

Musculoskeletal: No acute osseous findings. Degenerative changes of
imaged spine, including relative straightening of the lumbar spine
L2 superior endplate Schmorl's node, L5-S1 disc space changes and
multilevel lower lumbar spine facet arthropathy with trace
retrolisthesis.
IMPRESSION: VASCULAR

1. Severe bilateral pelvic and lower extremity arterial disease,
with multiple notable findings as indicated above.
2. Severe infrarenal abdominal aortic atheromatous disease without
aneurysmal dilation.
3. Short segment RIGHT renal artery significant stenosis. Correlate
with renal artery duplex for RAS.

NON-VASCULAR

1. No acute abdominopelvic findings.
2. Severe LEFT knee osteoarthrosis, greatest at the medial
compartment.
3. Posterior LEFT knee subcutaneous air-and-fluid collection is
favored to represent a popliteal/Baker's cyst. If clinical concern,
consider focused ultrasound versus MRI for further evaluation.
4. Additional chronic and incidental findings, as above.

## 2022-10-17 ENCOUNTER — Other Ambulatory Visit: Payer: Self-pay | Admitting: Cardiology

## 2022-11-03 ENCOUNTER — Other Ambulatory Visit: Payer: Self-pay | Admitting: Cardiology

## 2022-11-28 ENCOUNTER — Other Ambulatory Visit: Payer: Self-pay | Admitting: Cardiology

## 2023-02-10 ENCOUNTER — Other Ambulatory Visit: Payer: Self-pay | Admitting: Cardiovascular Disease

## 2023-02-13 ENCOUNTER — Other Ambulatory Visit: Payer: Self-pay | Admitting: Vascular Surgery

## 2023-03-11 ENCOUNTER — Other Ambulatory Visit: Payer: Self-pay | Admitting: Cardiovascular Disease

## 2023-04-01 ENCOUNTER — Other Ambulatory Visit: Payer: Self-pay | Admitting: Cardiology

## 2023-04-25 ENCOUNTER — Other Ambulatory Visit: Payer: Self-pay | Admitting: Cardiology

## 2023-07-04 ENCOUNTER — Encounter (HOSPITAL_COMMUNITY): Payer: Self-pay

## 2023-07-04 ENCOUNTER — Inpatient Hospital Stay (HOSPITAL_COMMUNITY)
Admission: EM | Admit: 2023-07-04 | Discharge: 2023-07-09 | DRG: 871 | Disposition: A | Discharge status: Home / Self Care | Attending: Internal Medicine | Admitting: Internal Medicine

## 2023-07-04 ENCOUNTER — Emergency Department (HOSPITAL_COMMUNITY)

## 2023-07-04 ENCOUNTER — Other Ambulatory Visit: Payer: Self-pay

## 2023-07-04 DIAGNOSIS — Z7984 Long term (current) use of oral hypoglycemic drugs: Secondary | ICD-10-CM | POA: Diagnosis not present

## 2023-07-04 DIAGNOSIS — N12 Tubulo-interstitial nephritis, not specified as acute or chronic: Secondary | ICD-10-CM | POA: Diagnosis present

## 2023-07-04 DIAGNOSIS — N400 Enlarged prostate without lower urinary tract symptoms: Secondary | ICD-10-CM | POA: Diagnosis present

## 2023-07-04 DIAGNOSIS — I739 Peripheral vascular disease, unspecified: Secondary | ICD-10-CM | POA: Diagnosis present

## 2023-07-04 DIAGNOSIS — K219 Gastro-esophageal reflux disease without esophagitis: Secondary | ICD-10-CM | POA: Diagnosis present

## 2023-07-04 DIAGNOSIS — X58XXXA Exposure to other specified factors, initial encounter: Secondary | ICD-10-CM | POA: Diagnosis present

## 2023-07-04 DIAGNOSIS — Z8249 Family history of ischemic heart disease and other diseases of the circulatory system: Secondary | ICD-10-CM | POA: Diagnosis not present

## 2023-07-04 DIAGNOSIS — J9811 Atelectasis: Secondary | ICD-10-CM | POA: Diagnosis not present

## 2023-07-04 DIAGNOSIS — J438 Other emphysema: Secondary | ICD-10-CM | POA: Diagnosis present

## 2023-07-04 DIAGNOSIS — E875 Hyperkalemia: Secondary | ICD-10-CM | POA: Diagnosis present

## 2023-07-04 DIAGNOSIS — I10 Essential (primary) hypertension: Secondary | ICD-10-CM | POA: Diagnosis present

## 2023-07-04 DIAGNOSIS — I2510 Atherosclerotic heart disease of native coronary artery without angina pectoris: Secondary | ICD-10-CM | POA: Diagnosis present

## 2023-07-04 DIAGNOSIS — E1165 Type 2 diabetes mellitus with hyperglycemia: Secondary | ICD-10-CM | POA: Diagnosis present

## 2023-07-04 DIAGNOSIS — N179 Acute kidney failure, unspecified: Secondary | ICD-10-CM | POA: Diagnosis present

## 2023-07-04 DIAGNOSIS — Z1152 Encounter for screening for COVID-19: Secondary | ICD-10-CM

## 2023-07-04 DIAGNOSIS — Z794 Long term (current) use of insulin: Secondary | ICD-10-CM

## 2023-07-04 DIAGNOSIS — E66811 Obesity, class 1: Secondary | ICD-10-CM | POA: Diagnosis present

## 2023-07-04 DIAGNOSIS — A4181 Sepsis due to Enterococcus: Secondary | ICD-10-CM | POA: Diagnosis present

## 2023-07-04 DIAGNOSIS — Z6831 Body mass index (BMI) 31.0-31.9, adult: Secondary | ICD-10-CM | POA: Diagnosis not present

## 2023-07-04 DIAGNOSIS — R6520 Severe sepsis without septic shock: Secondary | ICD-10-CM | POA: Diagnosis present

## 2023-07-04 DIAGNOSIS — E119 Type 2 diabetes mellitus without complications: Secondary | ICD-10-CM

## 2023-07-04 DIAGNOSIS — G4733 Obstructive sleep apnea (adult) (pediatric): Secondary | ICD-10-CM | POA: Diagnosis present

## 2023-07-04 DIAGNOSIS — Z85820 Personal history of malignant melanoma of skin: Secondary | ICD-10-CM

## 2023-07-04 DIAGNOSIS — Z833 Family history of diabetes mellitus: Secondary | ICD-10-CM

## 2023-07-04 DIAGNOSIS — A4151 Sepsis due to Escherichia coli [E. coli]: Secondary | ICD-10-CM | POA: Diagnosis present

## 2023-07-04 DIAGNOSIS — Z885 Allergy status to narcotic agent status: Secondary | ICD-10-CM

## 2023-07-04 DIAGNOSIS — S22081A Stable burst fracture of T11-T12 vertebra, initial encounter for closed fracture: Secondary | ICD-10-CM | POA: Diagnosis present

## 2023-07-04 DIAGNOSIS — A419 Sepsis, unspecified organism: Principal | ICD-10-CM

## 2023-07-04 DIAGNOSIS — Z79899 Other long term (current) drug therapy: Secondary | ICD-10-CM

## 2023-07-04 DIAGNOSIS — K760 Fatty (change of) liver, not elsewhere classified: Secondary | ICD-10-CM | POA: Diagnosis present

## 2023-07-04 DIAGNOSIS — Z87891 Personal history of nicotine dependence: Secondary | ICD-10-CM

## 2023-07-04 DIAGNOSIS — I700 Atherosclerosis of aorta: Secondary | ICD-10-CM | POA: Diagnosis present

## 2023-07-04 DIAGNOSIS — E7800 Pure hypercholesterolemia, unspecified: Secondary | ICD-10-CM | POA: Diagnosis present

## 2023-07-04 DIAGNOSIS — J9601 Acute respiratory failure with hypoxia: Secondary | ICD-10-CM | POA: Diagnosis present

## 2023-07-04 DIAGNOSIS — Y929 Unspecified place or not applicable: Secondary | ICD-10-CM | POA: Diagnosis not present

## 2023-07-04 DIAGNOSIS — Z7982 Long term (current) use of aspirin: Secondary | ICD-10-CM

## 2023-07-04 DIAGNOSIS — E1151 Type 2 diabetes mellitus with diabetic peripheral angiopathy without gangrene: Secondary | ICD-10-CM | POA: Diagnosis present

## 2023-07-04 DIAGNOSIS — Z91040 Latex allergy status: Secondary | ICD-10-CM

## 2023-07-04 DIAGNOSIS — Z7902 Long term (current) use of antithrombotics/antiplatelets: Secondary | ICD-10-CM

## 2023-07-04 DIAGNOSIS — E872 Acidosis, unspecified: Secondary | ICD-10-CM | POA: Diagnosis present

## 2023-07-04 LAB — BLOOD CULTURE ID PANEL (REFLEXED) - BCID2

## 2023-07-04 LAB — CBC WITH DIFFERENTIAL/PLATELET
Abs Immature Granulocytes: 0.82 10*3/uL — ABNORMAL HIGH (ref 0.00–0.07)
Basophils Absolute: 0.1 10*3/uL (ref 0.0–0.1)
Basophils Relative: 0 %
Eosinophils Absolute: 0.1 10*3/uL (ref 0.0–0.5)
Eosinophils Relative: 0 %
HCT: 41.3 % (ref 39.0–52.0)
Hemoglobin: 13.4 g/dL (ref 13.0–17.0)
Immature Granulocytes: 4 %
Lymphocytes Relative: 5 %
Lymphs Abs: 1.1 10*3/uL (ref 0.7–4.0)
MCH: 28.5 pg (ref 26.0–34.0)
MCHC: 32.4 g/dL (ref 30.0–36.0)
MCV: 87.9 fL (ref 80.0–100.0)
Monocytes Absolute: 0.7 10*3/uL (ref 0.1–1.0)
Monocytes Relative: 3 %
Neutro Abs: 17.9 10*3/uL — ABNORMAL HIGH (ref 1.7–7.7)
Neutrophils Relative %: 88 %
Platelets: 173 10*3/uL (ref 150–400)
RBC: 4.7 MIL/uL (ref 4.22–5.81)
RDW: 14.4 % (ref 11.5–15.5)
Smear Review: NORMAL
WBC: 20.6 10*3/uL — ABNORMAL HIGH (ref 4.0–10.5)
nRBC: 0 % (ref 0.0–0.2)

## 2023-07-04 LAB — I-STAT CG4 LACTIC ACID, ED
Lactic Acid, Venous: 6.1 mmol/L (ref 0.5–1.9)
Lactic Acid, Venous: 3.4 mmol/L (ref 0.5–1.9)

## 2023-07-04 LAB — COMPREHENSIVE METABOLIC PANEL
ALT: 29 U/L (ref 0–44)
AST: 36 U/L (ref 15–41)
Albumin: 2.9 g/dL — ABNORMAL LOW (ref 3.5–5.0)
Alkaline Phosphatase: 52 U/L (ref 38–126)
Anion gap: 13 (ref 5–15)
BUN: 44 mg/dL — ABNORMAL HIGH (ref 8–23)
CO2: 16 mmol/L — ABNORMAL LOW (ref 22–32)
Calcium: 7.3 mg/dL — ABNORMAL LOW (ref 8.9–10.3)
Chloride: 106 mmol/L (ref 98–111)
Creatinine, Ser: 3.17 mg/dL — ABNORMAL HIGH (ref 0.61–1.24)
GFR, Estimated: 20 mL/min — ABNORMAL LOW (ref >60)
Glucose, Bld: 232 mg/dL — ABNORMAL HIGH (ref 70–99)
Potassium: 6.2 mmol/L — ABNORMAL HIGH (ref 3.5–5.1)
Sodium: 135 mmol/L (ref 135–145)
Total Bilirubin: 0.5 mg/dL (ref 0.0–1.2)
Total Protein: 5.8 g/dL — ABNORMAL LOW (ref 6.5–8.1)

## 2023-07-04 LAB — BASIC METABOLIC PANEL
Anion gap: 14 (ref 5–15)
BUN: 44 mg/dL — ABNORMAL HIGH (ref 8–23)
CO2: 17 mmol/L — ABNORMAL LOW (ref 22–32)
Calcium: 8.1 mg/dL — ABNORMAL LOW (ref 8.9–10.3)
Chloride: 106 mmol/L (ref 98–111)
Creatinine, Ser: 2.69 mg/dL — ABNORMAL HIGH (ref 0.61–1.24)
GFR, Estimated: 24 mL/min — ABNORMAL LOW (ref >60)
Glucose, Bld: 197 mg/dL — ABNORMAL HIGH (ref 70–99)
Potassium: 5.2 mmol/L — ABNORMAL HIGH (ref 3.5–5.1)
Sodium: 137 mmol/L (ref 135–145)

## 2023-07-04 LAB — URINALYSIS, W/ REFLEX TO CULTURE (INFECTION SUSPECTED)
Bilirubin Urine: NEGATIVE
Glucose, UA: >=500 mg/dL — AB
Ketones, ur: 5 mg/dL — AB
Nitrite: NEGATIVE
Protein, ur: 100 mg/dL — AB
RBC / HPF: >50 RBC/hpf (ref 0–5)
Specific Gravity, Urine: 1.018 (ref 1.005–1.030)
WBC, UA: >50 WBC/hpf (ref 0–5)
pH: 5 (ref 5.0–8.0)

## 2023-07-04 LAB — RESP PANEL BY RT-PCR (RSV, FLU A&B, COVID)  RVPGX2
Influenza A by PCR: NEGATIVE
Influenza B by PCR: NEGATIVE
Resp Syncytial Virus by PCR: NEGATIVE
SARS Coronavirus 2 by RT PCR: NEGATIVE

## 2023-07-04 LAB — HEMOGLOBIN A1C
Hgb A1c MFr Bld: 7.8 % — ABNORMAL HIGH (ref 4.8–5.6)
Mean Plasma Glucose: 177.16 mg/dL

## 2023-07-04 LAB — GLUCOSE, CAPILLARY
Glucose-Capillary: 138 mg/dL — ABNORMAL HIGH (ref 70–99)
Glucose-Capillary: 175 mg/dL — ABNORMAL HIGH (ref 70–99)

## 2023-07-04 LAB — APTT: aPTT: 33 s (ref 24–36)

## 2023-07-04 LAB — PROTIME-INR
INR: 1.3 — ABNORMAL HIGH (ref 0.8–1.2)
Prothrombin Time: 16.2 s — ABNORMAL HIGH (ref 11.4–15.2)

## 2023-07-04 LAB — CBG MONITORING, ED: Glucose-Capillary: 186 mg/dL — ABNORMAL HIGH (ref 70–99)

## 2023-07-04 LAB — LIPASE, BLOOD: Lipase: 16 U/L (ref 11–51)

## 2023-07-04 MED ORDER — ONDANSETRON HCL 4 MG/2ML IJ SOLN: 4.0000 mg | Freq: Four times a day (QID) | INTRAMUSCULAR | Status: DC | PRN

## 2023-07-04 MED ORDER — CLOPIDOGREL BISULFATE 75 MG PO TABS
75.0000 mg | ORAL_TABLET | Freq: Every day | ORAL | Status: DC
  Administered 2023-07-04 – 2023-07-09 (×6): 75 mg via ORAL
  Filled 2023-07-04 (×6): qty 1

## 2023-07-04 MED ORDER — INSULIN ASPART 100 UNIT/ML IJ SOLN
0.0000 [IU] | INTRAMUSCULAR | Status: DC
  Administered 2023-07-04: 3 [IU] via SUBCUTANEOUS
  Administered 2023-07-04 – 2023-07-05 (×2): 2 [IU] via SUBCUTANEOUS
  Administered 2023-07-05: 1 [IU] via SUBCUTANEOUS
  Administered 2023-07-05 (×2): 2 [IU] via SUBCUTANEOUS
  Administered 2023-07-05: 3 [IU] via SUBCUTANEOUS
  Administered 2023-07-05: 1 [IU] via SUBCUTANEOUS
  Administered 2023-07-06 (×3): 2 [IU] via SUBCUTANEOUS
  Administered 2023-07-06: 1 [IU] via SUBCUTANEOUS
  Administered 2023-07-06: 2 [IU] via SUBCUTANEOUS
  Administered 2023-07-07: 5 [IU] via SUBCUTANEOUS
  Administered 2023-07-07 – 2023-07-08 (×6): 2 [IU] via SUBCUTANEOUS
  Administered 2023-07-08: 5 [IU] via SUBCUTANEOUS
  Administered 2023-07-08: 2 [IU] via SUBCUTANEOUS
  Administered 2023-07-08 (×2): 5 [IU] via SUBCUTANEOUS
  Administered 2023-07-09 (×2): 2 [IU] via SUBCUTANEOUS
  Administered 2023-07-09: 1 [IU] via SUBCUTANEOUS

## 2023-07-04 MED ORDER — PANTOPRAZOLE SODIUM 40 MG IV SOLR
40.0000 mg | INTRAVENOUS | Status: DC
  Administered 2023-07-04: 40 mg via INTRAVENOUS
  Filled 2023-07-04: qty 10

## 2023-07-04 MED ORDER — OXYCODONE HCL 5 MG PO TABS: 5.0000 mg | ORAL_TABLET | ORAL | Status: DC | PRN

## 2023-07-04 MED ORDER — LACTATED RINGERS IV SOLN: INTRAVENOUS | Status: AC

## 2023-07-04 MED ORDER — ALBUTEROL SULFATE (2.5 MG/3ML) 0.083% IN NEBU
2.5000 mg | INHALATION_SOLUTION | Freq: Four times a day (QID) | RESPIRATORY_TRACT | Status: DC | PRN
  Administered 2023-07-05 – 2023-07-07 (×3): 2.5 mg via RESPIRATORY_TRACT
  Filled 2023-07-04 (×3): qty 3

## 2023-07-04 MED ORDER — MELATONIN 5 MG PO TABS
10.0000 mg | ORAL_TABLET | Freq: Every evening | ORAL | Status: DC | PRN
  Administered 2023-07-04 – 2023-07-08 (×5): 10 mg via ORAL
  Filled 2023-07-04 (×5): qty 2

## 2023-07-04 MED ORDER — METRONIDAZOLE 500 MG/100ML IV SOLN
500.0000 mg | Freq: Two times a day (BID) | INTRAVENOUS | Status: DC
  Administered 2023-07-04: 500 mg via INTRAVENOUS
  Filled 2023-07-04: qty 100

## 2023-07-04 MED ORDER — ACETAMINOPHEN 325 MG PO TABS
650.0000 mg | ORAL_TABLET | Freq: Four times a day (QID) | ORAL | Status: DC | PRN
  Administered 2023-07-05 – 2023-07-08 (×10): 650 mg via ORAL
  Filled 2023-07-04 (×11): qty 2

## 2023-07-04 MED ORDER — FINASTERIDE 5 MG PO TABS
5.0000 mg | ORAL_TABLET | Freq: Every evening | ORAL | Status: DC
  Administered 2023-07-04 – 2023-07-08 (×5): 5 mg via ORAL
  Filled 2023-07-04 (×5): qty 1

## 2023-07-04 MED ORDER — ACETAMINOPHEN 650 MG RE SUPP: 650.0000 mg | Freq: Four times a day (QID) | RECTAL | Status: DC | PRN

## 2023-07-04 MED ORDER — SODIUM CHLORIDE 0.9 % IV SOLN
2.0000 g | Freq: Once | INTRAVENOUS | Status: AC
  Administered 2023-07-04: 2 g via INTRAVENOUS
  Filled 2023-07-04: qty 12.5

## 2023-07-04 MED ORDER — SODIUM CHLORIDE 0.9 % IV BOLUS (SEPSIS)
1000.0000 mL | Freq: Once | INTRAVENOUS | Status: AC
  Administered 2023-07-04: 1000 mL via INTRAVENOUS

## 2023-07-04 MED ORDER — HEPARIN SODIUM (PORCINE) 5000 UNIT/ML IJ SOLN
5000.0000 [IU] | Freq: Three times a day (TID) | INTRAMUSCULAR | Status: DC
  Administered 2023-07-04 – 2023-07-09 (×15): 5000 [IU] via SUBCUTANEOUS
  Filled 2023-07-04 (×15): qty 1

## 2023-07-04 MED ORDER — METRONIDAZOLE 500 MG/100ML IV SOLN
500.0000 mg | Freq: Once | INTRAVENOUS | Status: AC
  Administered 2023-07-04: 500 mg via INTRAVENOUS
  Filled 2023-07-04: qty 100

## 2023-07-04 MED ORDER — CALCIUM GLUCONATE-NACL 1-0.675 GM/50ML-% IV SOLN
1.0000 g | Freq: Once | INTRAVENOUS | Status: AC
  Administered 2023-07-04: 1000 mg via INTRAVENOUS
  Filled 2023-07-04: qty 50

## 2023-07-04 MED ORDER — SODIUM ZIRCONIUM CYCLOSILICATE 10 G PO PACK
10.0000 g | PACK | Freq: Once | ORAL | Status: AC
  Administered 2023-07-04: 10 g via ORAL
  Filled 2023-07-04: qty 1

## 2023-07-04 MED ORDER — ONDANSETRON HCL 4 MG PO TABS: 4.0000 mg | ORAL_TABLET | Freq: Four times a day (QID) | ORAL | Status: DC | PRN

## 2023-07-04 MED ORDER — TAMSULOSIN HCL 0.4 MG PO CAPS
0.4000 mg | ORAL_CAPSULE | Freq: Two times a day (BID) | ORAL | Status: DC
Start: 2023-07-04 — End: 2023-07-09
  Administered 2023-07-04 – 2023-07-09 (×10): 0.4 mg via ORAL
  Filled 2023-07-04 (×11): qty 1

## 2023-07-04 MED ORDER — ASPIRIN 81 MG PO TBEC
81.0000 mg | DELAYED_RELEASE_TABLET | Freq: Every day | ORAL | Status: DC
  Administered 2023-07-04 – 2023-07-09 (×6): 81 mg via ORAL
  Filled 2023-07-04 (×6): qty 1

## 2023-07-04 MED ORDER — SODIUM CHLORIDE 0.9 % IV SOLN: 2.0000 g | INTRAVENOUS | Status: DC

## 2023-07-04 MED ORDER — SODIUM CHLORIDE 0.9 % IV SOLN
2.0000 g | INTRAVENOUS | Status: DC
  Administered 2023-07-05 – 2023-07-07 (×3): 2 g via INTRAVENOUS
  Filled 2023-07-04 (×3): qty 20

## 2023-07-04 NOTE — Hospital Course (Addendum)
 73 year old male with past medical history of type 2 diabetes mellitus on insulin 70/30/Jardiance/metformin, hypertension and Aldactone/Diovan HCT Coreg amlodipine, hyperlipidemia, peripheral artery disease presents to the ED with complaint of lower quadrant abdominal pain 07/03/23  morning around 0400 along with nausea vomiting and diarrhea. He denies any recent fall or fracture. He lives home with his son and ambulatory at baseline and does his yard. Patient otherwise denies any chest pain, shortness of breath, fever, chills, headache, focal weakness, numbness tingling, speech difficulties. No bowel movements since am. On arrival EMS found patient to be hypotensive with BP in 70s In the ED: Tachycardic 104 BP 89/47, 91% on room air subsequently on 2 L nasal cannula. Labs showed acute renal failure with hyperkalemia 6.2 creatinine 3.17, lactic acidosis 6.1 leukocytosis 20.6.  Influenza COVID RSV negative. CXR> bibasilar patchy and linear opacities left greater than right likely atelectasis, aspiration or pneumonia can be considered. CT chest abdomen pelvis without contrast> acute burst fracture of T12 with 4 mm osseous retropulsion and approximately 60% loss of vertebral body height, new symmetric perinephric soft tissue stranding on left with a year in the left intrarenal collecting system and bladder, small amount of retroperitoneal soft tissue emphysema, in the absence of recent instrumentation findings suspicious for emphysematous no apparent nephritis.  No drainable fluid collection.  Hepatic steatosis and aortic atherosclerosis in addition.  No other acute finding on chest abdominal pelvis. Patient was given bolus of NS 3 L, Lokelma, cefepime, Flagyl calcium gluconate Nephrology consulted and admission requested.

## 2023-07-04 NOTE — Progress Notes (Signed)
 New Admission from ED for Sepsis  07/04/23 1845  Assess: MEWS Score  Temp 98.8 F (37.1 C)  BP (!) 154/62  MAP (mmHg) 88  Pulse Rate (!) 107  Resp (!) 30  Level of Consciousness Alert  SpO2 95 %  O2 Device Nasal Cannula  O2 Flow Rate (L/min) 4 L/min  Assess: MEWS Score  MEWS Temp 0  MEWS Systolic 0  MEWS Pulse 1  MEWS RR 2  MEWS LOC 0  MEWS Score 3  MEWS Score Color Yellow  Assess: if the MEWS score is Yellow or Red  Were vital signs accurate and taken at a resting state? Yes  Does the patient meet 2 or more of the SIRS criteria? No  MEWS guidelines implemented  Yes, yellow  Treat  MEWS Interventions Considered administering scheduled or prn medications/treatments as ordered  Take Vital Signs  Increase Vital Sign Frequency  Yellow: Q2hr x1, continue Q4hrs until patient remains green for 12hrs  Escalate  MEWS: Escalate Yellow: Discuss with charge nurse and consider notifying provider and/or RRT  Assess: SIRS CRITERIA  SIRS Temperature  0  SIRS Respirations  1  SIRS Pulse 1  SIRS WBC 1  SIRS Score Sum  3

## 2023-07-04 NOTE — H&P (Signed)
 History and Physical    Todd Krause WUJ:[id number] DOB: 06-Sep-1950 DOA: 07/04/2023  PCP: Ladora Daniel, PA-C   Patient coming from: Home  Chief Complaint  Patient presents with   Abdominal Pain  HPI: 73 year old male with past medical history of type 2 diabetes mellitus on insulin 70/30/Jardiance/metformin, hypertension and Aldactone/Diovan HCT Coreg amlodipine, hyperlipidemia, peripheral artery disease presents to the ED with complaint of lower quadrant abdominal pain 07/03/23  morning around 0400 along with nausea vomiting and diarrhea. He denies any recent fall or fracture. He lives home with his son and ambulatory at baseline and does his yard. Patient otherwise denies any chest pain, shortness of breath, fever, chills, headache, focal weakness, numbness tingling, speech difficulties. No bowel movements since am. On arrival EMS found patient to be hypotensive with BP in 70s In the ED: Tachycardic 104 BP 89/47, 91% on room air subsequently on 2 L nasal cannula. Labs showed acute renal failure with hyperkalemia 6.2 creatinine 3.17, lactic acidosis 6.1 leukocytosis 20.6.  Influenza COVID RSV negative. CXR> bibasilar patchy and linear opacities left greater than right likely atelectasis, aspiration or pneumonia can be considered. CT chest abdomen pelvis without contrast> acute burst fracture of T12 with 4 mm osseous retropulsion and approximately 60% loss of vertebral body height, new symmetric perinephric soft tissue stranding on left with a year in the left intrarenal collecting system and bladder, small amount of retroperitoneal soft tissue emphysema, in the absence of recent instrumentation findings suspicious for emphysematous no apparent nephritis.  No drainable fluid collection.  Hepatic steatosis and aortic atherosclerosis in addition.  No other acute finding on chest abdominal pelvis. Patient was given bolus of NS 3 L, Lokelma, cefepime, Flagyl calcium gluconate Nephrology consulted  and admission requested.    Assessment/Plan Principal Problem:   Acute renal failure (HCC) Active Problems:   HYPERCHOLESTEROLEMIA   Essential hypertension   Type 2 diabetes mellitus without complication, with long-term current use of insulin (HCC)   Peripheral arterial disease (HCC)  Severe Sepsis Nausea vomiting abdomen pain Emphysematous Pyelonephritis: Lactic acidosis and hypotension on presentation, la down in 3.4 after ivf.  UA pending, no other acute finding besides concern for emphysematous pyelonephritis.,  Continue aggressive IV fluid hydration, Protonix, antiemetics, pain control Continue IV cefepime Flagyl and follow-up blood culture, UA.  AKI Hyperkalemia Metabolic acidosis: S/p temporizing measures in the ED, nephrology consulted keep on IV fluid, monitor output,Avoid to minimize nephrotoxic medication. Repeat BMP to monitor potassium.  Acute burst fracture T12 : No trauma per history, non tender and no back pain.  Continue pain control.may need IR/neuro eval.  2 diabetes mellitus w/ hyperglycemia: PTA on insulin Jardiance metformin.  Will hold home meds, add sliding scale insulin.  Check A1c and adjust insulin  Hypertension PTA on Aldactone/Diovan HCT Coreg amlodipine. Hold antihypertensive in the setting of sepsis.  Hyperlipidemia: Hold statins  Peripheral artery disease w/ hx of stent in rt leg 1 year ago: PTA on plavix/asa-resume.  Hepatic steatosis in CT; Follow-up outpatient advised weight loss Body mass index is 31.93 kg/m.   Severity of Illness: The appropriate patient status for this patient is INPATIENT. Inpatient status is judged to be reasonable and necessary in order to provide the required intensity of service to ensure the patient's safety. The patient's presenting symptoms, physical exam findings, and initial radiographic and laboratory data in the context of their chronic comorbidities is felt to place them at high risk for further clinical  deterioration. Furthermore, it is not anticipated that the patient  will be medically stable for discharge from the hospital within 2 midnights of admission.   * I certify that at the point of admission it is my clinical judgment that the patient will require inpatient hospital care spanning beyond 2 midnights from the point of admission due to high intensity of service, high risk for further deterioration and high frequency of surveillance required.*   DVT prophylaxis: heparin injection 5,000 Units Start: 07/04/23 1530 Code Status:   Code Status: Full Code  Family Communication: Admission, patients condition and plan of care including tests being ordered have been discussed with the patient and son called on phone- who indicate understanding and agree with the plan and Code Status.  Consults called:  Nephrology  Review of Systems: All systems were reviewed and were negative except as mentioned in HPI above. Negative for fever Negative for chest pain Negative for shortness of breath  Past Medical History:  Diagnosis Date   Arthritis    hands, knees   BPH (benign prostatic hypertrophy)    Chronic bronchitis (HCC)    Coronary artery disease    ED (erectile dysfunction)    Elevated PSA    GERD (gastroesophageal reflux disease)    Hepatitis    when in High School in the 60's. Does not know which one it was. No current issues   History of colon polyps    History of hiatal hernia    Hyperlipidemia    Hypertension    Left bundle branch block (LBBB)    Lower urinary tract symptoms (LUTS)    OSA (obstructive sleep apnea)    mild to moderate osa per study 02-18-2004--  no cpap   Peripheral vascular disease (HCC)    Prostate nodule    Type 2 diabetes mellitus (HCC)     Past Surgical History:  Procedure Laterality Date   ABDOMINAL AORTOGRAM W/LOWER EXTREMITY N/A 02/06/2021   Procedure: ABDOMINAL AORTOGRAM W/LOWER EXTREMITY;  Surgeon: [name], MD;  Location: [facility];  Service: Cardiovascular;  Laterality: N/A;   AORTOGRAM N/A 04/28/2021   Procedure: AORTOGRAM WITH ILIAC ARTERIOGRAM;  Surgeon: [name], MD;  Location: [facility] OR;  Service: Vascular;  Laterality: N/A;   CARDIAC CATHETERIZATION  11-21-2009  dr Christiane Ha berry   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,  50% mCFX,  mild pulmonary HTN/  preserved LV , ef >60%   COLONOSCOPY  last one -- april 2016   ENDARTERECTOMY FEMORAL Right 04/28/2021   Procedure: RIGHT COMMON FEMORAL ARTERY ENDARTERECTOMY;  Surgeon: [name], MD;  Location: [facility] OR;  Service: Vascular;  Laterality: Right;   EYE SURGERY Bilateral    iol lens for cataracts   INSERTION OF ILIAC STENT Right 04/28/2021   Procedure: RIGHT COMMON ILIAC ARTERY ANGIOPLASTY WITH INSERTION OF RIGHT ILIAC STENT;  Surgeon: [name], MD;  Location: [facility];  Service: Vascular;  Laterality: Right;   melanoma removed from back  15 yrs ago   PATCH ANGIOPLASTY Right 04/28/2021   Procedure: RIGHT COMMON FEMORAL PATCH ANGIOPLASTY;  Surgeon: [name], MD;  Location: [facility];  Service: Vascular;  Laterality: Right;   PROSTATE BIOPSY N/A 10/01/2014   Procedure: BIOPSY TRANSRECTAL ULTRASONIC PROSTATE (TUBP);  Surgeon: Jethro Bolus, MD;  Location: [facility];  Service: Urology;  Laterality: N/A;   TOOTH EXTRACTION N/A 05/08/2022   Procedure: DENTAL RESTORATION/EXTRACTIONS;  Surgeon: Ocie Doyne, DMD;  Location: [facility] OR;  Service: Oral Surgery;  Laterality: N/A;   TRANSTHORACIC  ECHOCARDIOGRAM  11-20-2009   grade I diastolic dysfunction/  ef 60-65%/  mild AR, MR, and TR/  trivial PR   TRANSURETHRAL RESECTION OF PROSTATE N/A 05/09/2015   Procedure: CYSTOSCOPY TRANSURETHRAL RESECTION OF THE PROSTATE (TURP);  Surgeon: Jethro Bolus, MD;  Location: [facility];  Service: Urology;  Laterality: N/A;     reports that he quit smoking about 16 years ago. His smoking use included cigarettes. He started smoking about  46 years ago. He has a 30 pack-year smoking history. He has never used smokeless tobacco. He reports that he does not currently use alcohol. He reports that he does not use drugs.  Allergies  Allergen Reactions   Latex Hives   Tramadol Rash   Codeine Other (See Comments)    constipation    Family History  Problem Relation Age of Onset   Diabetes Mother    Cancer Mother    Diabetes Father    Heart attack Father    Colon cancer Neg Hx    Colon polyps Neg Hx    Kidney disease Neg Hx    Gallbladder disease Neg Hx    Esophageal cancer Neg Hx      Prior to Admission medications   Medication Sig Start Date End Date Taking? Authorizing Provider  acetaminophen (TYLENOL) 500 MG tablet Take 1,000 mg by mouth every 6 (six) hours as needed for moderate pain.   Yes [provider]  albuterol (PROVENTIL) (2.5 MG/3ML) 0.083% nebulizer solution Take 2.5 mg by nebulization every 6 (six) hours as needed for wheezing or shortness of breath.   Yes [provider]  albuterol (VENTOLIN HFA) 108 (90 Base) MCG/ACT inhaler Inhale 2 puffs into the lungs every 4 (four) hours as needed for shortness of breath or wheezing.   Yes [provider]  amLODipine (NORVASC) 10 MG tablet Take 10 mg by mouth at bedtime. 12/02/20  Yes [provider]  aspirin EC 81 MG EC tablet Take 1 tablet (81 mg total) by mouth daily at 6 (six) AM. Swallow whole. Patient taking differently: Take 81 mg by mouth at bedtime. Swallow whole. 05/01/21  Yes Rhyne, Samantha J, PA-C  atorvastatin (LIPITOR) 20 MG tablet Take 20 mg by mouth 3 (three) times a week. In the evening 02/08/20  Yes [provider]  carvedilol (COREG) 12.5 MG tablet TAKE 1 TABLET TWICE DAILY WITH A MEAL (CONTACT OFFICE AND MAKE APPOINTMENT OR GET REFILL FROM PCP) 02/11/23  Yes [name], MD  celecoxib (CELEBREX) 200 MG capsule Take 200 mg by mouth 2 (two) times daily. 09/07/20  Yes [provider]  Cholecalciferol  (VITAMIN D3) 2000 UNITS TABS Take 2,000 Units by mouth every evening.   Yes [provider]  clopidogrel (PLAVIX) 75 MG tablet TAKE 1 TABLET EVERY DAY  AT  6AM 02/12/22  Yes Maeola Harman, MD  empagliflozin (JARDIANCE) 25 MG TABS tablet Take 25 mg by mouth daily. 10/30/21  Yes [provider]  fenofibrate 54 MG tablet Take 54 mg by mouth daily.   Yes [provider]  finasteride (PROSCAR) 5 MG tablet Take 1 tablet (5 mg total) by mouth daily. Patient taking differently: Take 5 mg by mouth every evening. 01/11/15  Yes Gherghe, Daylene Katayama, MD  HUMALOG MIX 75/25 KWIKPEN (75-25) 100 UNIT/ML KwikPen Inject 15-20 Units into the skin See admin instructions. Inject 20 units into the skin in the morning and 15 units in the evening. 05/23/23  Yes [provider]  metFORMIN (GLUCOPHAGE) 1000  MG tablet Take 1,000 mg by mouth 2 (two) times daily. 08/03/14  Yes [provider]  Multiple Vitamin (MULTIVITAMIN) tablet Take 1 tablet by mouth every evening.    Yes [provider]  naproxen sodium (ANAPROX) 220 MG tablet Take 440 mg by mouth every 12 (twelve) hours as needed (pain).   Yes [provider]  omeprazole (PRILOSEC) 40 MG capsule Take 40 mg by mouth 2 (two) times daily.   Yes [provider]  Red Yeast Rice 600 MG CAPS Take 600 mg by mouth every evening.    Yes [provider]  spironolactone (ALDACTONE) 25 MG tablet TAKE 1 TABLET EVERY DAY (NEED CARDIOLOGY APPOINTMENT FOR FURTHER REFILLS) 04/25/23  Yes Little Ishikawa, MD  tamsulosin (FLOMAX) 0.4 MG CAPS capsule Take 0.4 mg by mouth 2 (two) times daily. 11/01/20  Yes [provider]  valsartan-hydrochlorothiazide (DIOVAN HCT) 320-25 MG tablet Take 0.5 tablets by mouth daily. Patient taking differently: Take 1 tablet by mouth daily. 05/03/21  Yes [name], MD  vitamin B-12 (CYANOCOBALAMIN) 1000 MCG tablet Take 1,000 mcg by mouth every evening.    Yes  [provider]    Physical Exam: Vitals:   07/04/23 1030 07/04/23 1233 07/04/23 1300 07/04/23 1330  BP: (!) 107/52 (!) 117/57 121/60 122/61  Pulse: (!) 102 99 (!) 103 100  Resp: (!) 26 (!) 23 (!) 26 (!) 25  Temp:  97.8 F (36.6 C)    TempSrc:  Oral    SpO2: 96% 95% 95% 96%  Weight:      Height:        General exam: AAOx3 , NAD, weak appearing. HEENT:Oral mucosa moist, Ear/Nose WNL grossly, dentition normal. Respiratory system: bilaterally clear,no wheezing or crackles,no use of accessory muscle Cardiovascular system: S1 & S2 +, No JVD,. Gastrointestinal system: Abdomen soft, NT,ND, BS+ tender left flank. Nervous System:Alert, awake, moving extremities and grossly nonfocal Extremities: No edema, distal peripheral pulses palpable.  Skin: No rashes,no icterus. MSK: Normal muscle bulk,tone, power  no tendernesson back  Labs on Admission: I have personally reviewed following labs and imaging studies  CBC: Recent Labs  Lab 07/04/23 0754  WBC 20.6*  NEUTROABS 17.9*  HGB 13.4  HCT 41.3  MCV 87.9  PLT 173   Basic Metabolic Panel: Recent Labs  Lab 07/04/23 1023  NA 135  K 6.2*  CL 106  CO2 16*  GLUCOSE 232*  BUN 44*  CREATININE 3.17*  CALCIUM 7.3*   Estimated Creatinine Clearance: 22.6 mL/min (A) (by C-G formula based on SCr of 3.17 mg/dL (H)). Recent Labs  Lab 07/04/23 1023  AST 36  ALT 29  ALKPHOS 52  BILITOT 0.5  PROT 5.8*  ALBUMIN 2.9*   Recent Labs  Lab 07/04/23 1023  LIPASE 16   No results for input(s): "AMMONIA" in the last 168 hours. Coagulation Profile: Recent Labs  Lab 07/04/23 0754  INR 1.3*   Cardiac Panel (last 3 results) No results for input(s): "CKTOTAL", "CKMB", "TROPONINIHS", "RELINDX" in the last 72 hours.  BNP (last 3 results) No results for input(s): "PROBNP" in the last 8760 hours. HbA1C: No results for input(s): "HGBA1C" in the last 72 hours. CBG: No results for input(s): "GLUCAP" in the last 168 hours. Lipid  Profile: No results for input(s): "CHOL", "HDL", "LDLCALC", "TRIG", "CHOLHDL", "LDLDIRECT" in the last 72 hours. Thyroid Function Tests: No results for input(s): "TSH", "T4TOTAL", "FREET4", "T3FREE", "THYROIDAB" in the last 72 hours. Urine analysis:    Component Value Date/Time  COLORURINE AMBER (A) 02/11/2016 0003   APPEARANCEUR CLOUDY (A) 02/11/2016 0003   LABSPEC 1.027 02/11/2016 0003   PHURINE 5.5 02/11/2016 0003   GLUCOSEU NEGATIVE 02/11/2016 0003   HGBUR NEGATIVE 02/11/2016 0003   BILIRUBINUR SMALL (A) 02/11/2016 0003   KETONESUR NEGATIVE 02/11/2016 0003   PROTEINUR NEGATIVE 02/11/2016 0003   UROBILINOGEN 0.2 01/08/2015 1700   NITRITE NEGATIVE 02/11/2016 0003   LEUKOCYTESUR MODERATE (A) 02/11/2016 0003    Radiological Exams on Admission: CT CHEST ABDOMEN PELVIS WO CONTRAST Result Date: 07/04/2023 CLINICAL DATA:  Sepsis LLQ pain, SOB, +SIRS, PNA vs intraabd infx EXAM: CT CHEST, ABDOMEN AND PELVIS WITHOUT CONTRAST TECHNIQUE: Multidetector CT imaging of the chest, abdomen and pelvis was performed following the standard protocol without IV contrast. RADIATION DOSE REDUCTION: This exam was performed according to the departmental dose-optimization program which includes automated exposure control, adjustment of the mA and/or kV according to patient size and/or use of iterative reconstruction technique. COMPARISON:  Chest radiograph same date.  Abdominal CTA 03/28/2021. FINDINGS: CT CHEST FINDINGS Cardiovascular: Atherosclerosis of the aorta, great vessels and coronary arteries. The heart size is normal. There is no pericardial effusion. Mediastinum/Nodes: There are no enlarged mediastinal, hilar or axillary lymph nodes. The thyroid gland, trachea and esophagus demonstrate no significant findings. Lungs/Pleura: No pleural effusion or pneumothorax. New dependent atelectasis at both lung bases. No confluent airspace disease or suspicious pulmonary nodularity. Musculoskeletal/Chest wall: There is  an acute burst fracture at T12 resulting in 4 mm of osseous retropulsion and approximately 60% loss of vertebral body height. No other acute fractures are seen in the chest. CT ABDOMEN AND PELVIS FINDINGS Hepatobiliary: The liver demonstrates diffusely decreased density consistent with steatosis. No focal or acute abnormalities are identified on noncontrast imaging. No evidence of gallstones, gallbladder wall thickening or biliary dilatation. Pancreas: Diffuse pancreatic atrophy. No ductal dilatation or surrounding inflammation. Spleen: Normal in size without focal abnormality. Adrenals/Urinary Tract: Both adrenal glands appear normal. No evidence of urinary tract calculus. There is new asymmetric perinephric soft tissue stranding on the left with air in the left intrarenal collecting system and bladder. In addition, there is a small amount of retroperitoneal soft tissue emphysema along the inferior medial aspect of the left kidney. There is no drainable fluid collection. A 3.5 cm cyst in the interpolar region of the right kidney is grossly stable, for which no specific follow-up imaging is recommended. No significant ureteral dilatation. Small amount of air in the bladder lumen. No significant bladder wall thickening or surrounding inflammation. Stomach/Bowel: No enteric contrast administered. The stomach appears unremarkable for its degree of distension. No evidence of bowel wall thickening, distention or surrounding inflammatory change. Vascular/Lymphatic: There are no enlarged abdominal or pelvic lymph nodes. Aortic and branch vessel atherosclerosis without evidence of aneurysm. Severe involvement of the proximal left femoral artery. Reproductive: The prostate gland and seminal vesicles appear unremarkable. Other: Stable small umbilical and bilateral inguinal hernias containing only fat. No ascites or pneumoperitoneum. Musculoskeletal: No acute or significant osseous findings. Specifically, no acute fractures  are identified within the abdomen or pelvis. Lumbar spondylosis noted. IMPRESSION: 1. Acute burst fracture at T12 with 4 mm of osseous retropulsion and approximately 60% loss of vertebral body height. 2. New asymmetric perinephric soft tissue stranding on the left with air in the left intrarenal collecting system and bladder. In addition, there is a small amount of retroperitoneal soft tissue emphysema along the inferior medial aspect of the left kidney. In the absence of recent instrumentation, findings are suspicious for  emphysematous pyelonephritis. No drainable fluid collection. 3. No other acute findings identified in the chest, abdomen or pelvis. 4. Hepatic steatosis. 5.  Aortic Atherosclerosis (ICD10-I70.0). Electronically Signed   By: Carey Bullocks M.D.   On: 07/04/2023 14:18   DG Chest Port 1 View Result Date: 07/04/2023 CLINICAL DATA:  Sepsis EXAM: PORTABLE CHEST 1 VIEW COMPARISON:  Chest radiograph dated 11/19/2009 FINDINGS: Normal lung volumes. Bibasilar patchy and linear opacities, left-greater-than-right. No pleural effusion or pneumothorax. The heart size and mediastinal contours are within normal limits. No acute osseous abnormality. IMPRESSION: Bibasilar patchy and linear opacities, left-greater-than-right, likely atelectasis. Aspiration or pneumonia can be considered in the appropriate clinical setting. Electronically Signed   By: Agustin Cree M.D.   On: 07/04/2023 08:37    Lanae Boast MD [facility]  If 7PM-7AM, please contact night-coverage [email or web address]  07/04/2023, 3:28 PM

## 2023-07-04 NOTE — Progress Notes (Signed)
 Pharmacy Antibiotic Note  Todd Krause is a 73 y.o. male admitted on 07/04/2023 with sepsis.  Pharmacy has been consulted for cefepime dosing.  Patient presented with N/V/D and LLQ abdominal pain. Was found to have elevated WBC to 20.6, LA elevated to 6.1 and Code Sepsis was initiated. He received cefepime 2 grams and metronidazole 500 mg IV x 1 dose in the ED. He has been afebrile while in the ED. Scr elevated to 3.17.  Plan: Cefepime 2 grams IV every 24 hours Metronidazole per MD  Monitor clinical progression, culture results, and ability to tailor antimicrobial therapy   Height: 5\' 8"  (172.7 cm) Weight: 95.3 kg (210 lb) IBW/kg (Calculated) : 68.4  Temp (24hrs), Avg:97.9 F (36.6 C), Min:97.8 F (36.6 C), Max:97.9 F (36.6 C)  Recent Labs  Lab 07/04/23 0754 07/04/23 0834 07/04/23 1018 07/04/23 1023  WBC 20.6*  --   --   --   CREATININE  --   --   --  3.17*  LATICACIDVEN  --  6.1* 3.4*  --     Estimated Creatinine Clearance: 22.6 mL/min (A) (by C-G formula based on SCr of 3.17 mg/dL (H)).    Allergies  Allergen Reactions   Latex Hives   Tramadol Rash   Codeine Other (See Comments)    constipation    Antimicrobials this admission: Cefepime 3/6 >>  Metronidazole 3/6 >>    Microbiology results: 3/6 BCx: NGTD 3/6 RVP: negative  Thank you for allowing pharmacy to be a part of this patient's care.  Lennie Muckle, PharmD PGY1 Pharmacy Resident 07/04/2023 3:42 PM

## 2023-07-04 NOTE — ED Provider Notes (Addendum)
 [place] EMERGENCY DEPARTMENT AT [facility] Provider Note   CSN: [id number] Arrival date & time: 07/04/23  0746     History  Chief Complaint  Patient presents with  . Abdominal Pain    Todd Krause is a 73 y.o. male.  With a history of type 2 diabetes, hypertension, hyperlipidemia and peripheral artery disease on Plavix who presents to the ED for abdominal pain.  Left lower quadrant abdominal pain began early yesterday morning around 0400 along with nausea vomiting diarrhea.  Patient describes the pain as sharp in quality and "feels like someone is pulling" in the left lower quadrant.  For this reason EMS was called this morning and noted the patient to be hypotensive with initial blood pressure systolic in the 70s.  No chest pain shortness of breath fevers chills.  Patient denies prior history of abdominal surgery or diverticulitis   Abdominal Pain      Home Medications Prior to Admission medications   Medication Sig Start Date End Date Taking? Authorizing Provider  albuterol (PROVENTIL) (2.5 MG/3ML) 0.083% nebulizer solution Take 2.5 mg by nebulization every 6 (six) hours as needed for wheezing or shortness of breath.   Yes [provider]  albuterol (VENTOLIN HFA) 108 (90 Base) MCG/ACT inhaler Inhale 2 puffs into the lungs every 4 (four) hours as needed for shortness of breath or wheezing.   Yes [provider]  amLODipine (NORVASC) 10 MG tablet Take 10 mg by mouth at bedtime. 12/02/20  Yes [provider]  aspirin EC 81 MG EC tablet Take 1 tablet (81 mg total) by mouth daily at 6 (six) AM. Swallow whole. Patient taking differently: Take 81 mg by mouth at bedtime. Swallow whole. 05/01/21  Yes Rhyne, Samantha J, PA-C  atorvastatin (LIPITOR) 20 MG tablet Take 20 mg by mouth 3 (three) times a week. In the evening 02/08/20  Yes [provider]  calcium-vitamin D (OSCAL WITH D) 500-5 MG-MCG tablet Take 1 tablet by mouth 3 (three)  times daily. 07/09/23  Yes Elgergawy, Leana Roe, MD  carvedilol (COREG) 12.5 MG tablet TAKE 1 TABLET TWICE DAILY WITH A MEAL (CONTACT OFFICE AND MAKE APPOINTMENT OR GET REFILL FROM PCP) 02/11/23  Yes [name], MD  Cholecalciferol (VITAMIN D3) 2000 UNITS TABS Take 2,000 Units by mouth every evening.   Yes [provider]  clopidogrel (PLAVIX) 75 MG tablet TAKE 1 TABLET EVERY DAY  AT  6AM 02/12/22  Yes Maeola Harman, MD  fenofibrate 54 MG tablet Take 54 mg by mouth daily.   Yes [provider]  finasteride (PROSCAR) 5 MG tablet Take 1 tablet (5 mg total) by mouth daily. Patient taking differently: Take 5 mg by mouth every evening. 01/11/15  Yes Gherghe, Daylene Katayama, MD  HUMALOG MIX 75/25 KWIKPEN (75-25) 100 UNIT/ML KwikPen Inject 15-20 Units into the skin See admin instructions. Inject 20 units into the skin in the morning and 15 units in the evening. 05/23/23  Yes [provider]  metFORMIN (GLUCOPHAGE) 1000 MG tablet Take 1,000 mg by mouth 2 (two) times daily. 08/03/14  Yes [provider]  Multiple Vitamin (MULTIVITAMIN) tablet Take 1 tablet by mouth every evening.    Yes [provider]  omeprazole (PRILOSEC) 40 MG capsule Take 40 mg by mouth 2 (two) times daily.   Yes [provider]  Red Yeast Rice 600 MG CAPS Take 600 mg by mouth every evening.    Yes [provider]  tamsulosin (FLOMAX) 0.4  MG CAPS capsule Take 0.4 mg by mouth 2 (two) times daily. 11/01/20  Yes [provider]  vitamin B-12 (CYANOCOBALAMIN) 1000 MCG tablet Take 1,000 mcg by mouth every evening.    Yes [provider]  acetaminophen (TYLENOL) 325 MG tablet Take 2 tablets (650 mg total) by mouth every 6 (six) hours as needed for mild pain (pain score 1-3) (or Fever >/= 101). 07/09/23   Elgergawy, Leana Roe, MD  hydrochlorothiazide (MICROZIDE) 12.5 MG capsule Take 1 capsule (12.5 mg total) by mouth daily. 07/24/23 10/22/23  Little Ishikawa, MD      Allergies    Latex, Tramadol, and Codeine    Review of Systems   Review of Systems  Gastrointestinal:  Positive for abdominal pain.    Physical Exam Updated Vital Signs BP (!) 139/55 (BP Location: Right Arm)   Pulse 75   Temp 97.7 F (36.5 C) (Oral)   Resp 16   Ht 5\' 8"  (1.727 m)   Wt 95.3 kg   SpO2 94%   BMI 31.93 kg/m  Physical Exam Vitals and nursing note reviewed.  HENT:     Head: Normocephalic and atraumatic.  Eyes:     Pupils: Pupils are equal, round, and reactive to light.  Cardiovascular:     Rate and Rhythm: Normal rate and regular rhythm.  Pulmonary:     Effort: Pulmonary effort is normal.     Breath sounds: Normal breath sounds.  Abdominal:     Palpations: Abdomen is soft.     Tenderness: There is abdominal tenderness in the left lower quadrant. There is no guarding or rebound.  Skin:    General: Skin is warm and dry.  Neurological:     Mental Status: He is alert.  Psychiatric:        Mood and Affect: Mood normal.     ED Results / Procedures / Treatments   Labs (all labs ordered are listed, but only abnormal results are displayed) Labs Reviewed  CULTURE, BLOOD (ROUTINE X 2) - Abnormal; Notable for the following components:      Result Value   Culture   (*)    Value: ESCHERICHIA COLI SUSCEPTIBILITIES PERFORMED ON PREVIOUS CULTURE WITHIN THE LAST 5 DAYS. Performed at [facility], [address], [place], [place] [address]    All other components within normal limits  CULTURE, BLOOD (ROUTINE X 2) - Abnormal; Notable for the following components:   Culture ESCHERICHIA COLI (*)    All other components within normal limits  URINE CULTURE - Abnormal; Notable for the following components:   Culture   (*)    Value: 40,000 COLONIES/mL ENTEROCOCCUS FAECALIS 10,000 COLONIES/mL LACTOBACILLUS SPECIES Standardized susceptibility testing for this organism is not available. Performed at [facility], [address], [place],  [place] [address]    Organism ID, Bacteria ENTEROCOCCUS FAECALIS (*)    All other components within normal limits  BLOOD CULTURE ID PANEL (REFLEXED) - BCID2 - Abnormal; Notable for the following components:   Enterobacterales DETECTED (*)    Escherichia coli DETECTED (*)    All other components within normal limits  CBC WITH DIFFERENTIAL/PLATELET - Abnormal; Notable for the following components:   WBC 20.6 (*)    Neutro Abs 17.9 (*)    Abs Immature Granulocytes 0.82 (*)    All other components within normal limits  PROTIME-INR - Abnormal; Notable for the following components:   Prothrombin Time 16.2 (*)    INR 1.3 (*)  All other components within normal limits  URINALYSIS, W/ REFLEX TO CULTURE (INFECTION SUSPECTED) - Abnormal; Notable for the following components:   APPearance CLOUDY (*)    Glucose, UA >=500 (*)    Hgb urine dipstick LARGE (*)    Ketones, ur 5 (*)    Protein, ur 100 (*)    Leukocytes,Ua MODERATE (*)    Bacteria, UA FEW (*)    All other components within normal limits  COMPREHENSIVE METABOLIC PANEL - Abnormal; Notable for the following components:   Potassium 6.2 (*)    CO2 16 (*)    Glucose, Bld 232 (*)    BUN 44 (*)    Creatinine, Ser 3.17 (*)    Calcium 7.3 (*)    Total Protein 5.8 (*)    Albumin 2.9 (*)    GFR, Estimated 20 (*)    All other components within normal limits  HEMOGLOBIN A1C - Abnormal; Notable for the following components:   Hgb A1c MFr Bld 7.8 (*)    All other components within normal limits  BASIC METABOLIC PANEL - Abnormal; Notable for the following components:   Potassium 5.2 (*)    CO2 17 (*)    Glucose, Bld 197 (*)    BUN 44 (*)    Creatinine, Ser 2.69 (*)    Calcium 8.1 (*)    GFR, Estimated 24 (*)    All other components within normal limits  COMPREHENSIVE METABOLIC PANEL - Abnormal; Notable for the following components:   CO2 19 (*)    Glucose, Bld 135 (*)    BUN 38 (*)    Creatinine, Ser 1.94 (*)    Calcium 8.0 (*)    Total  Protein 6.2 (*)    Albumin 2.9 (*)    GFR, Estimated 35 (*)    All other components within normal limits  CBC - Abnormal; Notable for the following components:   WBC 19.4 (*)    Hemoglobin 12.7 (*)    HCT 38.7 (*)    Platelets 147 (*)    All other components within normal limits  GLUCOSE, CAPILLARY - Abnormal; Notable for the following components:   Glucose-Capillary 175 (*)    All other components within normal limits  GLUCOSE, CAPILLARY - Abnormal; Notable for the following components:   Glucose-Capillary 138 (*)    All other components within normal limits  GLUCOSE, CAPILLARY - Abnormal; Notable for the following components:   Glucose-Capillary 154 (*)    All other components within normal limits  GLUCOSE, CAPILLARY - Abnormal; Notable for the following components:   Glucose-Capillary 138 (*)    All other components within normal limits  GLUCOSE, CAPILLARY - Abnormal; Notable for the following components:   Glucose-Capillary 208 (*)    All other components within normal limits  GLUCOSE, CAPILLARY - Abnormal; Notable for the following components:   Glucose-Capillary 154 (*)    All other components within normal limits  CBC - Abnormal; Notable for the following components:   WBC 19.6 (*)    Hemoglobin 12.3 (*)    HCT 37.8 (*)    Platelets 139 (*)    All other components within normal limits  BASIC METABOLIC PANEL - Abnormal; Notable for the following components:   Glucose, Bld 178 (*)    BUN 34 (*)    Creatinine, Ser 1.68 (*)    Calcium 8.8 (*)    GFR, Estimated 42 (*)    All other components within normal limits  BRAIN NATRIURETIC PEPTIDE -  Abnormal; Notable for the following components:   B Natriuretic Peptide 299.0 (*)    All other components within normal limits  GLUCOSE, CAPILLARY - Abnormal; Notable for the following components:   Glucose-Capillary 174 (*)    All other components within normal limits  GLUCOSE, CAPILLARY - Abnormal; Notable for the following  components:   Glucose-Capillary 151 (*)    All other components within normal limits  GLUCOSE, CAPILLARY - Abnormal; Notable for the following components:   Glucose-Capillary 161 (*)    All other components within normal limits  GLUCOSE, CAPILLARY - Abnormal; Notable for the following components:   Glucose-Capillary 183 (*)    All other components within normal limits  GLUCOSE, CAPILLARY - Abnormal; Notable for the following components:   Glucose-Capillary 172 (*)    All other components within normal limits  GLUCOSE, CAPILLARY - Abnormal; Notable for the following components:   Glucose-Capillary 125 (*)    All other components within normal limits  CBC - Abnormal; Notable for the following components:   WBC 12.1 (*)    Hemoglobin 12.1 (*)    HCT 37.4 (*)    Platelets 119 (*)    All other components within normal limits  BASIC METABOLIC PANEL - Abnormal; Notable for the following components:   Glucose, Bld 127 (*)    BUN 30 (*)    Creatinine, Ser 1.34 (*)    Calcium 8.8 (*)    GFR, Estimated 55 (*)    All other components within normal limits  BRAIN NATRIURETIC PEPTIDE - Abnormal; Notable for the following components:   B Natriuretic Peptide 329.7 (*)    All other components within normal limits  GLUCOSE, CAPILLARY - Abnormal; Notable for the following components:   Glucose-Capillary 190 (*)    All other components within normal limits  GLUCOSE, CAPILLARY - Abnormal; Notable for the following components:   Glucose-Capillary 152 (*)    All other components within normal limits  GLUCOSE, CAPILLARY - Abnormal; Notable for the following components:   Glucose-Capillary 135 (*)    All other components within normal limits  GLUCOSE, CAPILLARY - Abnormal; Notable for the following components:   Glucose-Capillary 168 (*)    All other components within normal limits  GLUCOSE, CAPILLARY - Abnormal; Notable for the following components:   Glucose-Capillary 251 (*)    All other  components within normal limits  GLUCOSE, CAPILLARY - Abnormal; Notable for the following components:   Glucose-Capillary 167 (*)    All other components within normal limits  CBC - Abnormal; Notable for the following components:   Hemoglobin 12.7 (*)    HCT 38.6 (*)    Platelets 145 (*)    All other components within normal limits  BASIC METABOLIC PANEL - Abnormal; Notable for the following components:   Glucose, Bld 150 (*)    BUN 31 (*)    Creatinine, Ser 1.32 (*)    GFR, Estimated 56 (*)    All other components within normal limits  BRAIN NATRIURETIC PEPTIDE - Abnormal; Notable for the following components:   B Natriuretic Peptide 174.9 (*)    All other components within normal limits  GLUCOSE, CAPILLARY - Abnormal; Notable for the following components:   Glucose-Capillary 200 (*)    All other components within normal limits  GLUCOSE, CAPILLARY - Abnormal; Notable for the following components:   Glucose-Capillary 168 (*)    All other components within normal limits  GLUCOSE, CAPILLARY - Abnormal; Notable for the following components:  Glucose-Capillary 187 (*)    All other components within normal limits  GLUCOSE, CAPILLARY - Abnormal; Notable for the following components:   Glucose-Capillary 164 (*)    All other components within normal limits  GLUCOSE, CAPILLARY - Abnormal; Notable for the following components:   Glucose-Capillary 260 (*)    All other components within normal limits  GLUCOSE, CAPILLARY - Abnormal; Notable for the following components:   Glucose-Capillary 283 (*)    All other components within normal limits  CBC - Abnormal; Notable for the following components:   Hemoglobin 12.7 (*)    HCT 37.8 (*)    All other components within normal limits  BASIC METABOLIC PANEL - Abnormal; Notable for the following components:   Sodium 134 (*)    Glucose, Bld 188 (*)    BUN 29 (*)    Creatinine, Ser 1.28 (*)    Calcium 8.0 (*)    GFR, Estimated 58 (*)    All  other components within normal limits  GLUCOSE, CAPILLARY - Abnormal; Notable for the following components:   Glucose-Capillary 292 (*)    All other components within normal limits  GLUCOSE, CAPILLARY - Abnormal; Notable for the following components:   Glucose-Capillary 259 (*)    All other components within normal limits  GLUCOSE, CAPILLARY - Abnormal; Notable for the following components:   Glucose-Capillary 150 (*)    All other components within normal limits  GLUCOSE, CAPILLARY - Abnormal; Notable for the following components:   Glucose-Capillary 183 (*)    All other components within normal limits  GLUCOSE, CAPILLARY - Abnormal; Notable for the following components:   Glucose-Capillary 187 (*)    All other components within normal limits  I-STAT CG4 LACTIC ACID, ED - Abnormal; Notable for the following components:   Lactic Acid, Venous 6.1 (*)    All other components within normal limits  I-STAT CG4 LACTIC ACID, ED - Abnormal; Notable for the following components:   Lactic Acid, Venous 3.4 (*)    All other components within normal limits  CBG MONITORING, ED - Abnormal; Notable for the following components:   Glucose-Capillary 186 (*)    All other components within normal limits  RESP PANEL BY RT-PCR (RSV, FLU A&B, COVID)  RVPGX2  C DIFFICILE QUICK SCREEN W PCR REFLEX    GASTROINTESTINAL PANEL BY PCR, STOOL (REPLACES STOOL CULTURE)  CULTURE, BLOOD (SINGLE)  APTT  LIPASE, BLOOD  PROCALCITONIN  MAGNESIUM  PHOSPHORUS  PROCALCITONIN  PROCALCITONIN    EKG EKG Interpretation Date/Time:  Thursday July 04 2023 07:51:10 EST Ventricular Rate:  102 PR Interval:  188 QRS Duration:  125 QT Interval:  358 QTC Calculation: 467 R Axis:   90  Text Interpretation: Sinus tachycardia Nonspecific intraventricular conduction delay Anterior infarct, old Abnormal T, consider ischemia, lateral leads Confirmed by Estanislado Pandy [phone]) on 07/05/2023 8:29:32 PM  Radiology No results  found.   Procedures .Critical Care  Performed by: Royanne Foots, DO Authorized by: Royanne Foots, DO   Critical care provider statement:    Critical care time (minutes):  30   Critical care was necessary to treat or prevent imminent or life-threatening deterioration of the following conditions:  Sepsis   Critical care was time spent personally by me on the following activities:  Development of treatment plan with patient or surrogate, discussions with consultants, evaluation of patient's response to treatment, examination of patient, ordering and review of laboratory studies, ordering and review of radiographic studies, ordering and performing treatments and  interventions, pulse oximetry, re-evaluation of patient's condition and review of old charts   I assumed direction of critical care for this patient from another provider in my specialty: no     Care discussed with: admitting provider       Medications Ordered in ED Medications  lactated ringers infusion (0 mLs Intravenous Stopped 07/05/23 0853)  sodium chloride 0.9 % bolus 1,000 mL (0 mLs Intravenous Stopped 07/04/23 0833)    And  sodium chloride 0.9 % bolus 1,000 mL (0 mLs Intravenous Stopped 07/04/23 0843)    And  sodium chloride 0.9 % bolus 1,000 mL (0 mLs Intravenous Stopped 07/04/23 1120)  ceFEPIme (MAXIPIME) 2 g in sodium chloride 0.9 % 100 mL IVPB (0 g Intravenous Stopped 07/04/23 0834)  metroNIDAZOLE (FLAGYL) IVPB 500 mg (0 mg Intravenous Stopped 07/04/23 1121)  calcium gluconate 1 g/ 50 mL sodium chloride IVPB (0 mg Intravenous Stopped 07/04/23 1628)  sodium zirconium cyclosilicate (LOKELMA) packet 10 g (10 g Oral Given 07/04/23 1426)  furosemide (LASIX) injection 40 mg (40 mg Intravenous Given 07/05/23 2031)  furosemide (LASIX) injection 40 mg (40 mg Intravenous Given 07/07/23 1544)    ED Course/ Medical Decision Making/ A&P Clinical Course as of 07/26/23 1102  Thu Jul 04, 2023  1330 Reassessment of pt's hemodynamic status  completed. Pt is normotensive at this time [MP]  1440 Laboratory workup notable for leukocytosis with elevation in venous lactate initial venous lactate of 6.1 downtrending to 3.4 and repeat after fluids and antibiotics.  Labs consistent with AKI.  Hyperkalemia of 6.2.  Will provide Lokelma and calcium gluconate. 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  No evidence of pneumonia.  This likely represents the source of his infection.  He has remained hemodynamically stable at this time.  Will admit to medicine  CT also showed compression fracture of T12.  He has not had any recent falls has no pain here and no neurologic deficits. [MP]    Clinical Course User Index [MP] Royanne Foots, DO                                 Medical Decision Making 73 year old male with history as above presenting for 2 days of left lower quadrant abdominal pain nausea vomiting diarrhea.  Noted to be hypotensive tachycardic and tachypneic upon my initial assessment.  Meeting SIRS criteria.  Code sepsis activated.  IV fluid bolus 30 cc/kg initiated along with broad-spectrum antibiotic coverage for suspected intra-abdominal source.  Exam notable for left lower quadrant tenderness.  Clear lung sounds bilaterally.    Differential diagnosis includes: Acute intra-abdominal infectious/inflammatory process such as appendicitis, diverticulitis, pancreatitis and cholecystitis Urinary tract infection Atypical presentation for pneumonia Viral gastroenteritis  Will obtain laboratory workup including CBC with differential, metabolic panel, lipase and urinalysis along with CT abdomen pelvis with IV contrast  Amount and/or Complexity of Data Reviewed Labs: ordered. Radiology: ordered.  Risk Prescription drug management. Decision regarding hospitalization.           Final Clinical Impression(s) / ED Diagnoses Final diagnoses:  Sepsis with acute renal failure, due to unspecified organism,  unspecified acute renal failure type, unspecified whether septic shock present (HCC)  Pyelonephritis  Hyperkalemia    Rx / DC Orders ED Discharge Orders          Ordered    valsartan-hydrochlorothiazide (DIOVAN HCT) 320-25 MG tablet  Daily,   Status:  Discontinued  07/09/23 0920    spironolactone (ALDACTONE) 25 MG tablet  Daily,   Status:  Discontinued       Note to Pharmacy: Patient needs to schedule an appointment for additional refills *Second attempt   07/09/23 0920    acetaminophen (TYLENOL) 325 MG tablet  Every 6 hours PRN        07/09/23 0920    amoxicillin (AMOXIL) 500 MG capsule  Every 8 hours        07/09/23 0920    Increase activity slowly        07/09/23 0920    Diet - low sodium heart healthy        07/09/23 0920    Discharge instructions       Comments: Follow with Primary MD Ladora Daniel, PA-C in 7 days   Get CBC, CMP,  checked  by Primary MD next visit.    Activity: As tolerated with Full fall precautions use walker/cane & assistance as needed   Disposition Home    Diet: Heart Healthy / carb modified   On your next visit with your primary care physician please Get Medicines reviewed and adjusted.   Please request your Prim.MD to go over all Hospital Tests and Procedure/Radiological results at the follow up, please get all Hospital records sent to your Prim MD by signing hospital release before you go home.   If you experience worsening of your admission symptoms, develop shortness of breath, life threatening emergency, suicidal or homicidal thoughts you must seek medical attention immediately by calling 911 or calling your MD immediately  if symptoms less severe.  You Must read complete instructions/literature along with all the possible adverse reactions/side effects for all the Medicines you take and that have been prescribed to you. Take any new Medicines after you have completely understood and accpet all the possible adverse reactions/side  effects.   Do not drive, operating heavy machinery, perform activities at heights, swimming or participation in water activities or provide baby sitting services if your were admitted for syncope or siezures until you have seen by Primary MD or a Neurologist and advised to do so again.  Do not drive when taking Pain medications.    Do not take more than prescribed Pain, Sleep and Anxiety Medications  Special Instructions: If you have smoked or chewed Tobacco  in the last 2 yrs please stop smoking, stop any regular Alcohol  and or any Recreational drug use.  Wear Seat belts while driving.   Please note  You were cared for by a hospitalist during your hospital stay. If you have any questions about your discharge medications or the care you received while you were in the hospital after you are discharged, you can call the unit and asked to speak with the hospitalist on call if the hospitalist that took care of you is not available. Once you are discharged, your primary care physician will handle any further medical issues. Please note that NO REFILLS for any discharge medications will be authorized once you are discharged, as it is imperative that you return to your primary care physician (or establish a relationship with a primary care physician if you do not have one) for your aftercare needs so that they can reassess your need for medications and monitor your lab values.   07/09/23 0920    calcium-vitamin D (OSCAL WITH D) 500-5 MG-MCG tablet  3 times daily        07/09/23 1610  Royanne Foots, DO 07/04/23 1536    Estelle June A, DO 07/26/23 1102

## 2023-07-04 NOTE — ED Notes (Signed)
 Pt flagged lactic acid reults given to erika v rn. By at

## 2023-07-04 NOTE — ED Triage Notes (Signed)
 Patient coming from home. 0400 yesterday started having N/V/D. LLQ abdominal pain. Hypotensive for EMS. Lowest 73 sys

## 2023-07-04 NOTE — Sepsis Progress Note (Signed)
 Elink monitoring for the code sepsis protocol.

## 2023-07-04 NOTE — ED Notes (Signed)
 Pt lactic acid flagged reults given to ericka v.rn by at

## 2023-07-05 ENCOUNTER — Inpatient Hospital Stay (HOSPITAL_COMMUNITY)

## 2023-07-05 DIAGNOSIS — N12 Tubulo-interstitial nephritis, not specified as acute or chronic: Secondary | ICD-10-CM | POA: Diagnosis not present

## 2023-07-05 DIAGNOSIS — N179 Acute kidney failure, unspecified: Secondary | ICD-10-CM | POA: Diagnosis not present

## 2023-07-05 DIAGNOSIS — E875 Hyperkalemia: Secondary | ICD-10-CM

## 2023-07-05 DIAGNOSIS — A419 Sepsis, unspecified organism: Secondary | ICD-10-CM | POA: Diagnosis not present

## 2023-07-05 LAB — CBC
HCT: 38.7 % — ABNORMAL LOW (ref 39.0–52.0)
Hemoglobin: 12.7 g/dL — ABNORMAL LOW (ref 13.0–17.0)
MCH: 28.4 pg (ref 26.0–34.0)
MCHC: 32.8 g/dL (ref 30.0–36.0)
MCV: 86.6 fL (ref 80.0–100.0)
Platelets: 147 10*3/uL — ABNORMAL LOW (ref 150–400)
RBC: 4.47 MIL/uL (ref 4.22–5.81)
RDW: 14.8 % (ref 11.5–15.5)
WBC: 19.4 10*3/uL — ABNORMAL HIGH (ref 4.0–10.5)
nRBC: 0 % (ref 0.0–0.2)

## 2023-07-05 LAB — GASTROINTESTINAL PANEL BY PCR, STOOL (REPLACES STOOL CULTURE)

## 2023-07-05 LAB — COMPREHENSIVE METABOLIC PANEL
ALT: 30 U/L (ref 0–44)
AST: 38 U/L (ref 15–41)
Albumin: 2.9 g/dL — ABNORMAL LOW (ref 3.5–5.0)
Alkaline Phosphatase: 82 U/L (ref 38–126)
Anion gap: 9 (ref 5–15)
BUN: 38 mg/dL — ABNORMAL HIGH (ref 8–23)
CO2: 19 mmol/L — ABNORMAL LOW (ref 22–32)
Calcium: 8 mg/dL — ABNORMAL LOW (ref 8.9–10.3)
Chloride: 110 mmol/L (ref 98–111)
Creatinine, Ser: 1.94 mg/dL — ABNORMAL HIGH (ref 0.61–1.24)
GFR, Estimated: 35 mL/min — ABNORMAL LOW (ref >60)
Glucose, Bld: 135 mg/dL — ABNORMAL HIGH (ref 70–99)
Potassium: 4.9 mmol/L (ref 3.5–5.1)
Sodium: 138 mmol/L (ref 135–145)
Total Bilirubin: 0.5 mg/dL (ref 0.0–1.2)
Total Protein: 6.2 g/dL — ABNORMAL LOW (ref 6.5–8.1)

## 2023-07-05 LAB — GLUCOSE, CAPILLARY
Glucose-Capillary: 154 mg/dL — ABNORMAL HIGH (ref 70–99)
Glucose-Capillary: 138 mg/dL — ABNORMAL HIGH (ref 70–99)
Glucose-Capillary: 208 mg/dL — ABNORMAL HIGH (ref 70–99)
Glucose-Capillary: 154 mg/dL — ABNORMAL HIGH (ref 70–99)
Glucose-Capillary: 174 mg/dL — ABNORMAL HIGH (ref 70–99)

## 2023-07-05 LAB — C DIFFICILE QUICK SCREEN W PCR REFLEX
C Diff antigen: NEGATIVE
C Diff interpretation: NOT DETECTED
C Diff toxin: NEGATIVE

## 2023-07-05 MED ORDER — PANTOPRAZOLE SODIUM 40 MG PO TBEC
40.0000 mg | DELAYED_RELEASE_TABLET | Freq: Every day | ORAL | Status: DC
  Administered 2023-07-05 – 2023-07-08 (×4): 40 mg via ORAL
  Filled 2023-07-05 (×4): qty 1

## 2023-07-05 MED ORDER — CARVEDILOL 12.5 MG PO TABS
12.5000 mg | ORAL_TABLET | Freq: Two times a day (BID) | ORAL | Status: DC
  Administered 2023-07-05 – 2023-07-09 (×8): 12.5 mg via ORAL
  Filled 2023-07-05 (×8): qty 1

## 2023-07-05 MED ORDER — AMLODIPINE BESYLATE 10 MG PO TABS
10.0000 mg | ORAL_TABLET | Freq: Every day | ORAL | Status: DC
  Administered 2023-07-05 – 2023-07-09 (×5): 10 mg via ORAL
  Filled 2023-07-05 (×5): qty 1

## 2023-07-05 MED ORDER — FUROSEMIDE 10 MG/ML IJ SOLN
40.0000 mg | Freq: Once | INTRAMUSCULAR | Status: AC
  Administered 2023-07-05: 40 mg via INTRAVENOUS
  Filled 2023-07-05: qty 4

## 2023-07-05 NOTE — Evaluation (Signed)
 Physical Therapy Evaluation Patient Details Name: Todd Krause MRN: [id number] DOB: [birth year] Today's Date: 07/05/2023  History of Present Illness  73 year old male presents to the ED 3/6 with complaint of lower quadrant abdominal pain. 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 coli bacteremia, AKI  Hyperkalemia, Metabolic acidosis, Acute burst fracture T12 with no trauma per history. PMHx: type 2 diabetes mellitus on insulin, hypertension, hyperlipidemia, peripheral artery disease  Clinical Impression  Pt admitted with above diagnosis. Active and independent at baseline. Reports one fall last November in the yard after tripping on a garden hose. Does not require assistive device to ambulate. Lives with son, has 24/7 assist available if needed. Wife passed several years ago. Pt required supervision for transfer from bed with RW to stabilize. Increased work of breathing, SpO2 94% on 3L. (See orthostatics for details - denies dizziness.) Gait assessment deferred due to imaging team arrival for chest x-ray. Anticipate he will progress well towards functional goals as medical issues resolve. Pt currently with functional limitations due to the deficits listed below (see PT Problem List). Pt will benefit from acute skilled PT to increase their independence and safety with mobility to allow discharge.         07/05/23 1700  Orthostatic Lying   BP- Lying 142/60  Pulse- Lying 98  Orthostatic Sitting  BP- Sitting (!) 152/99  Pulse- Sitting 102  Orthostatic Standing at 0 minutes  BP- Standing at 0 minutes (!) 150/130  Pulse- Standing at 0 minutes 112  Orthostatic Standing at 3 minutes  BP- Standing at 3 minutes 103/56  Pulse- Standing at 3 minutes 118       If plan is discharge home, recommend the following: A little help with walking and/or transfers;A little help with bathing/dressing/bathroom;Assistance with cooking/housework;Assist for transportation;Help with stairs  or ramp for entrance   Can travel by private vehicle        Equipment Recommendations Other (comment) (TBD, unlikely to need but will update as appropriate)  Recommendations for Other Services       Functional Status Assessment Patient has had a recent decline in their functional status and demonstrates the ability to make significant improvements in function in a reasonable and predictable amount of time.     Precautions / Restrictions Precautions Precautions: Fall (*Has T12 burst fx no orders) Recall of Precautions/Restrictions: Intact Restrictions Weight Bearing Restrictions Per Provider Order: No      Mobility  Bed Mobility Overal bed mobility: Needs Assistance, Modified Independent Bed Mobility: Rolling, Sidelying to Sit Rolling: Modified independent (Device/Increase time) Sidelying to sit: Modified independent (Device/Increase time)       General bed mobility comments: Cues for technique, no assist.    Transfers Overall transfer level: Needs assistance Equipment used: Rolling walker (2 wheels) Transfers: Sit to/from Stand Sit to Stand: Supervision           General transfer comment: [place] [employer] Denies need for RW but seemed to appreciate the support upon standing. Cues for technique. Supervision for safety.    Ambulation/Gait               General Gait Details: Deferred due to x-ray arrival for imaging.  Stairs            Wheelchair Mobility     Tilt Bed    Modified Rankin (Stroke Patients Only)       Balance Overall balance assessment: Needs assistance Sitting-balance support: No upper extremity supported, Feet supported Sitting balance-Leahy Scale: Fair  Standing balance support: Bilateral upper extremity supported, Reliant on assistive device for balance Standing balance-Leahy Scale: Poor Standing balance comment: Leaning on RW, likely due to work of breathing. Did not have time to assess without support.                              Pertinent Vitals/Pain Pain Assessment Pain Assessment: Faces Faces Pain Scale: Hurts even more Pain Location: Lt lower back Pain Descriptors / Indicators: Aching Pain Intervention(s): Monitored during session, Repositioned    Home Living Family/patient expects to be discharged to:: Private residence Living Arrangements: Children Available Help at Discharge: Family;Available 24 hours/day Type of Home: Mobile home Home Access: Stairs to enter Entrance Stairs-Rails: Right Entrance Stairs-Number of Steps: 5   Home Layout: One level Home Equipment: Grab bars - tub/shower;Cane - single point;Shower seat (Rails in hallway)      Prior Function Prior Level of Function : Independent/Modified Independent             Mobility Comments: ind, stays busy, yard. One fall last November after tripping in yard. ADLs Comments: Ind     Extremity/Trunk Assessment   Upper Extremity Assessment Upper Extremity Assessment: Defer to OT evaluation    Lower Extremity Assessment Lower Extremity Assessment: Generalized weakness       Communication   Communication Communication: No apparent difficulties    Cognition Arousal: Alert Behavior During Therapy: WFL for tasks assessed/performed   PT - Cognitive impairments: No apparent impairments                         Following commands: Intact       Cueing Cueing Techniques: Verbal cues     General Comments General comments (skin integrity, edema, etc.): See orthostatics tab or clinical impression in PT note.    Exercises     Assessment/Plan    PT Assessment Patient needs continued PT services  PT Problem List Decreased strength;Decreased activity tolerance;Decreased balance;Decreased mobility;Decreased knowledge of use of DME;Decreased knowledge of precautions;Cardiopulmonary status limiting activity;Pain       PT Treatment Interventions DME instruction;Gait training;Functional mobility  training;Therapeutic activities;Stair training;Therapeutic exercise;Balance training;Neuromuscular re-education;Patient/family education    PT Goals (Current goals can be found in the Care Plan section)  Acute Rehab PT Goals Patient Stated Goal: get well return home PT Goal Formulation: With patient Time For Goal Achievement: 07/19/23 Potential to Achieve Goals: Good    Frequency Min 3X/week     Co-evaluation               AM-PAC PT "6 Clicks" Mobility  Outcome Measure Help needed turning from your back to your side while in a flat bed without using bedrails?: None Help needed moving from lying on your back to sitting on the side of a flat bed without using bedrails?: None Help needed moving to and from a bed to a chair (including a wheelchair)?: A Little Help needed standing up from a chair using your arms (e.g., wheelchair or bedside chair)?: A Little Help needed to walk in hospital room?: A Little Help needed climbing 3-5 steps with a railing? : A Little 6 Click Score: 20    End of Session Equipment Utilized During Treatment: Oxygen Activity Tolerance: Patient tolerated treatment well;Other (comment) (Gait assessment limited by arrival of x-ray for imaging) Patient left: in bed;with call bell/phone within reach;with bed alarm set;Other (comment) (xray team) Nurse Communication: Mobility status PT  Visit Diagnosis: Unsteadiness on feet (R26.81);History of falling (Z91.81);Muscle weakness (generalized) (M62.81);Difficulty in walking, not elsewhere classified (R26.2);Pain Pain - part of body:  (back left side)    Time: 0272-5366 PT Time Calculation (min) (ACUTE ONLY): 22 min   Charges:   PT Evaluation $PT Eval Low Complexity: 1 Low   PT General Charges $$ ACUTE PT VISIT: 1 Visit         Kathlyn Sacramento, PT, DPT [facility]  Rehabilitation Services Physical Therapist Office: [phone] Website: [email or web address]   Berton Mount 07/05/2023, 5:32 PM

## 2023-07-05 NOTE — Progress Notes (Signed)
 PHARMACY - PHYSICIAN COMMUNICATION CRITICAL VALUE ALERT - BLOOD CULTURE IDENTIFICATION (BCID)  Todd Krause is an 73 y.o. male who presented to [facility] on 07/04/2023 with a chief complaint of N/V/D and LLQ abdominal pain.  Assessment:  Started on ABX for sepsis and emphysematous pyelonephritis, now growing E.coli in 2 of 4 blood cx bottles.  Name of physician (or Provider) ContactedJeni Salles MD  Current antibiotics: cefepime and metronidazole  Changes to prescribed antibiotics recommended:  Recommendations accepted by provider -- narrow to ceftriaxone.  Results for orders placed or performed during the hospital encounter of 07/04/23  Blood Culture ID Panel (Reflexed) (Collected: 07/04/2023  7:54 AM)  Result Value Ref Range   Enterococcus faecalis NOT DETECTED NOT DETECTED   Enterococcus Faecium NOT DETECTED NOT DETECTED   Listeria monocytogenes NOT DETECTED NOT DETECTED   Staphylococcus species NOT DETECTED NOT DETECTED   Staphylococcus aureus (BCID) NOT DETECTED NOT DETECTED   Staphylococcus epidermidis NOT DETECTED NOT DETECTED   Staphylococcus lugdunensis NOT DETECTED NOT DETECTED   Streptococcus species NOT DETECTED NOT DETECTED   Streptococcus agalactiae NOT DETECTED NOT DETECTED   Streptococcus pneumoniae NOT DETECTED NOT DETECTED   Streptococcus pyogenes NOT DETECTED NOT DETECTED   A.calcoaceticus-baumannii NOT DETECTED NOT DETECTED   Bacteroides fragilis NOT DETECTED NOT DETECTED   Enterobacterales DETECTED (A) NOT DETECTED   Enterobacter cloacae complex NOT DETECTED NOT DETECTED   Escherichia coli DETECTED (A) NOT DETECTED   Klebsiella aerogenes NOT DETECTED NOT DETECTED   Klebsiella oxytoca NOT DETECTED NOT DETECTED   Klebsiella pneumoniae NOT DETECTED NOT DETECTED   Proteus species NOT DETECTED NOT DETECTED   Salmonella species NOT DETECTED NOT DETECTED   Serratia marcescens NOT DETECTED NOT DETECTED   Haemophilus influenzae NOT DETECTED NOT DETECTED    Neisseria meningitidis NOT DETECTED NOT DETECTED   Pseudomonas aeruginosa NOT DETECTED NOT DETECTED   Stenotrophomonas maltophilia NOT DETECTED NOT DETECTED   Candida albicans NOT DETECTED NOT DETECTED   Candida auris NOT DETECTED NOT DETECTED   Candida glabrata NOT DETECTED NOT DETECTED   Candida krusei NOT DETECTED NOT DETECTED   Candida parapsilosis NOT DETECTED NOT DETECTED   Candida tropicalis NOT DETECTED NOT DETECTED   Cryptococcus neoformans/gattii NOT DETECTED NOT DETECTED   CTX-M ESBL NOT DETECTED NOT DETECTED   Carbapenem resistance IMP NOT DETECTED NOT DETECTED   Carbapenem resistance KPC NOT DETECTED NOT DETECTED   Carbapenem resistance NDM NOT DETECTED NOT DETECTED   Carbapenem resist OXA 48 LIKE NOT DETECTED NOT DETECTED   Carbapenem resistance VIM NOT DETECTED NOT DETECTED    Vernard Gambles, PharmD, BCPS  07/05/2023  12:13 AM

## 2023-07-05 NOTE — Progress Notes (Signed)
 PROGRESS NOTE    Todd Krause  ZHY:[id number] DOB: [birth year] DOA: 07/04/2023 PCP: Ladora Daniel, PA-C   Chief Complaint  Patient presents with   Abdominal Pain    Brief Narrative:   73 year old male with past medical history of type 2 diabetes mellitus on insulin 70/30/Jardiance/metformin, hypertension and Aldactone/Diovan HCT Coreg amlodipine, hyperlipidemia, peripheral artery disease presents to the ED with complaint of lower quadrant abdominal pain 07/03/23  morning around 0400 along with nausea vomiting and diarrhea. He denies any recent fall or fracture. He lives home with his son and ambulatory at baseline and does his yard. Patient otherwise denies any chest pain, shortness of breath, fever, chills, headache, focal weakness, numbness tingling, speech difficulties. No bowel movements since am. On arrival EMS found patient to be hypotensive with BP in 70s In the ED: Tachycardic 104 BP 89/47, 91% on room air subsequently on 2 L nasal cannula. Labs showed acute renal failure with hyperkalemia 6.2 creatinine 3.17, lactic acidosis 6.1 leukocytosis 20.6.  Influenza COVID RSV negative. CXR> bibasilar patchy and linear opacities left greater than right likely atelectasis, aspiration or pneumonia can be considered. CT chest abdomen pelvis without contrast> acute burst fracture of T12 with 4 mm osseous retropulsion and approximately 60% loss of vertebral body height, new symmetric perinephric soft tissue stranding on left with a year in the left intrarenal collecting system and bladder, small amount of retroperitoneal soft tissue emphysema, in the absence of recent instrumentation findings suspicious for emphysematous no apparent nephritis.  No drainable fluid collection.  Hepatic steatosis and aortic atherosclerosis in addition.  No other acute finding on chest abdominal pelvis. Patient was given bolus of NS 3 L, Lokelma, cefepime, Flagyl calcium gluconate Nephrology consulted and admission  requested.   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 Coli bacteremia. -Blood culture growing E. coli, follow on susceptibilities -Broad-spectrum antibiotics, narrowed to Rocephin  AKI Hyperkalemia Metabolic acidosis: -Improving with IV fluids -Avoid nephrotoxic medications. -Hyperkalemia resolved with Lokelma   Acute burst fracture T12 : No trauma per history, non tender and no back pain.  Continue pain control.   Type 2 diabetes mellitus w/ hyperglycemia: -A1c is 7.8 -Hold home medication including Jardiance and metformin . -Continue with insulin sliding scale   Hypertension -Home medications including Diovan, Aldactone, hydrochlorothiazide especially in the setting of AKI and sepsis. -Will resume Coreg and amlodipine now blood pressure is elevated and he is with tachycardia  Hyperlipidemia: Hold statins   Peripheral artery disease w/ hx of stent in rt leg 1 year ago: PTA on plavix/asa-resume.   Hepatic steatosis in CT; Follow-up outpatient advised weight loss  Obesity class I Body mass index is 31.93 kg/m.    DVT prophylaxis: Heparin Code Status: (Full) Family Communication: Discussed with patient Disposition:   Status is: Inpatient    Consultants:  none   Subjective:  Still reports flank pain, generalized weakness, afebrile over last 24 hours  Objective: Vitals:   07/04/23 2000 07/04/23 2229 07/04/23 2341 07/05/23 0338  BP: (!) 151/77 (!) 161/65 (!) 141/60 (!) 151/72  Pulse: (!) 106 (!) 109 (!) 108 (!) 108  Resp: (!) 30 (!) 29 (!) 24 [place])  27  Temp:  98.5 F (36.9 C)  98.2 F (36.8 C) 98.5 F (36.9 C)  TempSrc:  Oral Oral Oral  SpO2: 94% 95% 95% 96%  Weight:      Height:        Intake/Output Summary (Last 24 hours) at 07/05/2023 1138 Last data filed at 07/05/2023 1610 Gross per 24 hour  Intake 4047.14 ml  Output --  Net 4047.14 ml   Filed Weights   07/04/23 0748  Weight: 95.3 kg    Examination:  Awake Alert, Oriented X 3, No new F.N deficits, Normal affect Symmetrical Chest wall movement, diminished air entry at the bases RRR,No Gallops,Rubs or new Murmurs, No Parasternal Heave +ve B.Sounds, Abd Soft, left CVA tenderness No Cyanosis, Clubbing or edema, No new Rash or bruise       Data Reviewed: I have personally reviewed following labs and imaging studies  CBC: Recent Labs  Lab 07/04/23 0754 07/05/23 0522  WBC 20.6* 19.4*  NEUTROABS 17.9*  --   HGB 13.4 12.7*  HCT 41.3 38.7*  MCV 87.9 86.6  PLT 173 147*    Basic Metabolic Panel: Recent Labs  Lab 07/04/23 1023 07/04/23 1808 07/05/23 0522  NA 135 137 138  K 6.2* 5.2* 4.9  CL 106 106 110  CO2 16* 17* 19*  GLUCOSE 232* 197* 135*  BUN 44* 44* 38*  CREATININE 3.17* 2.69* 1.94*  CALCIUM 7.3* 8.1* 8.0*    GFR: Estimated Creatinine Clearance: 36.9 mL/min (A) (by C-G formula based on SCr of 1.94 mg/dL (H)).  Liver Function Tests: Recent Labs  Lab 07/04/23 1023 07/05/23 0522  AST 36 38  ALT 29 30  ALKPHOS 52 82  BILITOT 0.5 0.5  PROT 5.8* 6.2*  ALBUMIN 2.9* 2.9*    CBG: Recent Labs  Lab 07/04/23 1633 07/04/23 1947 07/04/23 2342 07/05/23 0337 07/05/23 0824  GLUCAP 186* 175* 138* 154* 138*     Recent Results (from the past 240 hours)  Blood Culture (routine x 2)     Status: None (Preliminary result)   Collection Time: 07/04/23  7:54 AM   Specimen: BLOOD LEFT ARM  Result Value Ref Range Status   Specimen Description BLOOD LEFT ARM  Final   Special Requests   Final    BOTTLES DRAWN AEROBIC AND ANAEROBIC Blood Culture results may not be optimal due to  an inadequate volume of blood received in culture bottles   Culture  Setup Time   Final    GRAM NEGATIVE RODS IN BOTH AEROBIC AND ANAEROBIC BOTTLES CRITICAL VALUE NOTED.  VALUE IS CONSISTENT WITH PREVIOUSLY REPORTED AND CALLED VALUE. Performed at [facility], [address], [place], [place] [address]    Culture GRAM NEGATIVE RODS  Final   Report Status PENDING  Incomplete  Blood Culture (routine x 2)     Status: Abnormal (Preliminary result)   Collection Time: 07/04/23  7:54 AM   Specimen: BLOOD RIGHT ARM  Result Value Ref Range Status   Specimen Description BLOOD RIGHT ARM  Final   Special Requests   Final    BOTTLES DRAWN AEROBIC AND ANAEROBIC Blood Culture results may not be optimal due to an inadequate volume of blood received in culture bottles   Culture  Setup Time   Final    GRAM NEGATIVE RODS IN BOTH AEROBIC AND ANAEROBIC BOTTLES CRITICAL RESULT CALLED TO, READ BACK BY AND VERIFIED WITH: PHARMD V.BRYK [id number] @ 2334 FH    Culture (A)  Final  ESCHERICHIA COLI SUSCEPTIBILITIES TO FOLLOW Performed at [facility], [address], [place], [place] [address]    Report Status PENDING  Incomplete  Blood Culture ID Panel (Reflexed)     Status: Abnormal   Collection Time: 07/04/23  7:54 AM  Result Value Ref Range Status   Enterococcus faecalis NOT DETECTED NOT DETECTED Final   Enterococcus Faecium NOT DETECTED NOT DETECTED Final   Listeria monocytogenes NOT DETECTED NOT DETECTED Final   Staphylococcus species NOT DETECTED NOT DETECTED Final   Staphylococcus aureus (BCID) NOT DETECTED NOT DETECTED Final   Staphylococcus epidermidis NOT DETECTED NOT DETECTED Final   Staphylococcus lugdunensis NOT DETECTED NOT DETECTED Final   Streptococcus species NOT DETECTED NOT DETECTED Final   Streptococcus agalactiae NOT DETECTED NOT DETECTED Final   Streptococcus pneumoniae NOT DETECTED NOT DETECTED Final   Streptococcus pyogenes NOT DETECTED NOT DETECTED Final    A.calcoaceticus-baumannii NOT DETECTED NOT DETECTED Final   Bacteroides fragilis NOT DETECTED NOT DETECTED Final   Enterobacterales DETECTED (A) NOT DETECTED Final    Comment: Enterobacterales represent a large order of gram negative bacteria, not a single organism. CRITICAL RESULT CALLED TO, READ BACK BY AND VERIFIED WITH: PHARMD V.BRYK [id number] @ 2334 FH    Enterobacter cloacae complex NOT DETECTED NOT DETECTED Final   Escherichia coli DETECTED (A) NOT DETECTED Final    Comment: CRITICAL RESULT CALLED TO, READ BACK BY AND VERIFIED WITH: PHARMD V.BRYK [id number] @ 2334 FH    Klebsiella aerogenes NOT DETECTED NOT DETECTED Final   Klebsiella oxytoca NOT DETECTED NOT DETECTED Final   Klebsiella pneumoniae NOT DETECTED NOT DETECTED Final   Proteus species NOT DETECTED NOT DETECTED Final   Salmonella species NOT DETECTED NOT DETECTED Final   Serratia marcescens NOT DETECTED NOT DETECTED Final   Haemophilus influenzae NOT DETECTED NOT DETECTED Final   Neisseria meningitidis NOT DETECTED NOT DETECTED Final   Pseudomonas aeruginosa NOT DETECTED NOT DETECTED Final   Stenotrophomonas maltophilia NOT DETECTED NOT DETECTED Final   Candida albicans NOT DETECTED NOT DETECTED Final   Candida auris NOT DETECTED NOT DETECTED Final   Candida glabrata NOT DETECTED NOT DETECTED Final   Candida krusei NOT DETECTED NOT DETECTED Final   Candida parapsilosis NOT DETECTED NOT DETECTED Final   Candida tropicalis NOT DETECTED NOT DETECTED Final   Cryptococcus neoformans/gattii NOT DETECTED NOT DETECTED Final   CTX-M ESBL NOT DETECTED NOT DETECTED Final   Carbapenem resistance IMP NOT DETECTED NOT DETECTED Final   Carbapenem resistance KPC NOT DETECTED NOT DETECTED Final   Carbapenem resistance NDM NOT DETECTED NOT DETECTED Final   Carbapenem resist OXA 48 LIKE NOT DETECTED NOT DETECTED Final   Carbapenem resistance VIM NOT DETECTED NOT DETECTED Final    Comment: Performed at [facility], [address], [place], [place] [address]  Resp panel by RT-PCR (RSV, Flu A&B, Covid) Anterior Nasal Swab     Status: None   Collection Time: 07/04/23  8:03 AM   Specimen: Anterior Nasal Swab  Result Value Ref Range Status   SARS Coronavirus 2 by RT PCR NEGATIVE NEGATIVE Final   Influenza A by PCR NEGATIVE NEGATIVE Final   Influenza B by PCR NEGATIVE NEGATIVE Final    Comment: (NOTE) The Xpert Xpress SARS-CoV-2/FLU/RSV plus assay is intended as an aid in the diagnosis of influenza from Nasopharyngeal swab specimens and should not be used as a sole basis for treatment. Nasal washings and aspirates are unacceptable for Xpert Xpress SARS-CoV-2/FLU/RSV testing.  Fact  Sheet for Patients: BloggerCourse.com  Fact Sheet for Healthcare Providers: SeriousBroker.it  This test is not yet approved or cleared by the [place] FDA and has been authorized for detection and/or diagnosis of SARS-CoV-2 by FDA under an Emergency Use Authorization (EUA). This EUA will remain in effect (meaning this test can be used) for the duration of the COVID-19 declaration under Section 564(b)(1) of the Act, 21 U.S.C. section 360bbb-3(b)(1), unless the authorization is terminated or revoked.     Resp Syncytial Virus by PCR NEGATIVE NEGATIVE Final    Comment: (NOTE) Fact Sheet for Patients: BloggerCourse.com  Fact Sheet for Healthcare Providers: SeriousBroker.it  This test is not yet approved or cleared by the [place] FDA and has been authorized for detection and/or diagnosis of SARS-CoV-2 by FDA under an Emergency Use Authorization (EUA). This EUA will remain in effect (meaning this test can be used) for the duration of the COVID-19 declaration under Section 564(b)(1) of the Act, 21 U.S.C. section 360bbb-3(b)(1), unless the authorization is terminated or revoked.  Performed at [facility], [address], [place], [place] [address]          Radiology Studies: CT CHEST ABDOMEN PELVIS WO CONTRAST Result Date: 07/04/2023 CLINICAL DATA:  Sepsis LLQ pain, SOB, +SIRS, PNA vs intraabd infx EXAM: CT CHEST, ABDOMEN AND PELVIS WITHOUT CONTRAST TECHNIQUE: Multidetector CT imaging of the chest, abdomen and pelvis was performed following the standard protocol without IV contrast. RADIATION DOSE REDUCTION: This exam was performed according to the departmental dose-optimization program which includes automated exposure control, adjustment of the mA and/or kV according to patient size and/or use of iterative reconstruction technique. COMPARISON:  Chest radiograph same date.  Abdominal CTA 03/28/2021. FINDINGS: CT CHEST FINDINGS Cardiovascular: Atherosclerosis of the aorta, great vessels and coronary arteries. The heart size is normal. There is no pericardial effusion. Mediastinum/Nodes: There are no enlarged mediastinal, hilar or axillary lymph nodes. The thyroid gland, trachea and esophagus demonstrate no significant findings. Lungs/Pleura: No pleural effusion or pneumothorax. New dependent atelectasis at both lung bases. No confluent airspace disease or suspicious pulmonary nodularity. Musculoskeletal/Chest wall: There is an acute burst fracture at T12 resulting in 4 mm of osseous retropulsion and approximately 60% loss of vertebral body height. No other acute fractures are seen in the chest. CT ABDOMEN AND PELVIS FINDINGS Hepatobiliary: The liver demonstrates diffusely decreased density consistent with steatosis. No focal or acute abnormalities are identified on noncontrast imaging. No evidence of gallstones, gallbladder wall thickening or biliary dilatation. Pancreas: Diffuse pancreatic atrophy. No ductal dilatation or surrounding inflammation. Spleen: Normal in size without focal abnormality. Adrenals/Urinary Tract: Both adrenal glands appear normal. No evidence of urinary tract calculus. There is new asymmetric  perinephric soft tissue stranding on the left with air in the left intrarenal collecting system and bladder. In addition, there is a small amount of retroperitoneal soft tissue emphysema along the inferior medial aspect of the left kidney. There is no drainable fluid collection. A 3.5 cm cyst in the interpolar region of the right kidney is grossly stable, for which no specific follow-up imaging is recommended. No significant ureteral dilatation. Small amount of air in the bladder lumen. No significant bladder wall thickening or surrounding inflammation. Stomach/Bowel: No enteric contrast administered. The stomach appears unremarkable for its degree of distension. No evidence of bowel wall thickening, distention or surrounding inflammatory change. Vascular/Lymphatic: There are no enlarged abdominal or pelvic lymph nodes. Aortic and branch vessel atherosclerosis without evidence of aneurysm. Severe involvement of the proximal  left femoral artery. Reproductive: The prostate gland and seminal vesicles appear unremarkable. Other: Stable small umbilical and bilateral inguinal hernias containing only fat. No ascites or pneumoperitoneum. Musculoskeletal: No acute or significant osseous findings. Specifically, no acute fractures are identified within the abdomen or pelvis. Lumbar spondylosis noted. IMPRESSION: 1. Acute burst fracture at T12 with 4 mm of osseous retropulsion and approximately 60% loss of vertebral body height. 2. New asymmetric perinephric soft tissue stranding on the left with air in the left intrarenal collecting system and bladder. In addition, there is a small amount of retroperitoneal soft tissue emphysema along the inferior medial aspect of the left kidney. In the absence of recent instrumentation, findings are suspicious for emphysematous pyelonephritis. No drainable fluid collection. 3. No other acute findings identified in the chest, abdomen or pelvis. 4. Hepatic steatosis. 5.  Aortic Atherosclerosis  (ICD10-I70.0). Electronically Signed   By: Carey Bullocks M.D.   On: 07/04/2023 14:18   DG Chest Port 1 View Result Date: 07/04/2023 CLINICAL DATA:  Sepsis EXAM: PORTABLE CHEST 1 VIEW COMPARISON:  Chest radiograph dated 11/19/2009 FINDINGS: Normal lung volumes. Bibasilar patchy and linear opacities, left-greater-than-right. No pleural effusion or pneumothorax. The heart size and mediastinal contours are within normal limits. No acute osseous abnormality. IMPRESSION: Bibasilar patchy and linear opacities, left-greater-than-right, likely atelectasis. Aspiration or pneumonia can be considered in the appropriate clinical setting. Electronically Signed   By: Agustin Cree M.D.   On: 07/04/2023 08:37        Scheduled Meds:  aspirin EC  81 mg Oral Daily   clopidogrel  75 mg Oral Daily   finasteride  5 mg Oral QPM   heparin  5,000 Units Subcutaneous Q8H   insulin aspart  0-9 Units Subcutaneous Q4H   pantoprazole (PROTONIX) IV  40 mg Intravenous Q24H   tamsulosin  0.4 mg Oral BID   Continuous Infusions:  cefTRIAXone (ROCEPHIN)  IV 2 g (07/05/23 0912)     LOS: 1 day       Huey Bienenstock, MD [facility]   To contact the attending provider between 7A-7P or the covering provider during after hours 7P-7A, please log into the web site [email or web address] and access using universal [place] password for that web site. If you do not have the password, please call the hospital operator.  07/05/2023, 11:38 AM

## 2023-07-05 NOTE — Progress Notes (Signed)
   07/04/23 1949  Provider Notification  Provider Name/Title Dr. Perrin Maltese, MD  Date Provider Notified 07/04/23  Time Provider Notified 1954  Method of Notification Page  Notification Reason Requested by patient/family  Provider response Evaluate remotely;See new orders  Date of Provider Response 07/04/23  Time of Provider Response 2000   Pt requested Melatonin which he usually takes every night. Dr. Joneen Roach notified.   Pt is alert, oriented x 4, afebrile, hemodynamically stable, EKG shows sinus tachycardia on the monitor, HR 104-115 at rest, BP stable. His MEWS score is in yellow due to tachypnea, SOB with exertions, RR 25-28 at rest, on 4LPM of O2 NCL. No productive cough. SPO2 95-97 %.   Pt also had  6 times of watery diarrhea overnight. Pt stated he has had diarrhea for 2 day prior this admission.  Denied abdominal pain, nausea or vomiting. No black tarry or bloody stools. He had a complaint about light headache and relieved by Tylenol. We will hand off tot he day shift team. Continue to monitor.   Ozzie Hoyle, RN

## 2023-07-05 NOTE — TOC Initial Note (Signed)
 Transition of Care [facility]) - Initial/Assessment Note    Patient Details  Name: Todd Krause MRN: [id number] Date of Birth: 1950/10/16  [facility]) CM/SW Contact:    Lamonte Sakai, Student-Social Work Phone Number: 07/05/2023, 3:58 PM  Clinical Narrative:                  Pt admitted from home with son. No current TOC needs. Please consult as needs arise.     Patient Goals and CMS Choice            Expected Discharge Plan and Services       Living arrangements for the past 2 months: Mobile Home                                      Prior Living Arrangements/Services Living arrangements for the past 2 months: Mobile Home                     Activities of Daily Living   ADL Screening (condition at time of admission) Independently performs ADLs?: Yes (appropriate for developmental age) Is the patient deaf or have difficulty hearing?: No Does the patient have difficulty seeing, even when wearing glasses/contacts?: No Does the patient have difficulty concentrating, remembering, or making decisions?: No  Permission Sought/Granted                  Emotional Assessment       Orientation: : Oriented to Self, Oriented to Place, Oriented to  Time, Oriented to Situation   Psych Involvement: No (comment)  Admission diagnosis:  Hyperkalemia [E87.5] Pyelonephritis [N12] Acute renal failure (HCC) [N17.9] Sepsis with acute renal failure, due to unspecified organism, unspecified acute renal failure type, unspecified whether septic shock present (HCC) [A41.9, R65.20, N17.9] Patient Active Problem List   Diagnosis Date Noted   Acute renal failure (HCC) 07/04/2023   LBBB (left bundle branch block) 01/17/2022   PAD (peripheral artery disease) (HCC) 04/28/2021   Peripheral arterial disease (HCC) 01/27/2021   History of tobacco use 11/21/2016   Sepsis, unspecified organism (HCC) 02/11/2016   Elevated serum creatinine 02/11/2016    Epididymitis 02/11/2016   Sepsis (HCC) 02/11/2016   Acute kidney injury (HCC)    Benign prostatic hyperplasia 05/09/2015   Type 2 diabetes mellitus without complication, with long-term current use of insulin (HCC) 01/08/2015   Hyponatremia 01/08/2015   Elevated PSA 08/25/2014   HYPERCHOLESTEROLEMIA 02/03/2009   Essential hypertension 08/26/2007   AODM 03/10/2007   ERECTILE DYSFUNCTION 01/24/2007   Osteoarthritis 01/24/2007   PCP:  Ladora Daniel, PA-C Pharmacy:   [id number] [place], [place] - [address] AT [facility] [address] [place] [address] Phone: [phone] Fax: [phone]  [facility] - [place], [place] - [address] [place] [address] Phone: [phone] Fax: [phone]     Social Drivers of Health (SDOH) Social History: SDOH Screenings   Food Insecurity: Food Insecurity Present (07/04/2023)  Housing: Low Risk  (07/04/2023)  Transportation Needs: No Transportation Needs (07/04/2023)  Utilities: Not At Risk (07/04/2023)  Financial Resource Strain: High Risk (05/03/2023)   Received from [facility]  Physical Activity: Insufficiently Active (05/03/2023)   Received from [facility]  Social Connections: Socially Isolated (07/04/2023)  Stress: No Stress Concern Present (05/03/2023)   Received from [facility]  Tobacco Use:  Medium Risk (07/04/2023)   SDOH Interventions:     Readmission Risk Interventions     No data to display

## 2023-07-06 DIAGNOSIS — A419 Sepsis, unspecified organism: Secondary | ICD-10-CM | POA: Diagnosis not present

## 2023-07-06 DIAGNOSIS — N179 Acute kidney failure, unspecified: Secondary | ICD-10-CM | POA: Diagnosis not present

## 2023-07-06 DIAGNOSIS — R6520 Severe sepsis without septic shock: Secondary | ICD-10-CM | POA: Diagnosis not present

## 2023-07-06 LAB — GLUCOSE, CAPILLARY
Glucose-Capillary: 125 mg/dL — ABNORMAL HIGH (ref 70–99)
Glucose-Capillary: 151 mg/dL — ABNORMAL HIGH (ref 70–99)
Glucose-Capillary: 152 mg/dL — ABNORMAL HIGH (ref 70–99)
Glucose-Capillary: 161 mg/dL — ABNORMAL HIGH (ref 70–99)
Glucose-Capillary: 172 mg/dL — ABNORMAL HIGH (ref 70–99)
Glucose-Capillary: 183 mg/dL — ABNORMAL HIGH (ref 70–99)
Glucose-Capillary: 190 mg/dL — ABNORMAL HIGH (ref 70–99)

## 2023-07-06 LAB — CULTURE, BLOOD (ROUTINE X 2)

## 2023-07-06 LAB — PHOSPHORUS: Phosphorus: 2.7 mg/dL (ref 2.5–4.6)

## 2023-07-06 LAB — BASIC METABOLIC PANEL
Anion gap: 10 (ref 5–15)
BUN: 34 mg/dL — ABNORMAL HIGH (ref 8–23)
CO2: 23 mmol/L (ref 22–32)
Calcium: 8.8 mg/dL — ABNORMAL LOW (ref 8.9–10.3)
Chloride: 105 mmol/L (ref 98–111)
Creatinine, Ser: 1.68 mg/dL — ABNORMAL HIGH (ref 0.61–1.24)
GFR, Estimated: 42 mL/min — ABNORMAL LOW (ref >60)
Glucose, Bld: 178 mg/dL — ABNORMAL HIGH (ref 70–99)
Potassium: 4.3 mmol/L (ref 3.5–5.1)
Sodium: 138 mmol/L (ref 135–145)

## 2023-07-06 LAB — CBC
HCT: 37.8 % — ABNORMAL LOW (ref 39.0–52.0)
Hemoglobin: 12.3 g/dL — ABNORMAL LOW (ref 13.0–17.0)
MCH: 27.8 pg (ref 26.0–34.0)
MCHC: 32.5 g/dL (ref 30.0–36.0)
MCV: 85.5 fL (ref 80.0–100.0)
Platelets: 139 10*3/uL — ABNORMAL LOW (ref 150–400)
RBC: 4.42 MIL/uL (ref 4.22–5.81)
RDW: 15 % (ref 11.5–15.5)
WBC: 19.6 10*3/uL — ABNORMAL HIGH (ref 4.0–10.5)
nRBC: 0 % (ref 0.0–0.2)

## 2023-07-06 LAB — BRAIN NATRIURETIC PEPTIDE: B Natriuretic Peptide: 299 pg/mL — ABNORMAL HIGH (ref 0.0–100.0)

## 2023-07-06 LAB — MAGNESIUM: Magnesium: 1.7 mg/dL (ref 1.7–2.4)

## 2023-07-06 LAB — PROCALCITONIN: Procalcitonin: 61.86 ng/mL

## 2023-07-06 NOTE — Progress Notes (Addendum)
 PROGRESS NOTE    Todd Krause  ZOX:[id number] DOB: 04/22/1951 DOA: 07/04/2023 PCP: Ladora Daniel, PA-C   Chief Complaint  Patient presents with   Abdominal Pain    Brief Narrative:   73 year old male with past medical history of type 2 diabetes mellitus on insulin 70/30/Jardiance/metformin, hypertension and Aldactone/Diovan HCT Coreg amlodipine, hyperlipidemia, peripheral artery disease presents to the ED with complaint of lower quadrant abdominal pain 07/03/23  morning around 0400 along with nausea vomiting and diarrhea.  -Patient workup significant for severe sepsis, secondary to pyelonephritis, he has E. 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 Coli bacteremia. -Blood culture growing E. coli, pansensitive, continue with IV Rocephin, will repeat blood cultures today -Broad-spectrum antibiotics, narrowed to Rocephin -He remains with significant leukocytosis, BNP significantly elevated at 61  Acute respiratory failure with hypoxia Bilateral atelectasis -Patient requiring up to 5 L oxygen, he drops to the lower 80s on room air, tachypneic, significant Rales and rhonchi at the bases, CT chest on admission significant for significant atelectasis bilaterally -Was encouraged to use incentive spirometry, and to ambulate -Will keep on scheduled DuoNebs and as needed albuterol  AKI Hyperkalemia Metabolic acidosis: -Improving with IV fluids -Avoid nephrotoxic medications. -Hyperkalemia resolved with Lokelma   Acute burst fracture T12 : No trauma  per history, non tender and no back pain.  Continue pain control.   Type 2 diabetes mellitus w/ hyperglycemia: -A1c is 7.8 -Hold home medication including Jardiance and metformin . -Continue with insulin sliding scale  Diarrhea -Improving, negative C. difficile and GI panel, will DC isolation   Hypertension -Home medications including Diovan, Aldactone, hydrochlorothiazide especially in the setting of AKI and sepsis. - resume Coreg and amlodipine now blood pressure is elevated and he is with tachycardia  Hyperlipidemia: Hold statins   Peripheral artery disease w/ hx of stent in rt leg 1 year ago: PTA on plavix/asa-resume.   Hepatic steatosis in CT; Follow-up outpatient advised weight loss  Obesity class I Body mass index is 31.93 kg/m.    DVT prophylaxis: Heparin Code Status: (Full) Family Communication: Discussed with patient Disposition:   Status is: Inpatient    Consultants:  none   Subjective:  Nia yesterday with increased work of breathing, this improved with the present treatment, still reports left flank pain, weakness  Objective: Vitals:   07/06/23 0718 07/06/23 0800 07/06/23 0853 07/06/23 0855  BP: (!) 153/68 (!) 151/66 (!) 151/66 (!) 155/66  Pulse: 96 87 (!) 103 (!) 106  Resp: 20 (!) 21  (!) 22  Temp: 98.2 F (36.8 C)     TempSrc: Oral Oral    SpO2: 94%   95%  Weight:      Height:        Intake/Output Summary (Last 24 hours) at 07/06/2023 1154 Last data filed at 07/06/2023 0855 Gross per 24 hour  Intake 240 ml  Output --  Net 240 ml   Filed Weights   07/04/23 0748  Weight: 95.3 kg    Examination:  Awake Alert, Oriented X 3, frail Symmetrical Chest wall movement, the patient at the bases with Rales and rhonchi  RRR,No Gallops,Rubs or new Murmurs, No Parasternal Heave +ve B.Sounds, Abd Soft, left CVA tenderness No Cyanosis, Clubbing or edema, No new Rash or bruise        Data Reviewed: I have personally reviewed following labs and  imaging studies  CBC: Recent Labs  Lab 07/04/23 0754 07/05/23 0522 07/06/23 0527  WBC 20.6* 19.4* 19.6*  NEUTROABS 17.9*  --   --   HGB 13.4 12.7* 12.3*  HCT 41.3 38.7* 37.8*  MCV 87.9 86.6 85.5  PLT 173 147* 139*    Basic Metabolic Panel: Recent Labs  Lab 07/04/23 1023 07/04/23 1808 07/05/23 0522 07/06/23 0527  NA 135 137 138 138  K 6.2* 5.2* 4.9 4.3  CL 106 106 110 105  CO2 16* 17* 19* 23  GLUCOSE 232* 197* 135* 178*  BUN 44* 44* 38* 34*  CREATININE 3.17* 2.69* 1.94* 1.68*  CALCIUM 7.3* 8.1* 8.0* 8.8*  MG  --   --   --  1.7  PHOS  --   --   --  2.7    GFR: Estimated Creatinine Clearance: 42.6 mL/min (A) (by C-G formula based on SCr of 1.68 mg/dL (H)).  Liver Function Tests: Recent Labs  Lab 07/04/23 1023 07/05/23 0522  AST 36 38  ALT 29 30  ALKPHOS 52 82  BILITOT 0.5 0.5  PROT 5.8* 6.2*  ALBUMIN 2.9* 2.9*    CBG: Recent Labs  Lab 07/05/23 1629 07/05/23 2024 07/06/23 0054 07/06/23 0338 07/06/23 0716  GLUCAP 154* 174* 151* 161* 183*     Recent Results (from the past 240 hours)  Blood Culture (routine x 2)     Status: Abnormal (Preliminary result)   Collection Time: 07/04/23  7:54 AM   Specimen: BLOOD LEFT ARM  Result Value Ref Range Status   Specimen Description BLOOD LEFT ARM  Final   Special Requests   Final    BOTTLES DRAWN AEROBIC AND ANAEROBIC Blood Culture results may not be optimal due to an inadequate volume of blood received in culture bottles   Culture  Setup Time   Final    GRAM NEGATIVE RODS IN BOTH AEROBIC AND ANAEROBIC BOTTLES CRITICAL VALUE NOTED.  VALUE IS CONSISTENT WITH PREVIOUSLY REPORTED AND CALLED VALUE.    Culture (A)  Final    ESCHERICHIA COLI SUSCEPTIBILITIES PERFORMED ON PREVIOUS CULTURE WITHIN THE LAST 5 DAYS. Performed at [facility], [address], [place], [place] [address]    Report Status PENDING  Incomplete  Blood Culture (routine x 2)     Status: Abnormal   Collection Time: 07/04/23  7:54 AM    Specimen: BLOOD RIGHT ARM  Result Value Ref Range Status   Specimen Description BLOOD RIGHT ARM  Final   Special Requests   Final    BOTTLES DRAWN AEROBIC AND ANAEROBIC Blood Culture results may not be optimal due to an inadequate volume of blood received in culture bottles   Culture  Setup Time   Final    GRAM NEGATIVE RODS IN BOTH AEROBIC AND ANAEROBIC BOTTLES CRITICAL RESULT CALLED TO, READ BACK BY AND VERIFIED WITH: Mercer Pod [id number] @ 2334 FH Performed at [facility], [address] [place], [place] [address]    Culture ESCHERICHIA COLI (A)  Final   Report Status 07/06/2023 FINAL  Final   Organism ID, Bacteria ESCHERICHIA COLI  Final   Organism ID, Bacteria ESCHERICHIA COLI  Final      Susceptibility   Escherichia coli - KIRBY BAUER*  CEFAZOLIN SENSITIVE Sensitive    Escherichia coli - MIC*    AMPICILLIN 8 SENSITIVE Sensitive     CEFEPIME <=0.12 SENSITIVE Sensitive     CEFTAZIDIME <=1 SENSITIVE Sensitive     CEFTRIAXONE <=0.25 SENSITIVE Sensitive     CIPROFLOXACIN <=0.25 SENSITIVE Sensitive     GENTAMICIN <=1 SENSITIVE Sensitive     IMIPENEM <=0.25 SENSITIVE Sensitive     TRIMETH/SULFA <=20 SENSITIVE Sensitive     AMPICILLIN/SULBACTAM <=2 SENSITIVE Sensitive     PIP/TAZO <=4 SENSITIVE Sensitive ug/mL    * ESCHERICHIA COLI    ESCHERICHIA COLI  Blood Culture ID Panel (Reflexed)     Status: Abnormal   Collection Time: 07/04/23  7:54 AM  Result Value Ref Range Status   Enterococcus faecalis NOT DETECTED NOT DETECTED Final   Enterococcus Faecium NOT DETECTED NOT DETECTED Final   Listeria monocytogenes NOT DETECTED NOT DETECTED Final   Staphylococcus species NOT DETECTED NOT DETECTED Final   Staphylococcus aureus (BCID) NOT DETECTED NOT DETECTED Final   Staphylococcus epidermidis NOT DETECTED NOT DETECTED Final   Staphylococcus lugdunensis NOT DETECTED NOT DETECTED Final   Streptococcus species NOT DETECTED NOT DETECTED Final   Streptococcus agalactiae NOT  DETECTED NOT DETECTED Final   Streptococcus pneumoniae NOT DETECTED NOT DETECTED Final   Streptococcus pyogenes NOT DETECTED NOT DETECTED Final   A.calcoaceticus-baumannii NOT DETECTED NOT DETECTED Final   Bacteroides fragilis NOT DETECTED NOT DETECTED Final   Enterobacterales DETECTED (A) NOT DETECTED Final    Comment: Enterobacterales represent a large order of gram negative bacteria, not a single organism. CRITICAL RESULT CALLED TO, READ BACK BY AND VERIFIED WITH: PHARMD V.BRYK [id number] @ 2334 FH    Enterobacter cloacae complex NOT DETECTED NOT DETECTED Final   Escherichia coli DETECTED (A) NOT DETECTED Final    Comment: CRITICAL RESULT CALLED TO, READ BACK BY AND VERIFIED WITH: PHARMD V.BRYK [id number] @ 2334 FH    Klebsiella aerogenes NOT DETECTED NOT DETECTED Final   Klebsiella oxytoca NOT DETECTED NOT DETECTED Final   Klebsiella pneumoniae NOT DETECTED NOT DETECTED Final   Proteus species NOT DETECTED NOT DETECTED Final   Salmonella species NOT DETECTED NOT DETECTED Final   Serratia marcescens NOT DETECTED NOT DETECTED Final   Haemophilus influenzae NOT DETECTED NOT DETECTED Final   Neisseria meningitidis NOT DETECTED NOT DETECTED Final   Pseudomonas aeruginosa NOT DETECTED NOT DETECTED Final   Stenotrophomonas maltophilia NOT DETECTED NOT DETECTED Final   Candida albicans NOT DETECTED NOT DETECTED Final   Candida auris NOT DETECTED NOT DETECTED Final   Candida glabrata NOT DETECTED NOT DETECTED Final   Candida krusei NOT DETECTED NOT DETECTED Final   Candida parapsilosis NOT DETECTED NOT DETECTED Final   Candida tropicalis NOT DETECTED NOT DETECTED Final   Cryptococcus neoformans/gattii NOT DETECTED NOT DETECTED Final   CTX-M ESBL NOT DETECTED NOT DETECTED Final   Carbapenem resistance IMP NOT DETECTED NOT DETECTED Final   Carbapenem resistance KPC NOT DETECTED NOT DETECTED Final   Carbapenem resistance NDM NOT DETECTED NOT DETECTED Final   Carbapenem resist OXA 48 LIKE NOT  DETECTED NOT DETECTED Final   Carbapenem resistance VIM NOT DETECTED NOT DETECTED Final    Comment: Performed at [facility], [address] [place], [place] [address]  Resp panel by RT-PCR (RSV, Flu A&B, Covid) Anterior Nasal Swab     Status: None   Collection Time: 07/04/23  8:03 AM   Specimen: Anterior Nasal Swab  Result Value Ref Range Status   SARS  Coronavirus 2 by RT PCR NEGATIVE NEGATIVE Final   Influenza A by PCR NEGATIVE NEGATIVE Final   Influenza B by PCR NEGATIVE NEGATIVE Final    Comment: (NOTE) The Xpert Xpress SARS-CoV-2/FLU/RSV plus assay is intended as an aid in the diagnosis of influenza from Nasopharyngeal swab specimens and should not be used as a sole basis for treatment. Nasal washings and aspirates are unacceptable for Xpert Xpress SARS-CoV-2/FLU/RSV testing.  Fact Sheet for Patients: BloggerCourse.com  Fact Sheet for Healthcare Providers: SeriousBroker.it  This test is not yet approved or cleared by the [place] FDA and has been authorized for detection and/or diagnosis of SARS-CoV-2 by FDA under an Emergency Use Authorization (EUA). This EUA will remain in effect (meaning this test can be used) for the duration of the COVID-19 declaration under Section 564(b)(1) of the Act, 21 U.S.C. section 360bbb-3(b)(1), unless the authorization is terminated or revoked.     Resp Syncytial Virus by PCR NEGATIVE NEGATIVE Final    Comment: (NOTE) Fact Sheet for Patients: BloggerCourse.com  Fact Sheet for Healthcare Providers: SeriousBroker.it  This test is not yet approved or cleared by the [place] FDA and has been authorized for detection and/or diagnosis of SARS-CoV-2 by FDA under an Emergency Use Authorization (EUA). This EUA will remain in effect (meaning this test can be used) for the duration of the COVID-19 declaration under Section 564(b)(1)  of the Act, 21 U.S.C. section 360bbb-3(b)(1), unless the authorization is terminated or revoked.  Performed at [facility], [address] [place], [place] [address]   Urine Culture     Status: None (Preliminary result)   Collection Time: 07/04/23  2:48 PM   Specimen: Urine, Random  Result Value Ref Range Status   Specimen Description URINE, RANDOM  Final   Special Requests NONE Reflexed from [id number]  Final   Culture   Final    CULTURE REINCUBATED FOR BETTER GROWTH Performed at [facility], [address] [place], [place] [address]    Report Status PENDING  Incomplete  C Difficile Quick Screen w PCR reflex     Status: None   Collection Time: 07/05/23  7:42 AM   Specimen: STOOL  Result Value Ref Range Status   C Diff antigen NEGATIVE NEGATIVE Final   C Diff toxin NEGATIVE NEGATIVE Final   C Diff interpretation No C. difficile detected.  Final    Comment: Performed at [facility], [address], [place], [place] [address]  Gastrointestinal Panel by PCR , Stool     Status: None   Collection Time: 07/05/23 12:11 PM   Specimen: Stool  Result Value Ref Range Status   Campylobacter species NOT DETECTED NOT DETECTED Final   Plesimonas shigelloides NOT DETECTED NOT DETECTED Final   Salmonella species NOT DETECTED NOT DETECTED Final   Yersinia enterocolitica NOT DETECTED NOT DETECTED Final   Vibrio species NOT DETECTED NOT DETECTED Final   Vibrio cholerae NOT DETECTED NOT DETECTED Final   Enteroaggregative E coli (EAEC) NOT DETECTED NOT DETECTED Final   Enteropathogenic E coli (EPEC) NOT DETECTED NOT DETECTED Final   Enterotoxigenic E coli (ETEC) NOT DETECTED NOT DETECTED Final   Shiga like toxin producing E coli (STEC) NOT DETECTED NOT DETECTED Final   Shigella/Enteroinvasive E coli (EIEC) NOT DETECTED NOT DETECTED Final   Cryptosporidium NOT DETECTED NOT DETECTED Final   Cyclospora cayetanensis NOT DETECTED NOT DETECTED Final   Entamoeba histolytica NOT  DETECTED NOT DETECTED Final   Giardia lamblia NOT  DETECTED NOT DETECTED Final   Adenovirus F40/41 NOT DETECTED NOT DETECTED Final   Astrovirus NOT DETECTED NOT DETECTED Final   Norovirus GI/GII NOT DETECTED NOT DETECTED Final   Rotavirus A NOT DETECTED NOT DETECTED Final   Sapovirus (I, II, IV, and V) NOT DETECTED NOT DETECTED Final    Comment: Performed at [facility], [address], [place], [place] [address]         Radiology Studies: DG Chest Port 1 View Result Date: 07/05/2023 CLINICAL DATA:  Hypoxia EXAM: PORTABLE CHEST 1 VIEW COMPARISON:  Chest x-ray 07/04/2023 FINDINGS: The heart size and mediastinal contours are within normal limits. There are linear bands of atelectasis in the left lung base. There is a questionable nodular density in the left lower lobe retrocardiac region measuring 7 mm. The lungs are otherwise clear. There is no pleural effusion or pneumothorax. The cardiomediastinal silhouette is within normal limits. No acute fractures are seen. IMPRESSION: 1. Linear bands of atelectasis in the left lung base. 2. Questionable nodular density in the left lower lobe retrocardiac region measuring 7 mm. Recommend further evaluation with chest CT. Electronically Signed   By: Darliss Cheney M.D.   On: 07/05/2023 21:35   CT CHEST ABDOMEN PELVIS WO CONTRAST Result Date: 07/04/2023 CLINICAL DATA:  Sepsis LLQ pain, SOB, +SIRS, PNA vs intraabd infx EXAM: CT CHEST, ABDOMEN AND PELVIS WITHOUT CONTRAST TECHNIQUE: Multidetector CT imaging of the chest, abdomen and pelvis was performed following the standard protocol without IV contrast. RADIATION DOSE REDUCTION: This exam was performed according to the departmental dose-optimization program which includes automated exposure control, adjustment of the mA and/or kV according to patient size and/or use of iterative reconstruction technique. COMPARISON:  Chest radiograph same date.  Abdominal CTA 03/28/2021. FINDINGS: CT CHEST FINDINGS  Cardiovascular: Atherosclerosis of the aorta, great vessels and coronary arteries. The heart size is normal. There is no pericardial effusion. Mediastinum/Nodes: There are no enlarged mediastinal, hilar or axillary lymph nodes. The thyroid gland, trachea and esophagus demonstrate no significant findings. Lungs/Pleura: No pleural effusion or pneumothorax. New dependent atelectasis at both lung bases. No confluent airspace disease or suspicious pulmonary nodularity. Musculoskeletal/Chest wall: There is an acute burst fracture at T12 resulting in 4 mm of osseous retropulsion and approximately 60% loss of vertebral body height. No other acute fractures are seen in the chest. CT ABDOMEN AND PELVIS FINDINGS Hepatobiliary: The liver demonstrates diffusely decreased density consistent with steatosis. No focal or acute abnormalities are identified on noncontrast imaging. No evidence of gallstones, gallbladder wall thickening or biliary dilatation. Pancreas: Diffuse pancreatic atrophy. No ductal dilatation or surrounding inflammation. Spleen: Normal in size without focal abnormality. Adrenals/Urinary Tract: Both adrenal glands appear normal. No evidence of urinary tract calculus. There is new asymmetric perinephric soft tissue stranding on the left with air in the left intrarenal collecting system and bladder. In addition, there is a small amount of retroperitoneal soft tissue emphysema along the inferior medial aspect of the left kidney. There is no drainable fluid collection. A 3.5 cm cyst in the interpolar region of the right kidney is grossly stable, for which no specific follow-up imaging is recommended. No significant ureteral dilatation. Small amount of air in the bladder lumen. No significant bladder wall thickening or surrounding inflammation. Stomach/Bowel: No enteric contrast administered. The stomach appears unremarkable for its degree of distension. No evidence of bowel wall thickening, distention or surrounding  inflammatory change. Vascular/Lymphatic: There are no enlarged abdominal or pelvic lymph nodes. Aortic and branch vessel atherosclerosis without  evidence of aneurysm. Severe involvement of the proximal left femoral artery. Reproductive: The prostate gland and seminal vesicles appear unremarkable. Other: Stable small umbilical and bilateral inguinal hernias containing only fat. No ascites or pneumoperitoneum. Musculoskeletal: No acute or significant osseous findings. Specifically, no acute fractures are identified within the abdomen or pelvis. Lumbar spondylosis noted. IMPRESSION: 1. Acute burst fracture at T12 with 4 mm of osseous retropulsion and approximately 60% loss of vertebral body height. 2. New asymmetric perinephric soft tissue stranding on the left with air in the left intrarenal collecting system and bladder. In addition, there is a small amount of retroperitoneal soft tissue emphysema along the inferior medial aspect of the left kidney. In the absence of recent instrumentation, findings are suspicious for emphysematous pyelonephritis. No drainable fluid collection. 3. No other acute findings identified in the chest, abdomen or pelvis. 4. Hepatic steatosis. 5.  Aortic Atherosclerosis (ICD10-I70.0). Electronically Signed   By: Carey Bullocks M.D.   On: 07/04/2023 14:18        Scheduled Meds:  amLODipine  10 mg Oral Daily   aspirin EC  81 mg Oral Daily   carvedilol  12.5 mg Oral BID WC   clopidogrel  75 mg Oral Daily   finasteride  5 mg Oral QPM   heparin  5,000 Units Subcutaneous Q8H   insulin aspart  0-9 Units Subcutaneous Q4H   pantoprazole  40 mg Oral Q1200   tamsulosin  0.4 mg Oral BID   Continuous Infusions:  cefTRIAXone (ROCEPHIN)  IV 2 g (07/06/23 0904)     LOS: 2 days       Huey Bienenstock, MD [facility]   To contact the attending provider between 7A-7P or the covering provider during after hours 7P-7A, please log into the web site [email or web address] and access  using universal [place] password for that web site. If you do not have the password, please call the hospital operator.  07/06/2023, 11:54 AM

## 2023-07-06 NOTE — Plan of Care (Signed)
  Problem: Education: Goal: Ability to describe self-care measures that may prevent or decrease complications (Diabetes Survival Skills Education) will improve Outcome: Progressing Goal: Individualized Educational Video(s) Outcome: Progressing   Problem: Coping: Goal: Ability to adjust to condition or change in health will improve Outcome: Progressing   Problem: Fluid Volume: Goal: Ability to maintain a balanced intake and output will improve Outcome: Progressing   Problem: Health Behavior/Discharge Planning: Goal: Ability to identify and utilize available resources and services will improve Outcome: Progressing Goal: Ability to manage health-related needs will improve Outcome: Progressing   Problem: Metabolic: Goal: Ability to maintain appropriate glucose levels will improve Outcome: Progressing   Problem: Nutritional: Goal: Maintenance of adequate nutrition will improve Outcome: Progressing Goal: Progress toward achieving an optimal weight will improve Outcome: Progressing   Problem: Education: Goal: Knowledge of General Education information will improve Description: Including pain rating scale, medication(s)/side effects and non-pharmacologic comfort measures Outcome: Progressing   Problem: Clinical Measurements: Goal: Ability to maintain clinical measurements within normal limits will improve Outcome: Progressing Goal: Will remain free from infection Outcome: Progressing Goal: Diagnostic test results will improve Outcome: Progressing Goal: Respiratory complications will improve Outcome: Progressing Goal: Cardiovascular complication will be avoided Outcome: Progressing

## 2023-07-06 NOTE — Progress Notes (Signed)
 Physical Therapy Discharge Note Patient Details Name: JERRELLE MICHELSEN MRN: [id number] DOB: 17-Apr-1951 Today's Date: 07/06/2023   History of Present Illness 73 year old male presents to the ED 3/6 with complaint of lower quadrant abdominal pain. 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 coli bacteremia, AKI  Hyperkalemia, Metabolic acidosis, Acute burst fracture T12 with no trauma per history. PMHx: type 2 diabetes mellitus on insulin, hypertension, hyperlipidemia, peripheral artery disease    PT Comments  Pt has met goals and is moving at baseline. Pt currently Mod I to ind with all functional activities. Currently pt is presenting at baseline level of functioning and no skilled physical therapy services recommended. Pt will be discharged from skilled physical therapy services at this time; please re-consult if further needs arise.              Equipment Recommendations  None recommended by PT       Precautions / Restrictions Precautions Precautions: Fall Recall of Precautions/Restrictions: Intact Restrictions Weight Bearing Restrictions Per Provider Order: No     Mobility  Bed Mobility Overal bed mobility: Modified Independent Bed Mobility: Rolling, Sidelying to Sit, Sit to Supine Rolling: Modified independent (Device/Increase time) Sidelying to sit: Modified independent (Device/Increase time)   Sit to supine: Modified independent (Device/Increase time)        Transfers Overall transfer level: Modified independent Equipment used: None Transfers: Sit to/from Stand Sit to Stand: Modified independent (Device/Increase time)      Ambulation/Gait Ambulation/Gait assistance: Modified independent (Device/Increase time) Gait Distance (Feet): 60 Feet Assistive device: None Gait Pattern/deviations: Step-through pattern, Antalgic Gait velocity: decreased slightly Gait velocity interpretation: >2.62 ft/sec, indicative of community ambulatory   General  Gait Details: pt states he always has slight antalgic gait but is currently worse due to kidney infection   Stairs Stairs:  (demonstrates adequate strength for navigating stairs per home set up with use of rail at Mod I as demonstrated by gait and sit to stand)              Balance Overall balance assessment: Mild deficits observed, not formally tested Sitting-balance support: No upper extremity supported, Feet supported Sitting balance-Leahy Scale: Normal     Standing balance support: During functional activity, No upper extremity supported Standing balance-Leahy Scale: Good Standing balance comment: no overt LOB        Communication Communication Communication: No apparent difficulties  Cognition Arousal: Alert Behavior During Therapy: WFL for tasks assessed/performed   PT - Cognitive impairments: No apparent impairments       Following commands: Intact            General Comments General comments (skin integrity, edema, etc.): Pt O2 sats down to 87% on 3L O2 after 60 ft of gait.      Pertinent Vitals/Pain Pain Assessment Pain Assessment: Faces Faces Pain Scale: Hurts a little bit Pain Location: Lt lower back from kidney per pt Pain Descriptors / Indicators: Aching Pain Intervention(s): Monitored during session, Limited activity within patient's tolerance           PT Goals (current goals can now be found in the care plan section) Progress towards PT goals: Goals met and updated - see care plan;Goals met/education completed, patient discharged from PT       PT Plan  DC PT services       AM-PAC PT "6 Clicks" Mobility   Outcome Measure  Help needed turning from your back to your side while in a flat bed without using bedrails?:  None Help needed moving from lying on your back to sitting on the side of a flat bed without using bedrails?: None Help needed moving to and from a bed to a chair (including a wheelchair)?: None Help needed standing up from a  chair using your arms (e.g., wheelchair or bedside chair)?: None Help needed to walk in hospital room?: None Help needed climbing 3-5 steps with a railing? : A Little 6 Click Score: 23    End of Session Equipment Utilized During Treatment: Oxygen Activity Tolerance: Patient tolerated treatment well Patient left: in bed;with call bell/phone within reach Nurse Communication: Mobility status PT Visit Diagnosis: Unsteadiness on feet (R26.81);History of falling (Z91.81);Muscle weakness (generalized) (M62.81);Difficulty in walking, not elsewhere classified (R26.2);Pain Pain - Right/Left: Left Pain - part of body:  (back)     Time: 1200-1208 PT Time Calculation (min) (ACUTE ONLY): 8 min  Charges:    $Therapeutic Activity: 8-22 mins PT General Charges $$ ACUTE PT VISIT: 1 Visit                    Harrel Carina, DPT, CLT  Acute Rehabilitation Services Office: [phone] (Secure chat preferred)    Claudia Desanctis 07/06/2023, 12:44 PM

## 2023-07-07 DIAGNOSIS — N179 Acute kidney failure, unspecified: Secondary | ICD-10-CM | POA: Diagnosis not present

## 2023-07-07 DIAGNOSIS — N12 Tubulo-interstitial nephritis, not specified as acute or chronic: Secondary | ICD-10-CM | POA: Diagnosis not present

## 2023-07-07 LAB — CBC
HCT: 37.4 % — ABNORMAL LOW (ref 39.0–52.0)
Hemoglobin: 12.1 g/dL — ABNORMAL LOW (ref 13.0–17.0)
MCH: 27.8 pg (ref 26.0–34.0)
MCHC: 32.4 g/dL (ref 30.0–36.0)
MCV: 86 fL (ref 80.0–100.0)
Platelets: 119 10*3/uL — ABNORMAL LOW (ref 150–400)
RBC: 4.35 MIL/uL (ref 4.22–5.81)
RDW: 14.7 % (ref 11.5–15.5)
WBC: 12.1 10*3/uL — ABNORMAL HIGH (ref 4.0–10.5)
nRBC: 0 % (ref 0.0–0.2)

## 2023-07-07 LAB — URINE CULTURE: Culture: 40000 — AB

## 2023-07-07 LAB — BASIC METABOLIC PANEL
Anion gap: 8 (ref 5–15)
BUN: 30 mg/dL — ABNORMAL HIGH (ref 8–23)
CO2: 27 mmol/L (ref 22–32)
Calcium: 8.8 mg/dL — ABNORMAL LOW (ref 8.9–10.3)
Chloride: 104 mmol/L (ref 98–111)
Creatinine, Ser: 1.34 mg/dL — ABNORMAL HIGH (ref 0.61–1.24)
GFR, Estimated: 55 mL/min — ABNORMAL LOW (ref >60)
Glucose, Bld: 127 mg/dL — ABNORMAL HIGH (ref 70–99)
Potassium: 4.3 mmol/L (ref 3.5–5.1)
Sodium: 139 mmol/L (ref 135–145)

## 2023-07-07 LAB — PROCALCITONIN: Procalcitonin: 33.84 ng/mL

## 2023-07-07 LAB — GLUCOSE, CAPILLARY
Glucose-Capillary: 135 mg/dL — ABNORMAL HIGH (ref 70–99)
Glucose-Capillary: 168 mg/dL — ABNORMAL HIGH (ref 70–99)
Glucose-Capillary: 251 mg/dL — ABNORMAL HIGH (ref 70–99)
Glucose-Capillary: 167 mg/dL — ABNORMAL HIGH (ref 70–99)
Glucose-Capillary: 200 mg/dL — ABNORMAL HIGH (ref 70–99)
Glucose-Capillary: 168 mg/dL — ABNORMAL HIGH (ref 70–99)

## 2023-07-07 LAB — CULTURE, BLOOD (ROUTINE X 2)

## 2023-07-07 LAB — BRAIN NATRIURETIC PEPTIDE: B Natriuretic Peptide: 329.7 pg/mL — ABNORMAL HIGH (ref 0.0–100.0)

## 2023-07-07 MED ORDER — ALBUTEROL SULFATE (2.5 MG/3ML) 0.083% IN NEBU
2.5000 mg | INHALATION_SOLUTION | Freq: Two times a day (BID) | RESPIRATORY_TRACT | Status: DC
  Administered 2023-07-08 – 2023-07-09 (×3): 2.5 mg via RESPIRATORY_TRACT
  Filled 2023-07-07 (×3): qty 3

## 2023-07-07 MED ORDER — LINEZOLID 600 MG/300ML IV SOLN
600.0000 mg | Freq: Two times a day (BID) | INTRAVENOUS | Status: DC
  Administered 2023-07-07: 600 mg via INTRAVENOUS
  Filled 2023-07-07 (×2): qty 300

## 2023-07-07 MED ORDER — FUROSEMIDE 10 MG/ML IJ SOLN
40.0000 mg | Freq: Once | INTRAMUSCULAR | Status: AC
  Administered 2023-07-07: 40 mg via INTRAVENOUS
  Filled 2023-07-07: qty 4

## 2023-07-07 MED ORDER — SODIUM CHLORIDE 0.9 % IV SOLN
2.0000 g | Freq: Four times a day (QID) | INTRAVENOUS | Status: DC
  Administered 2023-07-07 – 2023-07-09 (×6): 2 g via INTRAVENOUS
  Filled 2023-07-07 (×8): qty 2000

## 2023-07-07 NOTE — Plan of Care (Signed)
  Problem: Education: Goal: Ability to describe self-care measures that may prevent or decrease complications (Diabetes Survival Skills Education) will improve Outcome: Progressing Goal: Individualized Educational Video(s) Outcome: Progressing   Problem: Coping: Goal: Ability to adjust to condition or change in health will improve Outcome: Progressing   Problem: Fluid Volume: Goal: Ability to maintain a balanced intake and output will improve Outcome: Progressing   Problem: Health Behavior/Discharge Planning: Goal: Ability to identify and utilize available resources and services will improve Outcome: Progressing Goal: Ability to manage health-related needs will improve Outcome: Progressing   Problem: Nutritional: Goal: Maintenance of adequate nutrition will improve Outcome: Progressing Goal: Progress toward achieving an optimal weight will improve Outcome: Progressing   Problem: Skin Integrity: Goal: Risk for impaired skin integrity will decrease Outcome: Progressing   Problem: Clinical Measurements: Goal: Ability to maintain clinical measurements within normal limits will improve Outcome: Progressing Goal: Will remain free from infection Outcome: Progressing Goal: Diagnostic test results will improve Outcome: Progressing Goal: Respiratory complications will improve Outcome: Progressing Goal: Cardiovascular complication will be avoided Outcome: Progressing

## 2023-07-07 NOTE — Progress Notes (Signed)
 Pharmacy Antibiotic Note  Todd Krause is a 73 y.o. male admitted on 07/04/2023 with E. faecalis pyelonephritis and E. coli bacteremia.  Pharmacy has been consulted for ampicillin dosing.  Plan: Discontinue ceftriaxone Begin ampicillin 2 g q6h IV  Monitor for clinical improvement, DOT, and renal function  Height: 5\' 8"  (172.7 cm) Weight: 95.3 kg (210 lb) IBW/kg (Calculated) : 68.4  Temp (24hrs), Avg:98.1 F (36.7 C), Min:98 F (36.7 C), Max:98.3 F (36.8 C)  Recent Labs  Lab 07/04/23 0754 07/04/23 0834 07/04/23 1018 07/04/23 1023 07/04/23 1808 07/05/23 0522 07/06/23 0527 07/07/23 0526  WBC 20.6*  --   --   --   --  19.4* 19.6* 12.1*  CREATININE  --   --   --  3.17* 2.69* 1.94* 1.68* 1.34*  LATICACIDVEN  --  6.1* 3.4*  --   --   --   --   --     Estimated Creatinine Clearance: 53.4 mL/min (A) (by C-G formula based on SCr of 1.34 mg/dL (H)).    Allergies  Allergen Reactions   Latex Hives   Tramadol Rash   Codeine Other (See Comments)    constipation    Antimicrobials this admission: Cefepime 3/6 x1  Ceftriaxone 3/7 > 3/9  Linezolid 3/9 x1  Ampicillin 3/9 >>   Microbiology results: 3/6 Bcx: Ecoli in 2/4 bottles (pan-sensitive)  3/6 Ucx: 40,000 E coli (pan-sensitive)  negative C. difficile and GI panel  Thank you for allowing pharmacy to be a part of this patient's care.  Roslyn Smiling, PharmD PGY1 Pharmacy Resident 07/07/2023 2:28 PM

## 2023-07-07 NOTE — Progress Notes (Signed)
 PROGRESS NOTE    Todd Krause  ZOX:[id number] DOB: 1950-08-29 DOA: 07/04/2023 PCP: Ladora Daniel, PA-C   Chief Complaint  Patient presents with   Abdominal Pain    Brief Narrative:   73 year old male with past medical history of type 2 diabetes mellitus on insulin 70/30/Jardiance/metformin, hypertension and Aldactone/Diovan HCT Coreg amlodipine, hyperlipidemia, peripheral artery disease presents to the ED with complaint of lower quadrant abdominal pain 07/03/23  morning around 0400 along with nausea vomiting and diarrhea.  -Patient workup significant for severe sepsis, secondary to pyelonephritis, he has E. coli bacteremia as well, admitted for further workup Assessment & Plan:   Principal Problem:   Acute renal failure (HCC) Active Problems:   HYPERCHOLESTEROLEMIA   Essential hypertension   Type 2 diabetes mellitus without complication, with long-term current use of insulin (HCC)   Peripheral arterial disease (HCC)    Severe Sepsis due to Enterococcus faecalis emphysematous pyelonephritis and E. coli bacteremia -Severe sepsis present on admission, -Due to emphysematous Pyelonephritis with E. Coli bacteremia. -Blood culture growing E. coli, pansensitive, continue with IV Rocephin, follow on repeat blood cultures-Broad-spectrum antibiotics, narrowed to Rocephin -Cytosis trending down, procalcitonin trending down which is reassuring -Culture growing Enterococcus faecalis, started on Zyvox, sensitivities is back, sensitive to ampicillin   Acute respiratory failure with hypoxia Bilateral atelectasis -Patient requiring up to 5 L oxygen, he drops to the lower 80s on room air, tachypneic, significant Rales and rhonchi at the bases, CT chest on admission significant for significant atelectasis bilaterally -Was encouraged to use incentive spirometry, and to ambulate -Will keep on scheduled DuoNebs and as needed albuterol -He remains with significant wheezing, but improved oxygen  requirement this morning to 3 L  AKI Hyperkalemia Metabolic acidosis: -Improving with IV fluids -Avoid nephrotoxic medications. -Hyperkalemia resolved with Lokelma   Acute burst fracture T12 : No trauma per history, non tender and no back pain.  Continue pain control.   Type 2 diabetes mellitus w/ hyperglycemia: -A1c is 7.8 -Hold home medication including Jardiance and metformin . -Continue with insulin sliding scale  Diarrhea -Improving, negative C. difficile and GI panel, will DC isolation   Hypertension -Home medications including Diovan, Aldactone, hydrochlorothiazide especially in the setting of AKI and sepsis. - resume Coreg and amlodipine now blood pressure is elevated and he is with tachycardia  Hyperlipidemia: Hold statins   Peripheral artery disease w/ hx of stent in rt leg 1 year ago: PTA on plavix/asa-resume.   Hepatic steatosis in CT; Follow-up outpatient advised weight loss  Obesity class I Body mass index is 31.93 kg/m.    DVT prophylaxis: Heparin Code Status: (Full) Family Communication: Discussed with patient Disposition:   Status is: Inpatient    Consultants:  none   Subjective: Reports he is feeling better today, still complaining of left flank pain  Objective: Vitals:   07/06/23 2350 07/07/23 0343 07/07/23 0806 07/07/23 1139  BP: (!) 138/58 138/62 (!) 149/66 (!) 108/51  Pulse: 75 78 87 67  Resp: (!) 22 18 18 20   Temp: 98 F (36.7 C) 98.3 F (36.8 C) 98 F (36.7 C) 98.2 F (36.8 C)  TempSrc: Oral Oral Oral Oral  SpO2: 95% 91% 94%   Weight:      Height:       No intake or output data in the 24 hours ending 07/07/23 1355  Filed Weights   07/04/23 0748  Weight: 95.3 kg    Examination:   Awake Alert, Oriented X 3, No new F.N frail Symmetrical Chest wall  movement, diminished air entry at the bases with wheezing RRR,No Gallops,Rubs or new Murmurs, No Parasternal Heave +ve B.Sounds, Abd Soft, left flank tenderness still  present. No Cyanosis, Clubbing or edema, No new Rash or bruise       Data Reviewed: I have personally reviewed following labs and imaging studies  CBC: Recent Labs  Lab 07/04/23 0754 07/05/23 0522 07/06/23 0527 07/07/23 0526  WBC 20.6* 19.4* 19.6* 12.1*  NEUTROABS 17.9*  --   --   --   HGB 13.4 12.7* 12.3* 12.1*  HCT 41.3 38.7* 37.8* 37.4*  MCV 87.9 86.6 85.5 86.0  PLT 173 147* 139* 119*    Basic Metabolic Panel: Recent Labs  Lab 07/04/23 1023 07/04/23 1808 07/05/23 0522 07/06/23 0527 07/07/23 0526  NA 135 137 138 138 139  K 6.2* 5.2* 4.9 4.3 4.3  CL 106 106 110 105 104  CO2 16* 17* 19* 23 27  GLUCOSE 232* 197* 135* 178* 127*  BUN 44* 44* 38* 34* 30*  CREATININE 3.17* 2.69* 1.94* 1.68* 1.34*  CALCIUM 7.3* 8.1* 8.0* 8.8* 8.8*  MG  --   --   --  1.7  --   PHOS  --   --   --  2.7  --     GFR: Estimated Creatinine Clearance: 53.4 mL/min (A) (by C-G formula based on SCr of 1.34 mg/dL (H)).  Liver Function Tests: Recent Labs  Lab 07/04/23 1023 07/05/23 0522  AST 36 38  ALT 29 30  ALKPHOS 52 82  BILITOT 0.5 0.5  PROT 5.8* 6.2*  ALBUMIN 2.9* 2.9*    CBG: Recent Labs  Lab 07/06/23 2003 07/06/23 2350 07/07/23 0343 07/07/23 0805 07/07/23 1139  GLUCAP 190* 152* 135* 168* 251*     Recent Results (from the past 240 hours)  Blood Culture (routine x 2)     Status: Abnormal (Preliminary result)   Collection Time: 07/04/23  7:54 AM   Specimen: BLOOD LEFT ARM  Result Value Ref Range Status   Specimen Description BLOOD LEFT ARM  Final   Special Requests   Final    BOTTLES DRAWN AEROBIC AND ANAEROBIC Blood Culture results may not be optimal due to an inadequate volume of blood received in culture bottles   Culture  Setup Time   Final    GRAM NEGATIVE RODS IN BOTH AEROBIC AND ANAEROBIC BOTTLES CRITICAL VALUE NOTED.  VALUE IS CONSISTENT WITH PREVIOUSLY REPORTED AND CALLED VALUE.    Culture (A)  Final    ESCHERICHIA COLI SUSCEPTIBILITIES PERFORMED ON  PREVIOUS CULTURE WITHIN THE LAST 5 DAYS. Performed at [facility], [address] [place], [place] [address]    Report Status PENDING  Incomplete  Blood Culture (routine x 2)     Status: Abnormal   Collection Time: 07/04/23  7:54 AM   Specimen: BLOOD RIGHT ARM  Result Value Ref Range Status   Specimen Description BLOOD RIGHT ARM  Final   Special Requests   Final    BOTTLES DRAWN AEROBIC AND ANAEROBIC Blood Culture results may not be optimal due to an inadequate volume of blood received in culture bottles   Culture  Setup Time   Final    GRAM NEGATIVE RODS IN BOTH AEROBIC AND ANAEROBIC BOTTLES CRITICAL RESULT CALLED TO, READ BACK BY AND VERIFIED WITH: Mercer Pod [id number] @ 2334 FH Performed at [facility], [address] [place], [place] [address]    Culture ESCHERICHIA COLI (A)  Final   Report Status 07/06/2023  FINAL  Final   Organism ID, Bacteria ESCHERICHIA COLI  Final   Organism ID, Bacteria ESCHERICHIA COLI  Final      Susceptibility   Escherichia coli - KIRBY BAUER*    CEFAZOLIN SENSITIVE Sensitive    Escherichia coli - MIC*    AMPICILLIN 8 SENSITIVE Sensitive     CEFEPIME <=0.12 SENSITIVE Sensitive     CEFTAZIDIME <=1 SENSITIVE Sensitive     CEFTRIAXONE <=0.25 SENSITIVE Sensitive     CIPROFLOXACIN <=0.25 SENSITIVE Sensitive     GENTAMICIN <=1 SENSITIVE Sensitive     IMIPENEM <=0.25 SENSITIVE Sensitive     TRIMETH/SULFA <=20 SENSITIVE Sensitive     AMPICILLIN/SULBACTAM <=2 SENSITIVE Sensitive     PIP/TAZO <=4 SENSITIVE Sensitive ug/mL    * ESCHERICHIA COLI    ESCHERICHIA COLI  Blood Culture ID Panel (Reflexed)     Status: Abnormal   Collection Time: 07/04/23  7:54 AM  Result Value Ref Range Status   Enterococcus faecalis NOT DETECTED NOT DETECTED Final   Enterococcus Faecium NOT DETECTED NOT DETECTED Final   Listeria monocytogenes NOT DETECTED NOT DETECTED Final   Staphylococcus species NOT DETECTED NOT DETECTED Final   Staphylococcus aureus (BCID)  NOT DETECTED NOT DETECTED Final   Staphylococcus epidermidis NOT DETECTED NOT DETECTED Final   Staphylococcus lugdunensis NOT DETECTED NOT DETECTED Final   Streptococcus species NOT DETECTED NOT DETECTED Final   Streptococcus agalactiae NOT DETECTED NOT DETECTED Final   Streptococcus pneumoniae NOT DETECTED NOT DETECTED Final   Streptococcus pyogenes NOT DETECTED NOT DETECTED Final   A.calcoaceticus-baumannii NOT DETECTED NOT DETECTED Final   Bacteroides fragilis NOT DETECTED NOT DETECTED Final   Enterobacterales DETECTED (A) NOT DETECTED Final    Comment: Enterobacterales represent a large order of gram negative bacteria, not a single organism. CRITICAL RESULT CALLED TO, READ BACK BY AND VERIFIED WITH: PHARMD V.BRYK [id number] @ 2334 FH    Enterobacter cloacae complex NOT DETECTED NOT DETECTED Final   Escherichia coli DETECTED (A) NOT DETECTED Final    Comment: CRITICAL RESULT CALLED TO, READ BACK BY AND VERIFIED WITH: PHARMD V.BRYK [id number] @ 2334 FH    Klebsiella aerogenes NOT DETECTED NOT DETECTED Final   Klebsiella oxytoca NOT DETECTED NOT DETECTED Final   Klebsiella pneumoniae NOT DETECTED NOT DETECTED Final   Proteus species NOT DETECTED NOT DETECTED Final   Salmonella species NOT DETECTED NOT DETECTED Final   Serratia marcescens NOT DETECTED NOT DETECTED Final   Haemophilus influenzae NOT DETECTED NOT DETECTED Final   Neisseria meningitidis NOT DETECTED NOT DETECTED Final   Pseudomonas aeruginosa NOT DETECTED NOT DETECTED Final   Stenotrophomonas maltophilia NOT DETECTED NOT DETECTED Final   Candida albicans NOT DETECTED NOT DETECTED Final   Candida auris NOT DETECTED NOT DETECTED Final   Candida glabrata NOT DETECTED NOT DETECTED Final   Candida krusei NOT DETECTED NOT DETECTED Final   Candida parapsilosis NOT DETECTED NOT DETECTED Final   Candida tropicalis NOT DETECTED NOT DETECTED Final   Cryptococcus neoformans/gattii NOT DETECTED NOT DETECTED Final   CTX-M ESBL NOT  DETECTED NOT DETECTED Final   Carbapenem resistance IMP NOT DETECTED NOT DETECTED Final   Carbapenem resistance KPC NOT DETECTED NOT DETECTED Final   Carbapenem resistance NDM NOT DETECTED NOT DETECTED Final   Carbapenem resist OXA 48 LIKE NOT DETECTED NOT DETECTED Final   Carbapenem resistance VIM NOT DETECTED NOT DETECTED Final    Comment: Performed at [facility], [address] [place], [place] [address]  Resp panel by  RT-PCR (RSV, Flu A&B, Covid) Anterior Nasal Swab     Status: None   Collection Time: 07/04/23  8:03 AM   Specimen: Anterior Nasal Swab  Result Value Ref Range Status   SARS Coronavirus 2 by RT PCR NEGATIVE NEGATIVE Final   Influenza A by PCR NEGATIVE NEGATIVE Final   Influenza B by PCR NEGATIVE NEGATIVE Final    Comment: (NOTE) The Xpert Xpress SARS-CoV-2/FLU/RSV plus assay is intended as an aid in the diagnosis of influenza from Nasopharyngeal swab specimens and should not be used as a sole basis for treatment. Nasal washings and aspirates are unacceptable for Xpert Xpress SARS-CoV-2/FLU/RSV testing.  Fact Sheet for Patients: BloggerCourse.com  Fact Sheet for Healthcare Providers: SeriousBroker.it  This test is not yet approved or cleared by the [place] FDA and has been authorized for detection and/or diagnosis of SARS-CoV-2 by FDA under an Emergency Use Authorization (EUA). This EUA will remain in effect (meaning this test can be used) for the duration of the COVID-19 declaration under Section 564(b)(1) of the Act, 21 U.S.C. section 360bbb-3(b)(1), unless the authorization is terminated or revoked.     Resp Syncytial Virus by PCR NEGATIVE NEGATIVE Final    Comment: (NOTE) Fact Sheet for Patients: BloggerCourse.com  Fact Sheet for Healthcare Providers: SeriousBroker.it  This test is not yet approved or cleared by the [place] FDA and has  been authorized for detection and/or diagnosis of SARS-CoV-2 by FDA under an Emergency Use Authorization (EUA). This EUA will remain in effect (meaning this test can be used) for the duration of the COVID-19 declaration under Section 564(b)(1) of the Act, 21 U.S.C. section 360bbb-3(b)(1), unless the authorization is terminated or revoked.  Performed at [facility], [address], [place], [place] [address]   Urine Culture     Status: Abnormal (Preliminary result)   Collection Time: 07/04/23  2:48 PM   Specimen: Urine, Random  Result Value Ref Range Status   Specimen Description URINE, RANDOM  Final   Special Requests NONE Reflexed from [id number]  Final   Culture (A)  Final    40,000 COLONIES/mL ENTEROCOCCUS FAECALIS CULTURE REINCUBATED FOR BETTER GROWTH    Report Status PENDING  Incomplete   Organism ID, Bacteria ENTEROCOCCUS FAECALIS (A)  Final      Susceptibility   Enterococcus faecalis - MIC*    AMPICILLIN <=2 SENSITIVE Sensitive     NITROFURANTOIN <=16 SENSITIVE Sensitive     VANCOMYCIN Value in next row Sensitive      1 SENSITIVEPerformed at [facility], [address], [place], [place] [address]    * 40,000 COLONIES/mL ENTEROCOCCUS FAECALIS  C Difficile Quick Screen w PCR reflex     Status: None   Collection Time: 07/05/23  7:42 AM   Specimen: STOOL  Result Value Ref Range Status   C Diff antigen NEGATIVE NEGATIVE Final   C Diff toxin NEGATIVE NEGATIVE Final   C Diff interpretation No C. difficile detected.  Final    Comment: Performed at [facility], [address], [place], [place] [address]  Gastrointestinal Panel by PCR , Stool     Status: None   Collection Time: 07/05/23 12:11 PM   Specimen: Stool  Result Value Ref Range Status   Campylobacter species NOT DETECTED NOT DETECTED Final   Plesimonas shigelloides NOT DETECTED NOT DETECTED Final   Salmonella species NOT DETECTED NOT DETECTED Final   Yersinia enterocolitica NOT DETECTED NOT DETECTED  Final   Vibrio species NOT  DETECTED NOT DETECTED Final   Vibrio cholerae NOT DETECTED NOT DETECTED Final   Enteroaggregative E coli (EAEC) NOT DETECTED NOT DETECTED Final   Enteropathogenic E coli (EPEC) NOT DETECTED NOT DETECTED Final   Enterotoxigenic E coli (ETEC) NOT DETECTED NOT DETECTED Final   Shiga like toxin producing E coli (STEC) NOT DETECTED NOT DETECTED Final   Shigella/Enteroinvasive E coli (EIEC) NOT DETECTED NOT DETECTED Final   Cryptosporidium NOT DETECTED NOT DETECTED Final   Cyclospora cayetanensis NOT DETECTED NOT DETECTED Final   Entamoeba histolytica NOT DETECTED NOT DETECTED Final   Giardia lamblia NOT DETECTED NOT DETECTED Final   Adenovirus F40/41 NOT DETECTED NOT DETECTED Final   Astrovirus NOT DETECTED NOT DETECTED Final   Norovirus GI/GII NOT DETECTED NOT DETECTED Final   Rotavirus A NOT DETECTED NOT DETECTED Final   Sapovirus (I, II, IV, and V) NOT DETECTED NOT DETECTED Final    Comment: Performed at [facility], [address] [place], [place] [address]  Culture, blood (single) w Reflex to ID Panel     Status: None (Preliminary result)   Collection Time: 07/06/23  7:32 AM   Specimen: BLOOD RIGHT HAND  Result Value Ref Range Status   Specimen Description BLOOD RIGHT HAND  Final   Special Requests   Final    BOTTLES DRAWN AEROBIC ONLY Blood Culture results may not be optimal due to an inadequate volume of blood received in culture bottles   Culture   Final    NO GROWTH 1 DAY Performed at [facility], [address] [place], [place] [address]    Report Status PENDING  Incomplete         Radiology Studies: DG Chest Port 1 View Result Date: 07/05/2023 CLINICAL DATA:  Hypoxia EXAM: PORTABLE CHEST 1 VIEW COMPARISON:  Chest x-ray 07/04/2023 FINDINGS: The heart size and mediastinal contours are within normal limits. There are linear bands of atelectasis in the left lung base. There is a questionable nodular density in the left lower lobe  retrocardiac region measuring 7 mm. The lungs are otherwise clear. There is no pleural effusion or pneumothorax. The cardiomediastinal silhouette is within normal limits. No acute fractures are seen. IMPRESSION: 1. Linear bands of atelectasis in the left lung base. 2. Questionable nodular density in the left lower lobe retrocardiac region measuring 7 mm. Recommend further evaluation with chest CT. Electronically Signed   By: Darliss Cheney M.D.   On: 07/05/2023 21:35        Scheduled Meds:  amLODipine  10 mg Oral Daily   aspirin EC  81 mg Oral Daily   carvedilol  12.5 mg Oral BID WC   clopidogrel  75 mg Oral Daily   finasteride  5 mg Oral QPM   heparin  5,000 Units Subcutaneous Q8H   insulin aspart  0-9 Units Subcutaneous Q4H   pantoprazole  40 mg Oral Q1200   tamsulosin  0.4 mg Oral BID   Continuous Infusions:  cefTRIAXone (ROCEPHIN)  IV 2 g (07/07/23 0856)   linezolid (ZYVOX) IV 600 mg (07/07/23 0903)     LOS: 3 days       Huey Bienenstock, MD [facility]   To contact the attending provider between 7A-7P or the covering provider during after hours 7P-7A, please log into the web site [email or web address] and access using universal [place] password for that web site. If you do not have the password, please call the hospital operator.  07/07/2023, 1:55 PM

## 2023-07-08 DIAGNOSIS — A419 Sepsis, unspecified organism: Secondary | ICD-10-CM | POA: Diagnosis not present

## 2023-07-08 DIAGNOSIS — N12 Tubulo-interstitial nephritis, not specified as acute or chronic: Secondary | ICD-10-CM | POA: Diagnosis not present

## 2023-07-08 DIAGNOSIS — R6520 Severe sepsis without septic shock: Secondary | ICD-10-CM | POA: Diagnosis not present

## 2023-07-08 DIAGNOSIS — N179 Acute kidney failure, unspecified: Secondary | ICD-10-CM | POA: Diagnosis not present

## 2023-07-08 LAB — CBC
HCT: 38.6 % — ABNORMAL LOW (ref 39.0–52.0)
Hemoglobin: 12.7 g/dL — ABNORMAL LOW (ref 13.0–17.0)
MCH: 28.2 pg (ref 26.0–34.0)
MCHC: 32.9 g/dL (ref 30.0–36.0)
MCV: 85.8 fL (ref 80.0–100.0)
Platelets: 145 10*3/uL — ABNORMAL LOW (ref 150–400)
RBC: 4.5 MIL/uL (ref 4.22–5.81)
RDW: 14.5 % (ref 11.5–15.5)
WBC: 8.4 10*3/uL (ref 4.0–10.5)
nRBC: 0 % (ref 0.0–0.2)

## 2023-07-08 LAB — BASIC METABOLIC PANEL
Anion gap: 10 (ref 5–15)
BUN: 31 mg/dL — ABNORMAL HIGH (ref 8–23)
CO2: 28 mmol/L (ref 22–32)
Calcium: 8.9 mg/dL (ref 8.9–10.3)
Chloride: 99 mmol/L (ref 98–111)
Creatinine, Ser: 1.32 mg/dL — ABNORMAL HIGH (ref 0.61–1.24)
GFR, Estimated: 56 mL/min — ABNORMAL LOW (ref >60)
Glucose, Bld: 150 mg/dL — ABNORMAL HIGH (ref 70–99)
Potassium: 3.8 mmol/L (ref 3.5–5.1)
Sodium: 137 mmol/L (ref 135–145)

## 2023-07-08 LAB — BRAIN NATRIURETIC PEPTIDE: B Natriuretic Peptide: 174.9 pg/mL — ABNORMAL HIGH (ref 0.0–100.0)

## 2023-07-08 LAB — PROCALCITONIN: Procalcitonin: 22.87 ng/mL

## 2023-07-08 LAB — GLUCOSE, CAPILLARY
Glucose-Capillary: 187 mg/dL — ABNORMAL HIGH (ref 70–99)
Glucose-Capillary: 164 mg/dL — ABNORMAL HIGH (ref 70–99)
Glucose-Capillary: 260 mg/dL — ABNORMAL HIGH (ref 70–99)
Glucose-Capillary: 283 mg/dL — ABNORMAL HIGH (ref 70–99)
Glucose-Capillary: 259 mg/dL — ABNORMAL HIGH (ref 70–99)
Glucose-Capillary: 292 mg/dL — ABNORMAL HIGH (ref 70–99)
Glucose-Capillary: 150 mg/dL — ABNORMAL HIGH (ref 70–99)

## 2023-07-08 NOTE — Care Management Important Message (Signed)
 Important Message  Patient Details  Name: Todd Krause MRN: [id number] Date of Birth: Oct 25, 1950   Important Message Given:  Yes - Medicare IM     Dorena Bodo 07/08/2023, 4:17 PM

## 2023-07-08 NOTE — Progress Notes (Signed)
 Mobility Specialist Progress Note:  Nurse requested Mobility Specialist to perform oxygen saturation test with pt which includes removing pt from oxygen both at rest and while ambulating.  Below are the results from that testing.     Patient Saturations on Room Air at Rest = spO2 95%  Patient Saturations on Room Air while Ambulating = sp02 90% .  Rested and performed pursed lip breathing for 1 minute with sp02 at 93%.  At end of testing pt left in room on 0  Liters of oxygen.  Reported results to nurse.    Thompson Grayer Mobility Specialist  Please contact vis Secure Chat or  Rehab Office [phone]

## 2023-07-08 NOTE — Progress Notes (Signed)
 Mobility Specialist Progress Note:    07/08/23 1234  Mobility  Activity Ambulated with assistance in hallway  Level of Assistance Contact guard assist, steadying assist  Assistive Device Other (Comment) (hand rials)  Distance Ambulated (ft) 400 ft  Activity Response Tolerated well  Mobility Referral Yes  Mobility visit 1 Mobility  Mobility Specialist Start Time (ACUTE ONLY) 1005  Mobility Specialist Stop Time (ACUTE ONLY) 1017  Mobility Specialist Time Calculation (min) (ACUTE ONLY) 12 min   Pt received in chair agreeable to mobility. No physical assistance during session just a contact guard d/t slight unsteadiness. Attempted to ambulate on RA but d/t desating to 86% O2 flow increased to 2L/min to keep SPO2 WFL. Returned to room w/o fault. Situated in chair, call bell and personal belongings in reach. All needs met.   Thompson Grayer Mobility Specialist  Please contact vis Secure Chat or  Rehab Office [phone]

## 2023-07-08 NOTE — Inpatient Diabetes Management (Signed)
 Inpatient Diabetes Program Recommendations  AACE/ADA: New Consensus Statement on Inpatient Glycemic Control   Target Ranges:  Prepandial:   less than 140 mg/dL      Peak postprandial:   less than 180 mg/dL (1-2 hours)      Critically ill patients:  140 - 180 mg/dL    Latest Reference Range & Units 07/07/23 08:05 07/07/23 11:39 07/07/23 15:52 07/07/23 19:43 07/07/23 23:10 07/08/23 03:21 07/08/23 07:48 07/08/23 12:26  Glucose-Capillary 70 - 99 mg/dL 086 (H) 578 (H) 469 (H) 200 (H) 168 (H) 187 (H) 164 (H) 260 (H)   Review of Glycemic Control  Diabetes history: DM2 Outpatient Diabetes medications: Jardiance 25 mg daily, 75/25 20 units QAM, 15 QPM, Metformin 1000 mg BID Current orders for Inpatient glycemic control: Novolog 0-9 units Q4H  Inpatient Diabetes Program Recommendations:    Insulin: Please consider ordering Lantus 5 units Q24H.  Thanks, Orlando Penner, RN, MSN, CDCES Diabetes Coordinator Inpatient Diabetes Program [phone] (Team Pager from 8am to 5pm)

## 2023-07-08 NOTE — TOC Progression Note (Signed)
 Transition of Care [facility]) - Progression Note    Patient Details  Name: Todd Krause MRN: [id number] Date of Birth: 13-Feb-1951  [facility] CM/SW Contact  Ronny Bacon, RN Phone Number: 07/08/2023, 2:39 PM  Clinical Narrative:  Patient noted to have oxygen level of 86% on room air when ambulating today. Patient on 2 L of Oxygen currently. Spoke with patient, does not have home oxygen at this time. Referral for home oxygen given to [facility] with [employer] to be delivered closer to when patient is discharged.           Expected Discharge Plan and Services       Living arrangements for the past 2 months: Mobile Home                 DME Arranged: Oxygen DME Agency: [employer] Date DME Agency Contacted: 07/08/23 Time DME Agency Contacted: 1438 Representative spoke with at DME Agency: Vaughan Basta             Social Determinants of Health (SDOH) Interventions SDOH Screenings   Food Insecurity: Food Insecurity Present (07/04/2023)  Housing: Low Risk  (07/04/2023)  Transportation Needs: No Transportation Needs (07/04/2023)  Utilities: Not At Risk (07/04/2023)  Financial Resource Strain: High Risk (05/03/2023)   Received from [facility]  Physical Activity: Insufficiently Active (05/03/2023)   Received from [facility]  Social Connections: Socially Isolated (07/04/2023)  Stress: No Stress Concern Present (05/03/2023)   Received from [facility]  Tobacco Use: Medium Risk (07/04/2023)    Readmission Risk Interventions     No data to display

## 2023-07-08 NOTE — Progress Notes (Signed)
 PROGRESS NOTE    Todd Krause  ZOX:[id number] DOB: [birth year] DOA: 07/04/2023 PCP: Ladora Daniel, PA-C   Chief Complaint  Patient presents with   Abdominal Pain    Brief Narrative:   73 year old male with past medical history of type 2 diabetes mellitus on insulin 70/30/Jardiance/metformin, hypertension and Aldactone/Diovan HCT Coreg amlodipine, hyperlipidemia, peripheral artery disease presents to the ED with complaint of lower quadrant abdominal pain 07/03/23  morning around 0400 along with nausea vomiting and diarrhea.  -Patient workup significant for severe sepsis, secondary to pyelonephritis, he has E. coli bacteremia as well, culture growing coccus, sensitive to ampicillin.  Assessment & Plan:   Principal Problem:   Acute renal failure (HCC) Active Problems:   HYPERCHOLESTEROLEMIA   Essential hypertension   Type 2 diabetes mellitus without complication, with long-term current use of insulin (HCC)   Peripheral arterial disease (HCC)    Severe Sepsis due to Enterococcus faecalis emphysematous pyelonephritis and E. coli bacteremia -Severe sepsis present on admission, -Due to emphysematous Pyelonephritis with E. Coli bacteremia. -Blood culture growing E. coli, pansensitive, continue with IV Rocephin, follow on repeat blood cultures-Broad-spectrum antibiotics, narrowed to Rocephin -Cytosis trending down, procalcitonin trending down which is reassuring -During culture growing Enterococcus faecalis, started on Zyvox, sensitivities is back, sensitive to ampicillin -Will narrow antibiotics to IV ampicillin, continue another 24 hours on IV antibiotics given his bacteremia, and hopefully can transition to p.o. antibiotics thereafter upon discharge.   Acute respiratory failure with hypoxia Bilateral atelectasis -Patient requiring up to 5 L oxygen, he drops to the lower 80s on room air, tachypneic, significant Rales and rhonchi at the bases, CT chest on admission significant for  significant atelectasis bilaterally -Was encouraged to use incentive spirometry, and to ambulate -Will keep on scheduled DuoNebs and as needed albuterol -Improved oxygen requirement, 2 L oxygen this morning,  AKI Hyperkalemia Metabolic acidosis: -Improving with IV fluids -Avoid nephrotoxic medications. -Hyperkalemia resolved with Lokelma   Acute burst fracture T12 : No trauma per history, non tender and no back pain.  Continue pain control.   Type 2 diabetes mellitus w/ hyperglycemia: -A1c is 7.8 -Hold home medication including Jardiance and metformin . -Continue with insulin sliding scale  Diarrhea -Improving, negative C. difficile and GI panel, will DC isolation   Hypertension -Home medications including Diovan, Aldactone, hydrochlorothiazide especially in the setting of AKI and sepsis. - resume Coreg and amlodipine now blood pressure is elevated and he is with tachycardia  Hyperlipidemia: Hold statins   Peripheral artery disease w/ hx of stent in rt leg 1 year ago: PTA on plavix/asa-resume.   Hepatic steatosis in CT; Follow-up outpatient advised weight loss  Obesity class I Body mass index is 31.93 kg/m.    DVT prophylaxis: Heparin Code Status: (Full) Family Communication: Discussed with patient Disposition:   Status is: Inpatient    Consultants:  none   Subjective: Patient reports he is feeling better today, left flank pain almost resolved,  Objective: Vitals:   07/07/23 2325 07/08/23 0600 07/08/23 0737 07/08/23 0841  BP: 125/72 (!) 141/59  (!) 156/107  Pulse: 82 73  78  Resp: 20 20    Temp: 98.8 F (37.1 C)     TempSrc: Oral     SpO2: 95% 96% 99%   Weight:      Height:        Intake/Output Summary (Last 24 hours) at 07/08/2023 1121 Last data filed at 07/08/2023 0603 Gross per 24 hour  Intake 1252.7 ml  Output 3400 ml  Net -2147.3 ml    Filed Weights   07/04/23 0748  Weight: 95.3 kg    Examination:   Awake Alert, Oriented X 3, No  new F.N deficits, Normal affect Symmetrical Chest wall movement, air entry at the bases RRR,No Gallops,Rubs or new Murmurs, No Parasternal Heave +ve B.Sounds, Abd Soft, left CVA tenderness almost resolved  no Cyanosis, Clubbing or edema, No new Rash or bruise        Data Reviewed: I have personally reviewed following labs and imaging studies  CBC: Recent Labs  Lab 07/04/23 0754 07/05/23 0522 07/06/23 0527 07/07/23 0526 07/08/23 0435  WBC 20.6* 19.4* 19.6* 12.1* 8.4  NEUTROABS 17.9*  --   --   --   --   HGB 13.4 12.7* 12.3* 12.1* 12.7*  HCT 41.3 38.7* 37.8* 37.4* 38.6*  MCV 87.9 86.6 85.5 86.0 85.8  PLT 173 147* 139* 119* 145*    Basic Metabolic Panel: Recent Labs  Lab 07/04/23 1808 07/05/23 0522 07/06/23 0527 07/07/23 0526 07/08/23 0435  NA 137 138 138 139 137  K 5.2* 4.9 4.3 4.3 3.8  CL 106 110 105 104 99  CO2 17* 19* 23 27 28   GLUCOSE 197* 135* 178* 127* 150*  BUN 44* 38* 34* 30* 31*  CREATININE 2.69* 1.94* 1.68* 1.34* 1.32*  CALCIUM 8.1* 8.0* 8.8* 8.8* 8.9  MG  --   --  1.7  --   --   PHOS  --   --  2.7  --   --     GFR: Estimated Creatinine Clearance: 54.2 mL/min (A) (by C-G formula based on SCr of 1.32 mg/dL (H)).  Liver Function Tests: Recent Labs  Lab 07/04/23 1023 07/05/23 0522  AST 36 38  ALT 29 30  ALKPHOS 52 82  BILITOT 0.5 0.5  PROT 5.8* 6.2*  ALBUMIN 2.9* 2.9*    CBG: Recent Labs  Lab 07/07/23 1552 07/07/23 1943 07/07/23 2310 07/08/23 0321 07/08/23 0748  GLUCAP 167* 200* 168* 187* 164*     Recent Results (from the past 240 hours)  Blood Culture (routine x 2)     Status: Abnormal   Collection Time: 07/04/23  7:54 AM   Specimen: BLOOD LEFT ARM  Result Value Ref Range Status   Specimen Description BLOOD LEFT ARM  Final   Special Requests   Final    BOTTLES DRAWN AEROBIC AND ANAEROBIC Blood Culture results may not be optimal due to an inadequate volume of blood received in culture bottles   Culture  Setup Time   Final     GRAM NEGATIVE RODS IN BOTH AEROBIC AND ANAEROBIC BOTTLES CRITICAL VALUE NOTED.  VALUE IS CONSISTENT WITH PREVIOUSLY REPORTED AND CALLED VALUE.    Culture (A)  Final    ESCHERICHIA COLI SUSCEPTIBILITIES PERFORMED ON PREVIOUS CULTURE WITHIN THE LAST 5 DAYS. Performed at [facility], [address] [place], [place] [address]    Report Status 07/07/2023 FINAL  Final  Blood Culture (routine x 2)     Status: Abnormal   Collection Time: 07/04/23  7:54 AM   Specimen: BLOOD RIGHT ARM  Result Value Ref Range Status   Specimen Description BLOOD RIGHT ARM  Final   Special Requests   Final    BOTTLES DRAWN AEROBIC AND ANAEROBIC Blood Culture results may not be optimal due to an inadequate volume of blood received in culture bottles   Culture  Setup Time   Final    GRAM NEGATIVE RODS IN BOTH AEROBIC AND ANAEROBIC  BOTTLES CRITICAL RESULT CALLED TO, READ BACK BY AND VERIFIED WITH: Mercer Pod [id number] @ 2334 FH Performed at [facility], [address], [place], [place] [address]    Culture ESCHERICHIA COLI (A)  Final   Report Status 07/06/2023 FINAL  Final   Organism ID, Bacteria ESCHERICHIA COLI  Final   Organism ID, Bacteria ESCHERICHIA COLI  Final      Susceptibility   Escherichia coli - KIRBY BAUER*    CEFAZOLIN SENSITIVE Sensitive    Escherichia coli - MIC*    AMPICILLIN 8 SENSITIVE Sensitive     CEFEPIME <=0.12 SENSITIVE Sensitive     CEFTAZIDIME <=1 SENSITIVE Sensitive     CEFTRIAXONE <=0.25 SENSITIVE Sensitive     CIPROFLOXACIN <=0.25 SENSITIVE Sensitive     GENTAMICIN <=1 SENSITIVE Sensitive     IMIPENEM <=0.25 SENSITIVE Sensitive     TRIMETH/SULFA <=20 SENSITIVE Sensitive     AMPICILLIN/SULBACTAM <=2 SENSITIVE Sensitive     PIP/TAZO <=4 SENSITIVE Sensitive ug/mL    * ESCHERICHIA COLI    ESCHERICHIA COLI  Blood Culture ID Panel (Reflexed)     Status: Abnormal   Collection Time: 07/04/23  7:54 AM  Result Value Ref Range Status   Enterococcus faecalis NOT DETECTED  NOT DETECTED Final   Enterococcus Faecium NOT DETECTED NOT DETECTED Final   Listeria monocytogenes NOT DETECTED NOT DETECTED Final   Staphylococcus species NOT DETECTED NOT DETECTED Final   Staphylococcus aureus (BCID) NOT DETECTED NOT DETECTED Final   Staphylococcus epidermidis NOT DETECTED NOT DETECTED Final   Staphylococcus lugdunensis NOT DETECTED NOT DETECTED Final   Streptococcus species NOT DETECTED NOT DETECTED Final   Streptococcus agalactiae NOT DETECTED NOT DETECTED Final   Streptococcus pneumoniae NOT DETECTED NOT DETECTED Final   Streptococcus pyogenes NOT DETECTED NOT DETECTED Final   A.calcoaceticus-baumannii NOT DETECTED NOT DETECTED Final   Bacteroides fragilis NOT DETECTED NOT DETECTED Final   Enterobacterales DETECTED (A) NOT DETECTED Final    Comment: Enterobacterales represent a large order of gram negative bacteria, not a single organism. CRITICAL RESULT CALLED TO, READ BACK BY AND VERIFIED WITH: PHARMD V.BRYK [id number] @ 2334 FH    Enterobacter cloacae complex NOT DETECTED NOT DETECTED Final   Escherichia coli DETECTED (A) NOT DETECTED Final    Comment: CRITICAL RESULT CALLED TO, READ BACK BY AND VERIFIED WITH: PHARMD V.BRYK [id number] @ 2334 FH    Klebsiella aerogenes NOT DETECTED NOT DETECTED Final   Klebsiella oxytoca NOT DETECTED NOT DETECTED Final   Klebsiella pneumoniae NOT DETECTED NOT DETECTED Final   Proteus species NOT DETECTED NOT DETECTED Final   Salmonella species NOT DETECTED NOT DETECTED Final   Serratia marcescens NOT DETECTED NOT DETECTED Final   Haemophilus influenzae NOT DETECTED NOT DETECTED Final   Neisseria meningitidis NOT DETECTED NOT DETECTED Final   Pseudomonas aeruginosa NOT DETECTED NOT DETECTED Final   Stenotrophomonas maltophilia NOT DETECTED NOT DETECTED Final   Candida albicans NOT DETECTED NOT DETECTED Final   Candida auris NOT DETECTED NOT DETECTED Final   Candida glabrata NOT DETECTED NOT DETECTED Final   Candida krusei NOT  DETECTED NOT DETECTED Final   Candida parapsilosis NOT DETECTED NOT DETECTED Final   Candida tropicalis NOT DETECTED NOT DETECTED Final   Cryptococcus neoformans/gattii NOT DETECTED NOT DETECTED Final   CTX-M ESBL NOT DETECTED NOT DETECTED Final   Carbapenem resistance IMP NOT DETECTED NOT DETECTED Final   Carbapenem resistance KPC NOT DETECTED NOT DETECTED Final   Carbapenem resistance NDM NOT DETECTED NOT DETECTED  Final   Carbapenem resist OXA 48 LIKE NOT DETECTED NOT DETECTED Final   Carbapenem resistance VIM NOT DETECTED NOT DETECTED Final    Comment: Performed at [facility], [address], [place], [place] [address]  Resp panel by RT-PCR (RSV, Flu A&B, Covid) Anterior Nasal Swab     Status: None   Collection Time: 07/04/23  8:03 AM   Specimen: Anterior Nasal Swab  Result Value Ref Range Status   SARS Coronavirus 2 by RT PCR NEGATIVE NEGATIVE Final   Influenza A by PCR NEGATIVE NEGATIVE Final   Influenza B by PCR NEGATIVE NEGATIVE Final    Comment: (NOTE) The Xpert Xpress SARS-CoV-2/FLU/RSV plus assay is intended as an aid in the diagnosis of influenza from Nasopharyngeal swab specimens and should not be used as a sole basis for treatment. Nasal washings and aspirates are unacceptable for Xpert Xpress SARS-CoV-2/FLU/RSV testing.  Fact Sheet for Patients: BloggerCourse.com  Fact Sheet for Healthcare Providers: SeriousBroker.it  This test is not yet approved or cleared by the [place] FDA and has been authorized for detection and/or diagnosis of SARS-CoV-2 by FDA under an Emergency Use Authorization (EUA). This EUA will remain in effect (meaning this test can be used) for the duration of the COVID-19 declaration under Section 564(b)(1) of the Act, 21 U.S.C. section 360bbb-3(b)(1), unless the authorization is terminated or revoked.     Resp Syncytial Virus by PCR NEGATIVE NEGATIVE Final    Comment: (NOTE) Fact  Sheet for Patients: BloggerCourse.com  Fact Sheet for Healthcare Providers: SeriousBroker.it  This test is not yet approved or cleared by the [place] FDA and has been authorized for detection and/or diagnosis of SARS-CoV-2 by FDA under an Emergency Use Authorization (EUA). This EUA will remain in effect (meaning this test can be used) for the duration of the COVID-19 declaration under Section 564(b)(1) of the Act, 21 U.S.C. section 360bbb-3(b)(1), unless the authorization is terminated or revoked.  Performed at [facility], [address] [place], [place] [address]   Urine Culture     Status: Abnormal   Collection Time: 07/04/23  2:48 PM   Specimen: Urine, Random  Result Value Ref Range Status   Specimen Description URINE, RANDOM  Final   Special Requests NONE Reflexed from [id number]  Final   Culture (A)  Final    40,000 COLONIES/mL ENTEROCOCCUS FAECALIS 10,000 COLONIES/mL LACTOBACILLUS SPECIES Standardized susceptibility testing for this organism is not available. Performed at [facility], [address] [place], [place] [address]    Report Status 07/07/2023 FINAL  Final   Organism ID, Bacteria ENTEROCOCCUS FAECALIS (A)  Final      Susceptibility   Enterococcus faecalis - MIC*    AMPICILLIN <=2 SENSITIVE Sensitive     NITROFURANTOIN <=16 SENSITIVE Sensitive     VANCOMYCIN 1 SENSITIVE Sensitive     * 40,000 COLONIES/mL ENTEROCOCCUS FAECALIS  C Difficile Quick Screen w PCR reflex     Status: None   Collection Time: 07/05/23  7:42 AM   Specimen: STOOL  Result Value Ref Range Status   C Diff antigen NEGATIVE NEGATIVE Final   C Diff toxin NEGATIVE NEGATIVE Final   C Diff interpretation No C. difficile detected.  Final    Comment: Performed at [facility], [address], [place], [place] [address]  Gastrointestinal Panel by PCR , Stool     Status: None   Collection Time: 07/05/23 12:11 PM   Specimen:  Stool  Result Value  Ref Range Status   Campylobacter species NOT DETECTED NOT DETECTED Final   Plesimonas shigelloides NOT DETECTED NOT DETECTED Final   Salmonella species NOT DETECTED NOT DETECTED Final   Yersinia enterocolitica NOT DETECTED NOT DETECTED Final   Vibrio species NOT DETECTED NOT DETECTED Final   Vibrio cholerae NOT DETECTED NOT DETECTED Final   Enteroaggregative E coli (EAEC) NOT DETECTED NOT DETECTED Final   Enteropathogenic E coli (EPEC) NOT DETECTED NOT DETECTED Final   Enterotoxigenic E coli (ETEC) NOT DETECTED NOT DETECTED Final   Shiga like toxin producing E coli (STEC) NOT DETECTED NOT DETECTED Final   Shigella/Enteroinvasive E coli (EIEC) NOT DETECTED NOT DETECTED Final   Cryptosporidium NOT DETECTED NOT DETECTED Final   Cyclospora cayetanensis NOT DETECTED NOT DETECTED Final   Entamoeba histolytica NOT DETECTED NOT DETECTED Final   Giardia lamblia NOT DETECTED NOT DETECTED Final   Adenovirus F40/41 NOT DETECTED NOT DETECTED Final   Astrovirus NOT DETECTED NOT DETECTED Final   Norovirus GI/GII NOT DETECTED NOT DETECTED Final   Rotavirus A NOT DETECTED NOT DETECTED Final   Sapovirus (I, II, IV, and V) NOT DETECTED NOT DETECTED Final    Comment: Performed at [facility], [address] [place], [place] [address]  Culture, blood (single) w Reflex to ID Panel     Status: None (Preliminary result)   Collection Time: 07/06/23  7:32 AM   Specimen: BLOOD RIGHT HAND  Result Value Ref Range Status   Specimen Description BLOOD RIGHT HAND  Final   Special Requests   Final    BOTTLES DRAWN AEROBIC ONLY Blood Culture results may not be optimal due to an inadequate volume of blood received in culture bottles   Culture   Final    NO GROWTH 2 DAYS Performed at [facility], [address] [place], [place] [address]    Report Status PENDING  Incomplete         Radiology Studies: No results found.       Scheduled Meds:  albuterol  2.5 mg  Nebulization BID   amLODipine  10 mg Oral Daily   aspirin EC  81 mg Oral Daily   carvedilol  12.5 mg Oral BID WC   clopidogrel  75 mg Oral Daily   finasteride  5 mg Oral QPM   heparin  5,000 Units Subcutaneous Q8H   insulin aspart  0-9 Units Subcutaneous Q4H   pantoprazole  40 mg Oral Q1200   tamsulosin  0.4 mg Oral BID   Continuous Infusions:  ampicillin (OMNIPEN) IV 2 g (07/08/23 0601)     LOS: 4 days       Huey Bienenstock, MD [facility]   To contact the attending provider between 7A-7P or the covering provider during after hours 7P-7A, please log into the web site [email or web address] and access using universal [place] password for that web site. If you do not have the password, please call the hospital operator.  07/08/2023, 11:21 AM

## 2023-07-09 ENCOUNTER — Other Ambulatory Visit (HOSPITAL_COMMUNITY): Payer: Self-pay

## 2023-07-09 DIAGNOSIS — R6520 Severe sepsis without septic shock: Secondary | ICD-10-CM | POA: Diagnosis not present

## 2023-07-09 DIAGNOSIS — N179 Acute kidney failure, unspecified: Secondary | ICD-10-CM | POA: Diagnosis not present

## 2023-07-09 DIAGNOSIS — A419 Sepsis, unspecified organism: Secondary | ICD-10-CM | POA: Diagnosis not present

## 2023-07-09 LAB — CBC
HCT: 37.8 % — ABNORMAL LOW (ref 39.0–52.0)
Hemoglobin: 12.7 g/dL — ABNORMAL LOW (ref 13.0–17.0)
MCH: 28.5 pg (ref 26.0–34.0)
MCHC: 33.6 g/dL (ref 30.0–36.0)
MCV: 84.8 fL (ref 80.0–100.0)
Platelets: 176 10*3/uL (ref 150–400)
RBC: 4.46 MIL/uL (ref 4.22–5.81)
RDW: 14 % (ref 11.5–15.5)
WBC: 8.3 10*3/uL (ref 4.0–10.5)
nRBC: 0 % (ref 0.0–0.2)

## 2023-07-09 LAB — BASIC METABOLIC PANEL
Anion gap: 6 (ref 5–15)
BUN: 29 mg/dL — ABNORMAL HIGH (ref 8–23)
CO2: 28 mmol/L (ref 22–32)
Calcium: 8 mg/dL — ABNORMAL LOW (ref 8.9–10.3)
Chloride: 100 mmol/L (ref 98–111)
Creatinine, Ser: 1.28 mg/dL — ABNORMAL HIGH (ref 0.61–1.24)
GFR, Estimated: 58 mL/min — ABNORMAL LOW (ref >60)
Glucose, Bld: 188 mg/dL — ABNORMAL HIGH (ref 70–99)
Potassium: 3.6 mmol/L (ref 3.5–5.1)
Sodium: 134 mmol/L — ABNORMAL LOW (ref 135–145)

## 2023-07-09 LAB — GLUCOSE, CAPILLARY
Glucose-Capillary: 183 mg/dL — ABNORMAL HIGH (ref 70–99)
Glucose-Capillary: 187 mg/dL — ABNORMAL HIGH (ref 70–99)

## 2023-07-09 MED ORDER — VALSARTAN-HYDROCHLOROTHIAZIDE 320-25 MG PO TABS: 0.5000 | ORAL_TABLET | Freq: Every day | ORAL | Status: DC

## 2023-07-09 MED ORDER — SPIRONOLACTONE 25 MG PO TABS
25.0000 mg | ORAL_TABLET | Freq: Every day | ORAL | Status: DC
Start: 2023-07-12 — End: 2023-07-17

## 2023-07-09 MED ORDER — AMOXICILLIN 500 MG PO CAPS
1000.0000 mg | ORAL_CAPSULE | Freq: Three times a day (TID) | ORAL | 0 refills | Status: AC
Start: 2023-07-09 — End: 2023-07-17
  Filled 2023-07-09: qty 48, 8d supply, fill #0

## 2023-07-09 MED ORDER — OYSTER SHELL CALCIUM/D3 500-5 MG-MCG PO TABS: 1.0000 | ORAL_TABLET | Freq: Three times a day (TID) | ORAL | Status: AC

## 2023-07-09 MED ORDER — ACETAMINOPHEN 325 MG PO TABS: 650.0000 mg | ORAL_TABLET | Freq: Four times a day (QID) | ORAL | Status: AC | PRN

## 2023-07-09 NOTE — Discharge Summary (Signed)
 Physician Discharge Summary  Todd Krause ZOX:[id number] DOB: [birth year] DOA: 07/04/2023  PCP: Ladora Daniel, PA-C  Admit date: 07/04/2023 Discharge date: 07/09/2023  Admitted From: (Home) Disposition:  (Home )  Recommendations for Outpatient Follow-up:  Follow up with PCP in 1-2 weeks Please obtain BMP/CBC in one week Avoid Jardiance and other SGLT inhibitors in the future   Diet recommendation: Heart Healthy / Carb Modified  Brief/Interim Summary:  73 year old male with past medical history of type 2 diabetes mellitus on insulin 70/30/Jardiance/metformin, hypertension and Aldactone/Diovan HCT Coreg amlodipine, hyperlipidemia, peripheral artery disease presents to the ED with complaint of lower quadrant abdominal pain 07/03/23  morning around 0400 along with nausea vomiting and diarrhea.  -Patient workup significant for severe sepsis, secondary to pyelonephritis, he has E. coli bacteremia as well, urine culture growing Enterococcus, sensitive to ampicillin.    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 coli bacteremia -Severe sepsis present on admission, -Sepsis due to emphysematous Pyelonephritis with E. Coli bacteremia. -Blood culture growing E. coli, pansensitive, surveillance blood cultures remain negative at time of discharge -White blood cell count has normalized, procalcitonin significantly trending down  -Urine culture growing Enterococcus faecalis, started on Zyvox, sensitivities is back, sensitive to ampicillin -Initially on IV Rocephin, then transition to IV ampicillin during hospital stay, finish total of 5 days of IV antibiotics, he will be transition to amoxicillin on time of discharge to finish total of 14 days treatment for bacteremia and pyelonephritis .    Acute respiratory failure with hypoxia Bilateral atelectasis -Patient requiring up to 5 L oxygen, he drops to the lower 80s on room air, tachypneic, significant Rales and  rhonchi at the bases, CT chest on admission significant for significant atelectasis bilaterally -Was encouraged to use incentive spirometry, and to ambulate -Has resolved, he is currently on room air even with ambulation, he was encouraged to take incentive spirometer with him and keep using it at home   AKI Hyperkalemia Metabolic acidosis: -Improving with IV fluids -Avoid nephrotoxic medications. -Hyperkalemia resolved with Lokelma -He was not to hold Celebrex and naproxen on discharge, and to delay started home medication including Aldactone, valsartan/hydrochlorothiazide for 4 days.   Acute burst fracture T12 : No trauma per history, non tender and no back pain.  Denies any complaints, started on calcium and vitamin D   Type 2 diabetes mellitus w/ hyperglycemia: -A1c is 7.8 -Resume home regimen on discharge including insulin and metformin. -Was instructed to stop Jardiance, and to avoid SGL 2 inhibitors in the future given significant UTI/pyelonephritis   Diarrhea -Resolved, negative C. difficile and GI panel,    Hypertension -Home medications including Diovan, Aldactone, hydrochlorothiazide especially in the setting of AKI and sepsis. -Resumed back on his Coreg and amlodipine during hospital stay, blood pressure stable at time of discharge, he was instructed to hold Aldactone, Diovan, hydrochlorothiazide for 4 days   Hyperlipidemia: Continue with statin   Peripheral artery disease w/ hx of stent in rt leg 1 year ago: PTA on plavix/asa-resume.   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     Discharge Diagnoses:  Principal Problem:   Acute renal failure (HCC) Active Problems:   HYPERCHOLESTEROLEMIA   Essential hypertension   Type 2 diabetes mellitus without complication, with long-term current use of insulin (HCC)   Peripheral arterial disease [facility])    Discharge Instructions  Discharge  Instructions     Diet - low sodium  heart healthy   Complete by: As directed    Discharge instructions   Complete by: As directed    Follow with Primary MD Ladora Daniel, PA-C in 7 days   Get CBC, CMP,  checked  by Primary MD next visit.    Activity: As tolerated with Full fall precautions use walker/cane & assistance as needed   Disposition Home    Diet: Heart Healthy / carb modified   On your next visit with your primary care physician please Get Medicines reviewed and adjusted.   Please request your Prim.MD to go over all Hospital Tests and Procedure/Radiological results at the follow up, please get all Hospital records sent to your Prim MD by signing hospital release before you go home.   If you experience worsening of your admission symptoms, develop shortness of breath, life threatening emergency, suicidal or homicidal thoughts you must seek medical attention immediately by calling 911 or calling your MD immediately  if symptoms less severe.  You Must read complete instructions/literature along with all the possible adverse reactions/side effects for all the Medicines you take and that have been prescribed to you. Take any new Medicines after you have completely understood and accpet all the possible adverse reactions/side effects.   Do not drive, operating heavy machinery, perform activities at heights, swimming or participation in water activities or provide baby sitting services if your were admitted for syncope or siezures until you have seen by Primary MD or a Neurologist and advised to do so again.  Do not drive when taking Pain medications.    Do not take more than prescribed Pain, Sleep and Anxiety Medications  Special Instructions: If you have smoked or chewed Tobacco  in the last 2 yrs please stop smoking, stop any regular Alcohol  and or any Recreational drug use.  Wear Seat belts while driving.   Please note  You were cared for by a hospitalist during your  hospital stay. If you have any questions about your discharge medications or the care you received while you were in the hospital after you are discharged, you can call the unit and asked to speak with the hospitalist on call if the hospitalist that took care of you is not available. Once you are discharged, your primary care physician will handle any further medical issues. Please note that NO REFILLS for any discharge medications will be authorized once you are discharged, as it is imperative that you return to your primary care physician (or establish a relationship with a primary care physician if you do not have one) for your aftercare needs so that they can reassess your need for medications and monitor your lab values.   Increase activity slowly   Complete by: As directed       Allergies as of 07/09/2023       Reactions   Latex Hives   Tramadol Rash   Codeine Other (See Comments)   constipation        Medication List     STOP taking these medications    celecoxib 200 MG capsule Commonly known as: CELEBREX   empagliflozin 25 MG Tabs tablet Commonly known as: JARDIANCE   naproxen sodium 220 MG tablet Commonly known as: ALEVE       TAKE these medications    acetaminophen 325 MG tablet Commonly known as: TYLENOL Take 2 tablets (650 mg total) by mouth every 6 (six) hours as needed for mild pain (pain score 1-3) (or Fever >/= 101). What changed:  medication strength how much to take reasons to take this   albuterol (2.5 MG/3ML) 0.083% nebulizer solution Commonly known as: PROVENTIL Take 2.5 mg by nebulization every 6 (six) hours as needed for wheezing or shortness of breath.   albuterol 108 (90 Base) MCG/ACT inhaler Commonly known as: VENTOLIN HFA Inhale 2 puffs into the lungs every 4 (four) hours as needed for shortness of breath or wheezing.   amLODipine 10 MG tablet Commonly known as: NORVASC Take 10 mg by mouth at bedtime.   amoxicillin 500 MG  tablet Commonly known as: AMOXIL Take 2 tablets (1,000 mg total) by mouth every 8 (eight) hours for 8 days.   aspirin EC 81 MG tablet Take 1 tablet (81 mg total) by mouth daily at 6 (six) AM. Swallow whole. What changed: when to take this   atorvastatin 20 MG tablet Commonly known as: LIPITOR Take 20 mg by mouth 3 (three) times a week. In the evening   calcium-vitamin D 500-5 MG-MCG tablet Commonly known as: OSCAL WITH D Take 1 tablet by mouth 3 (three) times daily.   carvedilol 12.5 MG tablet Commonly known as: COREG TAKE 1 TABLET TWICE DAILY WITH A MEAL (CONTACT OFFICE AND MAKE APPOINTMENT OR GET REFILL FROM PCP)   clopidogrel 75 MG tablet Commonly known as: PLAVIX TAKE 1 TABLET EVERY DAY  AT  6AM   cyanocobalamin 1000 MCG tablet Commonly known as: VITAMIN B12 Take 1,000 mcg by mouth every evening.   fenofibrate 54 MG tablet Take 54 mg by mouth daily.   finasteride 5 MG tablet Commonly known as: PROSCAR Take 1 tablet (5 mg total) by mouth daily. What changed: when to take this   HumaLOG Mix 75/25 KwikPen (75-25) 100 UNIT/ML KwikPen Generic drug: Insulin Lispro Prot & Lispro Inject 15-20 Units into the skin See admin instructions. Inject 20 units into the skin in the morning and 15 units in the evening.   metFORMIN 1000 MG tablet Commonly known as: GLUCOPHAGE Take 1,000 mg by mouth 2 (two) times daily.   multivitamin tablet Take 1 tablet by mouth every evening.   omeprazole 40 MG capsule Commonly known as: PRILOSEC Take 40 mg by mouth 2 (two) times daily.   Red Yeast Rice 600 MG Caps Take 600 mg by mouth every evening.   spironolactone 25 MG tablet Commonly known as: ALDACTONE Take 1 tablet (25 mg total) by mouth daily. Start taking on: July 12, 2023 What changed:  See the new instructions. These instructions start on July 12, 2023. If you are unsure what to do until then, ask your doctor or other care provider.   tamsulosin 0.4 MG Caps  capsule Commonly known as: FLOMAX Take 0.4 mg by mouth 2 (two) times daily.   valsartan-hydrochlorothiazide 320-25 MG tablet Commonly known as: Diovan HCT Take 0.5 tablets by mouth daily. Start taking on: July 13, 2023 What changed: These instructions start on July 13, 2023. If you are unsure what to do until then, ask your doctor or other care provider.   Vitamin D3 50 MCG (2000 UT) Tabs Take 2,000 Units by mouth every evening.        Allergies  Allergen Reactions   Latex Hives   Tramadol Rash   Codeine Other (See Comments)    constipation    Consultations: None   Procedures/Studies: DG Chest Port 1 View Result Date: 07/05/2023 CLINICAL DATA:  Hypoxia EXAM: PORTABLE CHEST 1 VIEW COMPARISON:  Chest x-ray 07/04/2023 FINDINGS: The heart size and mediastinal contours are  within normal limits. There are linear bands of atelectasis in the left lung base. There is a questionable nodular density in the left lower lobe retrocardiac region measuring 7 mm. The lungs are otherwise clear. There is no pleural effusion or pneumothorax. The cardiomediastinal silhouette is within normal limits. No acute fractures are seen. IMPRESSION: 1. Linear bands of atelectasis in the left lung base. 2. Questionable nodular density in the left lower lobe retrocardiac region measuring 7 mm. Recommend further evaluation with chest CT. Electronically Signed   By: Darliss Cheney M.D.   On: 07/05/2023 21:35   CT CHEST ABDOMEN PELVIS WO CONTRAST Result Date: 07/04/2023 CLINICAL DATA:  Sepsis LLQ pain, SOB, +SIRS, PNA vs intraabd infx EXAM: CT CHEST, ABDOMEN AND PELVIS WITHOUT CONTRAST TECHNIQUE: Multidetector CT imaging of the chest, abdomen and pelvis was performed following the standard protocol without IV contrast. RADIATION DOSE REDUCTION: This exam was performed according to the departmental dose-optimization program which includes automated exposure control, adjustment of the mA and/or kV according to patient  size and/or use of iterative reconstruction technique. COMPARISON:  Chest radiograph same date.  Abdominal CTA 03/28/2021. FINDINGS: CT CHEST FINDINGS Cardiovascular: Atherosclerosis of the aorta, great vessels and coronary arteries. The heart size is normal. There is no pericardial effusion. Mediastinum/Nodes: There are no enlarged mediastinal, hilar or axillary lymph nodes. The thyroid gland, trachea and esophagus demonstrate no significant findings. Lungs/Pleura: No pleural effusion or pneumothorax. New dependent atelectasis at both lung bases. No confluent airspace disease or suspicious pulmonary nodularity. Musculoskeletal/Chest wall: There is an acute burst fracture at T12 resulting in 4 mm of osseous retropulsion and approximately 60% loss of vertebral body height. No other acute fractures are seen in the chest. CT ABDOMEN AND PELVIS FINDINGS Hepatobiliary: The liver demonstrates diffusely decreased density consistent with steatosis. No focal or acute abnormalities are identified on noncontrast imaging. No evidence of gallstones, gallbladder wall thickening or biliary dilatation. Pancreas: Diffuse pancreatic atrophy. No ductal dilatation or surrounding inflammation. Spleen: Normal in size without focal abnormality. Adrenals/Urinary Tract: Both adrenal glands appear normal. No evidence of urinary tract calculus. There is new asymmetric perinephric soft tissue stranding on the left with air in the left intrarenal collecting system and bladder. In addition, there is a small amount of retroperitoneal soft tissue emphysema along the inferior medial aspect of the left kidney. There is no drainable fluid collection. A 3.5 cm cyst in the interpolar region of the right kidney is grossly stable, for which no specific follow-up imaging is recommended. No significant ureteral dilatation. Small amount of air in the bladder lumen. No significant bladder wall thickening or surrounding inflammation. Stomach/Bowel: No enteric  contrast administered. The stomach appears unremarkable for its degree of distension. No evidence of bowel wall thickening, distention or surrounding inflammatory change. Vascular/Lymphatic: There are no enlarged abdominal or pelvic lymph nodes. Aortic and branch vessel atherosclerosis without evidence of aneurysm. Severe involvement of the proximal left femoral artery. Reproductive: The prostate gland and seminal vesicles appear unremarkable. Other: Stable small umbilical and bilateral inguinal hernias containing only fat. No ascites or pneumoperitoneum. Musculoskeletal: No acute or significant osseous findings. Specifically, no acute fractures are identified within the abdomen or pelvis. Lumbar spondylosis noted. IMPRESSION: 1. Acute burst fracture at T12 with 4 mm of osseous retropulsion and approximately 60% loss of vertebral body height. 2. New asymmetric perinephric soft tissue stranding on the left with air in the left intrarenal collecting system and bladder. In addition, there is a small amount of retroperitoneal soft tissue  emphysema along the inferior medial aspect of the left kidney. In the absence of recent instrumentation, findings are suspicious for emphysematous pyelonephritis. No drainable fluid collection. 3. No other acute findings identified in the chest, abdomen or pelvis. 4. Hepatic steatosis. 5.  Aortic Atherosclerosis (ICD10-I70.0). Electronically Signed   By: Carey Bullocks M.D.   On: 07/04/2023 14:18   DG Chest Port 1 View Result Date: 07/04/2023 CLINICAL DATA:  Sepsis EXAM: PORTABLE CHEST 1 VIEW COMPARISON:  Chest radiograph dated 11/19/2009 FINDINGS: Normal lung volumes. Bibasilar patchy and linear opacities, left-greater-than-right. No pleural effusion or pneumothorax. The heart size and mediastinal contours are within normal limits. No acute osseous abnormality. IMPRESSION: Bibasilar patchy and linear opacities, left-greater-than-right, likely atelectasis. Aspiration or pneumonia can  be considered in the appropriate clinical setting. Electronically Signed   By: Agustin Cree M.D.   On: 07/04/2023 08:37     Subjective: Patient reports he is feeling much better today, no flank pain, ambulated in the hallway yesterday with no hypoxia, no dyspnea, reports good appetite, no urinary symptoms, eager to go home today  Discharge Exam: Vitals:   07/09/23 0415 07/09/23 0759  BP: 137/61 (!) 139/55  Pulse: 78 75  Resp: 19 16  Temp: 98 F (36.7 C) 97.7 F (36.5 C)  SpO2: 94% 94%   Vitals:   07/08/23 2055 07/08/23 2306 07/09/23 0415 07/09/23 0759  BP:  (!) 132/53 137/61 (!) 139/55  Pulse:  71 78 75  Resp:  16 19 16   Temp:  98.2 F (36.8 C) 98 F (36.7 C) 97.7 F (36.5 C)  TempSrc:  Oral Oral Oral  SpO2: 92% 95% 94% 94%  Weight:      Height:        General: Pt is alert, awake, not in acute distress Cardiovascular: RRR, S1/S2 +, no rubs, no gallops Respiratory: CTA bilaterally, no wheezing, no rhonchi Abdominal: Soft, NT, ND, bowel sounds + Extremities: no edema, no cyanosis    The results of significant diagnostics from this hospitalization (including imaging, microbiology, ancillary and laboratory) are listed below for reference.     Microbiology: Recent Results (from the past 240 hours)  Blood Culture (routine x 2)     Status: Abnormal   Collection Time: 07/04/23  7:54 AM   Specimen: BLOOD LEFT ARM  Result Value Ref Range Status   Specimen Description BLOOD LEFT ARM  Final   Special Requests   Final    BOTTLES DRAWN AEROBIC AND ANAEROBIC Blood Culture results may not be optimal due to an inadequate volume of blood received in culture bottles   Culture  Setup Time   Final    GRAM NEGATIVE RODS IN BOTH AEROBIC AND ANAEROBIC BOTTLES CRITICAL VALUE NOTED.  VALUE IS CONSISTENT WITH PREVIOUSLY REPORTED AND CALLED VALUE.    Culture (A)  Final    ESCHERICHIA COLI SUSCEPTIBILITIES PERFORMED ON PREVIOUS CULTURE WITHIN THE LAST 5 DAYS. Performed at [facility], [address], [place], [place] [address]    Report Status 07/07/2023 FINAL  Final  Blood Culture (routine x 2)     Status: Abnormal   Collection Time: 07/04/23  7:54 AM   Specimen: BLOOD RIGHT ARM  Result Value Ref Range Status   Specimen Description BLOOD RIGHT ARM  Final   Special Requests   Final    BOTTLES DRAWN AEROBIC AND ANAEROBIC Blood Culture results may not be optimal due to an inadequate volume of blood received in culture bottles   Culture  Setup  Time   Final    GRAM NEGATIVE RODS IN BOTH AEROBIC AND ANAEROBIC BOTTLES CRITICAL RESULT CALLED TO, READ BACK BY AND VERIFIED WITH: Mercer Pod [id number] @ 2334 FH Performed at [facility], [address] [place], [place] [address]    Culture ESCHERICHIA COLI (A)  Final   Report Status 07/06/2023 FINAL  Final   Organism ID, Bacteria ESCHERICHIA COLI  Final   Organism ID, Bacteria ESCHERICHIA COLI  Final      Susceptibility   Escherichia coli - KIRBY BAUER*    CEFAZOLIN SENSITIVE Sensitive    Escherichia coli - MIC*    AMPICILLIN 8 SENSITIVE Sensitive     CEFEPIME <=0.12 SENSITIVE Sensitive     CEFTAZIDIME <=1 SENSITIVE Sensitive     CEFTRIAXONE <=0.25 SENSITIVE Sensitive     CIPROFLOXACIN <=0.25 SENSITIVE Sensitive     GENTAMICIN <=1 SENSITIVE Sensitive     IMIPENEM <=0.25 SENSITIVE Sensitive     TRIMETH/SULFA <=20 SENSITIVE Sensitive     AMPICILLIN/SULBACTAM <=2 SENSITIVE Sensitive     PIP/TAZO <=4 SENSITIVE Sensitive ug/mL    * ESCHERICHIA COLI    ESCHERICHIA COLI  Blood Culture ID Panel (Reflexed)     Status: Abnormal   Collection Time: 07/04/23  7:54 AM  Result Value Ref Range Status   Enterococcus faecalis NOT DETECTED NOT DETECTED Final   Enterococcus Faecium NOT DETECTED NOT DETECTED Final   Listeria monocytogenes NOT DETECTED NOT DETECTED Final   Staphylococcus species NOT DETECTED NOT DETECTED Final   Staphylococcus aureus (BCID) NOT DETECTED NOT DETECTED Final   Staphylococcus epidermidis  NOT DETECTED NOT DETECTED Final   Staphylococcus lugdunensis NOT DETECTED NOT DETECTED Final   Streptococcus species NOT DETECTED NOT DETECTED Final   Streptococcus agalactiae NOT DETECTED NOT DETECTED Final   Streptococcus pneumoniae NOT DETECTED NOT DETECTED Final   Streptococcus pyogenes NOT DETECTED NOT DETECTED Final   A.calcoaceticus-baumannii NOT DETECTED NOT DETECTED Final   Bacteroides fragilis NOT DETECTED NOT DETECTED Final   Enterobacterales DETECTED (A) NOT DETECTED Final    Comment: Enterobacterales represent a large order of gram negative bacteria, not a single organism. CRITICAL RESULT CALLED TO, READ BACK BY AND VERIFIED WITH: PHARMD V.BRYK [id number] @ 2334 FH    Enterobacter cloacae complex NOT DETECTED NOT DETECTED Final   Escherichia coli DETECTED (A) NOT DETECTED Final    Comment: CRITICAL RESULT CALLED TO, READ BACK BY AND VERIFIED WITH: PHARMD V.BRYK [id number] @ 2334 FH    Klebsiella aerogenes NOT DETECTED NOT DETECTED Final   Klebsiella oxytoca NOT DETECTED NOT DETECTED Final   Klebsiella pneumoniae NOT DETECTED NOT DETECTED Final   Proteus species NOT DETECTED NOT DETECTED Final   Salmonella species NOT DETECTED NOT DETECTED Final   Serratia marcescens NOT DETECTED NOT DETECTED Final   Haemophilus influenzae NOT DETECTED NOT DETECTED Final   Neisseria meningitidis NOT DETECTED NOT DETECTED Final   Pseudomonas aeruginosa NOT DETECTED NOT DETECTED Final   Stenotrophomonas maltophilia NOT DETECTED NOT DETECTED Final   Candida albicans NOT DETECTED NOT DETECTED Final   Candida auris NOT DETECTED NOT DETECTED Final   Candida glabrata NOT DETECTED NOT DETECTED Final   Candida krusei NOT DETECTED NOT DETECTED Final   Candida parapsilosis NOT DETECTED NOT DETECTED Final   Candida tropicalis NOT DETECTED NOT DETECTED Final   Cryptococcus neoformans/gattii NOT DETECTED NOT DETECTED Final   CTX-M ESBL NOT DETECTED NOT DETECTED Final   Carbapenem resistance IMP NOT DETECTED  NOT DETECTED Final   Carbapenem resistance  KPC NOT DETECTED NOT DETECTED Final   Carbapenem resistance NDM NOT DETECTED NOT DETECTED Final   Carbapenem resist OXA 48 LIKE NOT DETECTED NOT DETECTED Final   Carbapenem resistance VIM NOT DETECTED NOT DETECTED Final    Comment: Performed at [facility], [address] [place], [place] [address]  Resp panel by RT-PCR (RSV, Flu A&B, Covid) Anterior Nasal Swab     Status: None   Collection Time: 07/04/23  8:03 AM   Specimen: Anterior Nasal Swab  Result Value Ref Range Status   SARS Coronavirus 2 by RT PCR NEGATIVE NEGATIVE Final   Influenza A by PCR NEGATIVE NEGATIVE Final   Influenza B by PCR NEGATIVE NEGATIVE Final    Comment: (NOTE) The Xpert Xpress SARS-CoV-2/FLU/RSV plus assay is intended as an aid in the diagnosis of influenza from Nasopharyngeal swab specimens and should not be used as a sole basis for treatment. Nasal washings and aspirates are unacceptable for Xpert Xpress SARS-CoV-2/FLU/RSV testing.  Fact Sheet for Patients: BloggerCourse.com  Fact Sheet for Healthcare Providers: SeriousBroker.it  This test is not yet approved or cleared by the [place] FDA and has been authorized for detection and/or diagnosis of SARS-CoV-2 by FDA under an Emergency Use Authorization (EUA). This EUA will remain in effect (meaning this test can be used) for the duration of the COVID-19 declaration under Section 564(b)(1) of the Act, 21 U.S.C. section 360bbb-3(b)(1), unless the authorization is terminated or revoked.     Resp Syncytial Virus by PCR NEGATIVE NEGATIVE Final    Comment: (NOTE) Fact Sheet for Patients: BloggerCourse.com  Fact Sheet for Healthcare Providers: SeriousBroker.it  This test is not yet approved or cleared by the [place] FDA and has been authorized for detection and/or diagnosis of SARS-CoV-2 by FDA  under an Emergency Use Authorization (EUA). This EUA will remain in effect (meaning this test can be used) for the duration of the COVID-19 declaration under Section 564(b)(1) of the Act, 21 U.S.C. section 360bbb-3(b)(1), unless the authorization is terminated or revoked.  Performed at [facility], [address], [place], [place] [address]   Urine Culture     Status: Abnormal   Collection Time: 07/04/23  2:48 PM   Specimen: Urine, Random  Result Value Ref Range Status   Specimen Description URINE, RANDOM  Final   Special Requests NONE Reflexed from [id number]  Final   Culture (A)  Final    40,000 COLONIES/mL ENTEROCOCCUS FAECALIS 10,000 COLONIES/mL LACTOBACILLUS SPECIES Standardized susceptibility testing for this organism is not available. Performed at [facility], [address], [place], [place] [address]    Report Status 07/07/2023 FINAL  Final   Organism ID, Bacteria ENTEROCOCCUS FAECALIS (A)  Final      Susceptibility   Enterococcus faecalis - MIC*    AMPICILLIN <=2 SENSITIVE Sensitive     NITROFURANTOIN <=16 SENSITIVE Sensitive     VANCOMYCIN 1 SENSITIVE Sensitive     * 40,000 COLONIES/mL ENTEROCOCCUS FAECALIS  C Difficile Quick Screen w PCR reflex     Status: None   Collection Time: 07/05/23  7:42 AM   Specimen: STOOL  Result Value Ref Range Status   C Diff antigen NEGATIVE NEGATIVE Final   C Diff toxin NEGATIVE NEGATIVE Final   C Diff interpretation No C. difficile detected.  Final    Comment: Performed at [facility], [address], [place], [place] [address]  Gastrointestinal Panel by PCR , Stool     Status: None  Collection Time: 07/05/23 12:11 PM   Specimen: Stool  Result Value Ref Range Status   Campylobacter species NOT DETECTED NOT DETECTED Final   Plesimonas shigelloides NOT DETECTED NOT DETECTED Final   Salmonella species NOT DETECTED NOT DETECTED Final   Yersinia enterocolitica NOT DETECTED NOT DETECTED Final   Vibrio species NOT  DETECTED NOT DETECTED Final   Vibrio cholerae NOT DETECTED NOT DETECTED Final   Enteroaggregative E coli (EAEC) NOT DETECTED NOT DETECTED Final   Enteropathogenic E coli (EPEC) NOT DETECTED NOT DETECTED Final   Enterotoxigenic E coli (ETEC) NOT DETECTED NOT DETECTED Final   Shiga like toxin producing E coli (STEC) NOT DETECTED NOT DETECTED Final   Shigella/Enteroinvasive E coli (EIEC) NOT DETECTED NOT DETECTED Final   Cryptosporidium NOT DETECTED NOT DETECTED Final   Cyclospora cayetanensis NOT DETECTED NOT DETECTED Final   Entamoeba histolytica NOT DETECTED NOT DETECTED Final   Giardia lamblia NOT DETECTED NOT DETECTED Final   Adenovirus F40/41 NOT DETECTED NOT DETECTED Final   Astrovirus NOT DETECTED NOT DETECTED Final   Norovirus GI/GII NOT DETECTED NOT DETECTED Final   Rotavirus A NOT DETECTED NOT DETECTED Final   Sapovirus (I, II, IV, and V) NOT DETECTED NOT DETECTED Final    Comment: Performed at [facility], [address] [place], [place] [address]  Culture, blood (single) w Reflex to ID Panel     Status: None (Preliminary result)   Collection Time: 07/06/23  7:32 AM   Specimen: BLOOD RIGHT HAND  Result Value Ref Range Status   Specimen Description BLOOD RIGHT HAND  Final   Special Requests   Final    BOTTLES DRAWN AEROBIC ONLY Blood Culture results may not be optimal due to an inadequate volume of blood received in culture bottles   Culture   Final    NO GROWTH 3 DAYS Performed at [facility], [address] [place], [place] [address]    Report Status PENDING  Incomplete     Labs: BNP (last 3 results) Recent Labs    07/06/23 0527 07/07/23 0526 07/08/23 0435  BNP 299.0* 329.7* 174.9*   Basic Metabolic Panel: Recent Labs  Lab 07/05/23 0522 07/06/23 0527 07/07/23 0526 07/08/23 0435 07/09/23 0430  NA 138 138 139 137 134*  K 4.9 4.3 4.3 3.8 3.6  CL 110 105 104 99 100  CO2 19* 23 27 28 28   GLUCOSE 135* 178* 127* 150* 188*  BUN 38* 34* 30*  31* 29*  CREATININE 1.94* 1.68* 1.34* 1.32* 1.28*  CALCIUM 8.0* 8.8* 8.8* 8.9 8.0*  MG  --  1.7  --   --   --   PHOS  --  2.7  --   --   --    Liver Function Tests: Recent Labs  Lab 07/04/23 1023 07/05/23 0522  AST 36 38  ALT 29 30  ALKPHOS 52 82  BILITOT 0.5 0.5  PROT 5.8* 6.2*  ALBUMIN 2.9* 2.9*   Recent Labs  Lab 07/04/23 1023  LIPASE 16   No results for input(s): "AMMONIA" in the last 168 hours. CBC: Recent Labs  Lab 07/04/23 0754 07/05/23 0522 07/06/23 0527 07/07/23 0526 07/08/23 0435 07/09/23 0430  WBC 20.6* 19.4* 19.6* 12.1* 8.4 8.3  NEUTROABS 17.9*  --   --   --   --   --   HGB 13.4 12.7* 12.3* 12.1* 12.7* 12.7*  HCT 41.3 38.7* 37.8* 37.4* 38.6* 37.8*  MCV 87.9 86.6 85.5 86.0 85.8 84.8  PLT 173  147* 139* 119* 145* 176   Cardiac Enzymes: No results for input(s): "CKTOTAL", "CKMB", "CKMBINDEX", "TROPONINI" in the last 168 hours. BNP: Invalid input(s): "POCBNP" CBG: Recent Labs  Lab 07/08/23 1940 07/08/23 1956 07/08/23 2307 07/09/23 0412 07/09/23 0754  GLUCAP 292* 259* 150* 183* 187*   D-Dimer No results for input(s): "DDIMER" in the last 72 hours. Hgb A1c No results for input(s): "HGBA1C" in the last 72 hours. Lipid Profile No results for input(s): "CHOL", "HDL", "LDLCALC", "TRIG", "CHOLHDL", "LDLDIRECT" in the last 72 hours. Thyroid function studies No results for input(s): "TSH", "T4TOTAL", "T3FREE", "THYROIDAB" in the last 72 hours.  Invalid input(s): "FREET3" Anemia work up No results for input(s): "VITAMINB12", "FOLATE", "FERRITIN", "TIBC", "IRON", "RETICCTPCT" in the last 72 hours. Urinalysis    Component Value Date/Time   COLORURINE YELLOW 07/04/2023 1448   APPEARANCEUR CLOUDY (A) 07/04/2023 1448   LABSPEC 1.018 07/04/2023 1448   PHURINE 5.0 07/04/2023 1448   GLUCOSEU >=500 (A) 07/04/2023 1448   HGBUR LARGE (A) 07/04/2023 1448   BILIRUBINUR NEGATIVE 07/04/2023 1448   KETONESUR 5 (A) 07/04/2023 1448   PROTEINUR 100 (A)  07/04/2023 1448   UROBILINOGEN 0.2 01/08/2015 1700   NITRITE NEGATIVE 07/04/2023 1448   LEUKOCYTESUR MODERATE (A) 07/04/2023 1448   Sepsis Labs Recent Labs  Lab 07/06/23 0527 07/07/23 0526 07/08/23 0435 07/09/23 0430  WBC 19.6* 12.1* 8.4 8.3   Microbiology Recent Results (from the past 240 hours)  Blood Culture (routine x 2)     Status: Abnormal   Collection Time: 07/04/23  7:54 AM   Specimen: BLOOD LEFT ARM  Result Value Ref Range Status   Specimen Description BLOOD LEFT ARM  Final   Special Requests   Final    BOTTLES DRAWN AEROBIC AND ANAEROBIC Blood Culture results may not be optimal due to an inadequate volume of blood received in culture bottles   Culture  Setup Time   Final    GRAM NEGATIVE RODS IN BOTH AEROBIC AND ANAEROBIC BOTTLES CRITICAL VALUE NOTED.  VALUE IS CONSISTENT WITH PREVIOUSLY REPORTED AND CALLED VALUE.    Culture (A)  Final    ESCHERICHIA COLI SUSCEPTIBILITIES PERFORMED ON PREVIOUS CULTURE WITHIN THE LAST 5 DAYS. Performed at [facility], [address], [place], [place] [address]    Report Status 07/07/2023 FINAL  Final  Blood Culture (routine x 2)     Status: Abnormal   Collection Time: 07/04/23  7:54 AM   Specimen: BLOOD RIGHT ARM  Result Value Ref Range Status   Specimen Description BLOOD RIGHT ARM  Final   Special Requests   Final    BOTTLES DRAWN AEROBIC AND ANAEROBIC Blood Culture results may not be optimal due to an inadequate volume of blood received in culture bottles   Culture  Setup Time   Final    GRAM NEGATIVE RODS IN BOTH AEROBIC AND ANAEROBIC BOTTLES CRITICAL RESULT CALLED TO, READ BACK BY AND VERIFIED WITH: Mercer Pod [id number] @ 2334 FH Performed at [facility], [address] [place], [place] [address]    Culture ESCHERICHIA COLI (A)  Final   Report Status 07/06/2023 FINAL  Final   Organism ID, Bacteria ESCHERICHIA COLI  Final   Organism ID, Bacteria ESCHERICHIA COLI  Final      Susceptibility   Escherichia  coli - KIRBY BAUER*    CEFAZOLIN SENSITIVE Sensitive    Escherichia coli - MIC*    AMPICILLIN 8 SENSITIVE Sensitive     CEFEPIME <=0.12 SENSITIVE Sensitive  CEFTAZIDIME <=1 SENSITIVE Sensitive     CEFTRIAXONE <=0.25 SENSITIVE Sensitive     CIPROFLOXACIN <=0.25 SENSITIVE Sensitive     GENTAMICIN <=1 SENSITIVE Sensitive     IMIPENEM <=0.25 SENSITIVE Sensitive     TRIMETH/SULFA <=20 SENSITIVE Sensitive     AMPICILLIN/SULBACTAM <=2 SENSITIVE Sensitive     PIP/TAZO <=4 SENSITIVE Sensitive ug/mL    * ESCHERICHIA COLI    ESCHERICHIA COLI  Blood Culture ID Panel (Reflexed)     Status: Abnormal   Collection Time: 07/04/23  7:54 AM  Result Value Ref Range Status   Enterococcus faecalis NOT DETECTED NOT DETECTED Final   Enterococcus Faecium NOT DETECTED NOT DETECTED Final   Listeria monocytogenes NOT DETECTED NOT DETECTED Final   Staphylococcus species NOT DETECTED NOT DETECTED Final   Staphylococcus aureus (BCID) NOT DETECTED NOT DETECTED Final   Staphylococcus epidermidis NOT DETECTED NOT DETECTED Final   Staphylococcus lugdunensis NOT DETECTED NOT DETECTED Final   Streptococcus species NOT DETECTED NOT DETECTED Final   Streptococcus agalactiae NOT DETECTED NOT DETECTED Final   Streptococcus pneumoniae NOT DETECTED NOT DETECTED Final   Streptococcus pyogenes NOT DETECTED NOT DETECTED Final   A.calcoaceticus-baumannii NOT DETECTED NOT DETECTED Final   Bacteroides fragilis NOT DETECTED NOT DETECTED Final   Enterobacterales DETECTED (A) NOT DETECTED Final    Comment: Enterobacterales represent a large order of gram negative bacteria, not a single organism. CRITICAL RESULT CALLED TO, READ BACK BY AND VERIFIED WITH: PHARMD V.BRYK [id number] @ 2334 FH    Enterobacter cloacae complex NOT DETECTED NOT DETECTED Final   Escherichia coli DETECTED (A) NOT DETECTED Final    Comment: CRITICAL RESULT CALLED TO, READ BACK BY AND VERIFIED WITH: PHARMD V.BRYK [id number] @ 2334 FH    Klebsiella aerogenes NOT  DETECTED NOT DETECTED Final   Klebsiella oxytoca NOT DETECTED NOT DETECTED Final   Klebsiella pneumoniae NOT DETECTED NOT DETECTED Final   Proteus species NOT DETECTED NOT DETECTED Final   Salmonella species NOT DETECTED NOT DETECTED Final   Serratia marcescens NOT DETECTED NOT DETECTED Final   Haemophilus influenzae NOT DETECTED NOT DETECTED Final   Neisseria meningitidis NOT DETECTED NOT DETECTED Final   Pseudomonas aeruginosa NOT DETECTED NOT DETECTED Final   Stenotrophomonas maltophilia NOT DETECTED NOT DETECTED Final   Candida albicans NOT DETECTED NOT DETECTED Final   Candida auris NOT DETECTED NOT DETECTED Final   Candida glabrata NOT DETECTED NOT DETECTED Final   Candida krusei NOT DETECTED NOT DETECTED Final   Candida parapsilosis NOT DETECTED NOT DETECTED Final   Candida tropicalis NOT DETECTED NOT DETECTED Final   Cryptococcus neoformans/gattii NOT DETECTED NOT DETECTED Final   CTX-M ESBL NOT DETECTED NOT DETECTED Final   Carbapenem resistance IMP NOT DETECTED NOT DETECTED Final   Carbapenem resistance KPC NOT DETECTED NOT DETECTED Final   Carbapenem resistance NDM NOT DETECTED NOT DETECTED Final   Carbapenem resist OXA 48 LIKE NOT DETECTED NOT DETECTED Final   Carbapenem resistance VIM NOT DETECTED NOT DETECTED Final    Comment: Performed at [facility], [address] [place], [place] [address]  Resp panel by RT-PCR (RSV, Flu A&B, Covid) Anterior Nasal Swab     Status: None   Collection Time: 07/04/23  8:03 AM   Specimen: Anterior Nasal Swab  Result Value Ref Range Status   SARS Coronavirus 2 by RT PCR NEGATIVE NEGATIVE Final   Influenza A by PCR NEGATIVE NEGATIVE Final   Influenza B by PCR NEGATIVE NEGATIVE Final    Comment: (  NOTE) The Xpert Xpress SARS-CoV-2/FLU/RSV plus assay is intended as an aid in the diagnosis of influenza from Nasopharyngeal swab specimens and should not be used as a sole basis for treatment. Nasal washings and aspirates are  unacceptable for Xpert Xpress SARS-CoV-2/FLU/RSV testing.  Fact Sheet for Patients: BloggerCourse.com  Fact Sheet for Healthcare Providers: SeriousBroker.it  This test is not yet approved or cleared by the [place] FDA and has been authorized for detection and/or diagnosis of SARS-CoV-2 by FDA under an Emergency Use Authorization (EUA). This EUA will remain in effect (meaning this test can be used) for the duration of the COVID-19 declaration under Section 564(b)(1) of the Act, 21 U.S.C. section 360bbb-3(b)(1), unless the authorization is terminated or revoked.     Resp Syncytial Virus by PCR NEGATIVE NEGATIVE Final    Comment: (NOTE) Fact Sheet for Patients: BloggerCourse.com  Fact Sheet for Healthcare Providers: SeriousBroker.it  This test is not yet approved or cleared by the [place] FDA and has been authorized for detection and/or diagnosis of SARS-CoV-2 by FDA under an Emergency Use Authorization (EUA). This EUA will remain in effect (meaning this test can be used) for the duration of the COVID-19 declaration under Section 564(b)(1) of the Act, 21 U.S.C. section 360bbb-3(b)(1), unless the authorization is terminated or revoked.  Performed at [facility], [address] [place], [place] [address]   Urine Culture     Status: Abnormal   Collection Time: 07/04/23  2:48 PM   Specimen: Urine, Random  Result Value Ref Range Status   Specimen Description URINE, RANDOM  Final   Special Requests NONE Reflexed from [id number]  Final   Culture (A)  Final    40,000 COLONIES/mL ENTEROCOCCUS FAECALIS 10,000 COLONIES/mL LACTOBACILLUS SPECIES Standardized susceptibility testing for this organism is not available. Performed at [facility], [address], [place], [place] [address]    Report Status 07/07/2023 FINAL  Final   Organism ID, Bacteria ENTEROCOCCUS  FAECALIS (A)  Final      Susceptibility   Enterococcus faecalis - MIC*    AMPICILLIN <=2 SENSITIVE Sensitive     NITROFURANTOIN <=16 SENSITIVE Sensitive     VANCOMYCIN 1 SENSITIVE Sensitive     * 40,000 COLONIES/mL ENTEROCOCCUS FAECALIS  C Difficile Quick Screen w PCR reflex     Status: None   Collection Time: 07/05/23  7:42 AM   Specimen: STOOL  Result Value Ref Range Status   C Diff antigen NEGATIVE NEGATIVE Final   C Diff toxin NEGATIVE NEGATIVE Final   C Diff interpretation No C. difficile detected.  Final    Comment: Performed at [facility], [address] [place], [place] [address]  Gastrointestinal Panel by PCR , Stool     Status: None   Collection Time: 07/05/23 12:11 PM   Specimen: Stool  Result Value Ref Range Status   Campylobacter species NOT DETECTED NOT DETECTED Final   Plesimonas shigelloides NOT DETECTED NOT DETECTED Final   Salmonella species NOT DETECTED NOT DETECTED Final   Yersinia enterocolitica NOT DETECTED NOT DETECTED Final   Vibrio species NOT DETECTED NOT DETECTED Final   Vibrio cholerae NOT DETECTED NOT DETECTED Final   Enteroaggregative E coli (EAEC) NOT DETECTED NOT DETECTED Final   Enteropathogenic E coli (EPEC) NOT DETECTED NOT DETECTED Final   Enterotoxigenic E coli (ETEC) NOT DETECTED NOT DETECTED Final   Shiga like toxin producing E coli (STEC) NOT DETECTED NOT DETECTED Final   Shigella/Enteroinvasive E coli (EIEC) NOT  DETECTED NOT DETECTED Final   Cryptosporidium NOT DETECTED NOT DETECTED Final   Cyclospora cayetanensis NOT DETECTED NOT DETECTED Final   Entamoeba histolytica NOT DETECTED NOT DETECTED Final   Giardia lamblia NOT DETECTED NOT DETECTED Final   Adenovirus F40/41 NOT DETECTED NOT DETECTED Final   Astrovirus NOT DETECTED NOT DETECTED Final   Norovirus GI/GII NOT DETECTED NOT DETECTED Final   Rotavirus A NOT DETECTED NOT DETECTED Final   Sapovirus (I, II, IV, and V) NOT DETECTED NOT DETECTED Final    Comment: Performed at  [facility], [address] [place], [place] [address]  Culture, blood (single) w Reflex to ID Panel     Status: None (Preliminary result)   Collection Time: 07/06/23  7:32 AM   Specimen: BLOOD RIGHT HAND  Result Value Ref Range Status   Specimen Description BLOOD RIGHT HAND  Final   Special Requests   Final    BOTTLES DRAWN AEROBIC ONLY Blood Culture results may not be optimal due to an inadequate volume of blood received in culture bottles   Culture   Final    NO GROWTH 3 DAYS Performed at [facility], [address] [place], [place] [address]    Report Status PENDING  Incomplete     Time coordinating discharge: Over 30 minutes  SIGNED:   Huey Bienenstock, MD  [facility] 07/09/2023, 9:37 AM Pager   If 7PM-7AM, please contact night-coverage [email or web address] Password [email or web address]

## 2023-07-09 NOTE — Progress Notes (Signed)
 DISCHARGE NOTE HOME Todd Krause to be discharged Home per MD order. Discussed prescriptions and follow up appointments with the patient. Prescriptions given to patient; medication list explained in detail. Patient verbalized understanding.  Skin clean, dry and intact without evidence of skin break down, no evidence of skin tears noted. IV catheter discontinued intact. Site without signs and symptoms of complications. Dressing and pressure applied. Pt denies pain at the site currently. No complaints noted.  Patient free of lines, drains, and wounds.   An After Visit Summary (AVS) was printed and given to the patient.  Patient to be taken to Discharge lounge to await ride. Patient will be escorted via wheelchair, and discharged home via private auto.  [name], RN

## 2023-07-09 NOTE — Progress Notes (Signed)
   07/09/23 0925  Mobility  Activity Ambulated independently in hallway  Level of Assistance Standby assist, set-up cues, supervision of patient - no hands on  Assistive Device None  Distance Ambulated (ft) 475 ft  Range of Motion/Exercises All extremities  Activity Response Tolerated fair  Mobility Referral Yes  Mobility visit 1 Mobility  Mobility Specialist Start Time (ACUTE ONLY) 0915  Mobility Specialist Stop Time (ACUTE ONLY) 0925  Mobility Specialist Time Calculation (min) (ACUTE ONLY) 10 min   Mobility Specialist: Progress Note  Pre-Mobility:      HR 87, SpO2 92% RA During Mobility: HR 117, SpO2 88-91% RA Post-Mobility:    HR 88, SpO2 93%  RA  Pt agreeable to mobility session - received in chair.Pt without nasal cannula on Room Air, left on Room Air - RN notified. Pt was asymptomatic throughout session with no complaints. Desat to 88%% RA, SPO2 recovery to 91 with PLB and one standing break.  Returned to chair with all needs met - call bell within reach.   Barnie Mort, BS Mobility Specialist Please contact via SecureChat or  Rehab office at [phone].

## 2023-07-09 NOTE — TOC Transition Note (Signed)
 Transition of Care [facility]) - Discharge Note   Patient Details  Name: Todd Krause MRN: [id number] Date of Birth: 03/29/1951  [facility] CM/SW Contact:  Gordy Clement, RN Phone Number: 07/09/2023, 9:41 AM   Clinical Narrative:    Patient will DC to home today. Home O2 will be provided by [employer] and portable to be delivered bedside prior to DC. Family will transport home.           Patient Goals and CMS Choice            Discharge Placement                       Discharge Plan and Services Additional resources added to the After Visit Summary for                  DME Arranged: [employer] DME Agency: [employer] Date DME Agency Contacted: 07/08/23 Time DME Agency Contacted: 1438 Representative spoke with at DME Agency: Vaughan Basta            Social Drivers of Health (SDOH) Interventions SDOH Screenings   Food Insecurity: Food Insecurity Present (07/04/2023)  Housing: Low Risk  (07/04/2023)  Transportation Needs: No Transportation Needs (07/04/2023)  Utilities: Not At Risk (07/04/2023)  Financial Resource Strain: High Risk (05/03/2023)   Received from [facility]  Physical Activity: Insufficiently Active (05/03/2023)   Received from [facility]  Social Connections: Socially Isolated (07/04/2023)  Stress: No Stress Concern Present (05/03/2023)   Received from [facility]  Tobacco Use: Medium Risk (07/04/2023)     Readmission Risk Interventions     No data to display

## 2023-07-09 NOTE — Discharge Instructions (Signed)
 Follow with Primary MD Ladora Daniel, PA-C in 7 days   Get CBC, CMP,  checked  by Primary MD next visit.    Activity: As tolerated with Full fall precautions use walker/cane & assistance as needed   Disposition Home    Diet: Heart Healthy / carb modified   On your next visit with your primary care physician please Get Medicines reviewed and adjusted.   Please request your Prim.MD to go over all Hospital Tests and Procedure/Radiological results at the follow up, please get all Hospital records sent to your Prim MD by signing hospital release before you go home.   If you experience worsening of your admission symptoms, develop shortness of breath, life threatening emergency, suicidal or homicidal thoughts you must seek medical attention immediately by calling 911 or calling your MD immediately  if symptoms less severe.  You Must read complete instructions/literature along with all the possible adverse reactions/side effects for all the Medicines you take and that have been prescribed to you. Take any new Medicines after you have completely understood and accpet all the possible adverse reactions/side effects.   Do not drive, operating heavy machinery, perform activities at heights, swimming or participation in water activities or provide baby sitting services if your were admitted for syncope or siezures until you have seen by Primary MD or a Neurologist and advised to do so again.  Do not drive when taking Pain medications.    Do not take more than prescribed Pain, Sleep and Anxiety Medications  Special Instructions: If you have smoked or chewed Tobacco  in the last 2 yrs please stop smoking, stop any regular Alcohol  and or any Recreational drug use.  Wear Seat belts while driving.   Please note  You were cared for by a hospitalist during your hospital stay. If you have any questions about your discharge medications or the care you received while you were in the hospital after you  are discharged, you can call the unit and asked to speak with the hospitalist on call if the hospitalist that took care of you is not available. Once you are discharged, your primary care physician will handle any further medical issues. Please note that NO REFILLS for any discharge medications will be authorized once you are discharged, as it is imperative that you return to your primary care physician (or establish a relationship with a primary care physician if you do not have one) for your aftercare needs so that they can reassess your need for medications and monitor your lab values.

## 2023-07-11 LAB — CULTURE, BLOOD (SINGLE): Culture: NO GROWTH

## 2023-07-16 NOTE — Progress Notes (Unsigned)
 Cardiology Office Note:    Date:  07/17/2023   ID:  Todd Krause, DOB [birth year], MRN [id number]  PCP:  Ladora Daniel, PA-C  Cardiologist:  Little Ishikawa, MD  Electrophysiologist:  None   Referring MD: Ladora Daniel, PA-C   Chief Complaint  Patient presents with   PAD     History of Present Illness:    Todd Krause is a 73 y.o. male with a hx of PAD, hypertension, hyperlipidemia, OSA, tobacco use T2DM  who presents for follow-up.  He was referred by Elizabeth Palau, NP for evaluation of hypertension and leg pain, initially seen 11/2020.  He reports that he has been having pain and swelling in his left leg.  Reports symptoms developed last year after he had a fall.  He underwent x-ray which showed no fracture and underwent lower extremity duplex which showed no DVT.  Currently reports he gets pain in the back of his leg when he walks, resolves with rest.  He denies any chest pain, dyspnea, lightheadedness, syncope, or palpitations.  Reports he does not exercise due to his leg pain.  He smokes 1.5 packs/day.  Family history includes father had MI in 28s, thinks his son has A. fib.  ABIs 12/21/2020 showed moderate disease on right (0.59) and left (0.72).  Lower extremity arterial duplex showed greater than 50% stenosis of right common iliac artery, 75 to 99% stenosis in left common femoral artery.  He was referred to Dr. Allyson Sabal and underwent lower extremity angiography which showed totally occluded right common iliac artery and 99% stenosis of right common femoral artery.  Was referred to vascular surgery. Underwent right common iliac artery stenting and right common femoral endarterectomy with Dr. Chestine Spore 04/28/2021.  Echocardiogram 12/28/2020 showed EF 50 to 55%, septal dyskinesis consistent with bundle branch block, grade 1 diastolic dysfunction, normal RV function, no significant valvular disease.   Lexiscan Myoview on 03/02/2021 showed normal perfusion (though significant  artifact), EF 48%  He was admitted from 3/6 through 07/09/2023 with sepsis, urine cultures growing Enterococcus faecalis and blood cultures growing E. coli.  He had been on Jardiance, was discontinued.  He had marked AKI with creatinine up to 3.17 on 3/6, improved with IV fluids was 1.28 on discharge on 3/11.  Since last clinic visit, he reports he is doing okay.  Denies any chest pain, dyspnea, lightheadedness, syncope, lower extremity edema, or palpitations.  No pain in legs with walking.   Past Medical History:  Diagnosis Date   Arthritis    hands, knees   BPH (benign prostatic hypertrophy)    Chronic bronchitis (HCC)    Coronary artery disease    ED (erectile dysfunction)    Elevated PSA    GERD (gastroesophageal reflux disease)    Hepatitis    when in High School in the 60's. Does not know which one it was. No current issues   History of colon polyps    History of hiatal hernia    Hyperlipidemia    Hypertension    Left bundle branch block (LBBB)    Lower urinary tract symptoms (LUTS)    OSA (obstructive sleep apnea)    mild to moderate osa per study 02-18-2004--  no cpap   Peripheral vascular disease (HCC)    Prostate nodule    Type 2 diabetes mellitus (HCC)     Past Surgical History:  Procedure Laterality Date   ABDOMINAL AORTOGRAM W/LOWER EXTREMITY N/A 02/06/2021   Procedure: ABDOMINAL AORTOGRAM W/LOWER EXTREMITY;  Surgeon: Allyson Sabal,  Delton See, MD;  Location: [facility];  Service: Cardiovascular;  Laterality: N/A;   AORTOGRAM N/A 04/28/2021   Procedure: AORTOGRAM WITH ILIAC ARTERIOGRAM;  Surgeon: [name], MD;  Location: [facility];  Service: Vascular;  Laterality: N/A;   CARDIAC CATHETERIZATION  11-21-2009  dr Christiane Ha berry   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,  50% mCFX,  mild pulmonary HTN/  preserved LV , ef >60%   COLONOSCOPY  last one -- april 2016   ENDARTERECTOMY FEMORAL Right 04/28/2021   Procedure: RIGHT COMMON FEMORAL ARTERY  ENDARTERECTOMY;  Surgeon: [name], MD;  Location: [facility];  Service: Vascular;  Laterality: Right;   EYE SURGERY Bilateral    iol lens for cataracts   INSERTION OF ILIAC STENT Right 04/28/2021   Procedure: RIGHT COMMON ILIAC ARTERY ANGIOPLASTY WITH INSERTION OF RIGHT ILIAC STENT;  Surgeon: [name], MD;  Location: [facility];  Service: Vascular;  Laterality: Right;   melanoma removed from back  15 yrs ago   PATCH ANGIOPLASTY Right 04/28/2021   Procedure: RIGHT COMMON FEMORAL PATCH ANGIOPLASTY;  Surgeon: [name], MD;  Location: [facility];  Service: Vascular;  Laterality: Right;   PROSTATE BIOPSY N/A 10/01/2014   Procedure: BIOPSY TRANSRECTAL ULTRASONIC PROSTATE (TUBP);  Surgeon: Jethro Bolus, MD;  Location: [facility];  Service: Urology;  Laterality: N/A;   TOOTH EXTRACTION N/A 05/08/2022   Procedure: DENTAL RESTORATION/EXTRACTIONS;  Surgeon: Ocie Doyne, DMD;  Location: [facility];  Service: Oral Surgery;  Laterality: N/A;   TRANSTHORACIC ECHOCARDIOGRAM  11-20-2009   grade I diastolic dysfunction/  ef 60-65%/  mild AR, MR, and TR/  trivial PR   TRANSURETHRAL RESECTION OF PROSTATE N/A 05/09/2015   Procedure: CYSTOSCOPY TRANSURETHRAL RESECTION OF THE PROSTATE (TURP);  Surgeon: Jethro Bolus, MD;  Location: [facility];  Service: Urology;  Laterality: N/A;    Current Medications: Current Meds  Medication Sig   acetaminophen (TYLENOL) 325 MG tablet Take 2 tablets (650 mg total) by mouth every 6 (six) hours as needed for mild pain (pain score 1-3) (or Fever >/= 101).   albuterol (PROVENTIL) (2.5 MG/3ML) 0.083% nebulizer solution Take 2.5 mg by nebulization every 6 (six) hours as needed for wheezing or shortness of breath.   albuterol (VENTOLIN HFA) 108 (90 Base) MCG/ACT inhaler Inhale 2 puffs into the lungs every 4 (four) hours as needed for shortness of breath or wheezing.   amLODipine (NORVASC) 10 MG tablet Take 10 mg by mouth at bedtime.   amoxicillin  (AMOXIL) 500 MG capsule Take 2 capsules (1,000 mg total) by mouth every 8 (eight) hours for 8 days.   aspirin EC 81 MG EC tablet Take 1 tablet (81 mg total) by mouth daily at 6 (six) AM. Swallow whole. (Patient taking differently: Take 81 mg by mouth at bedtime. Swallow whole.)   atorvastatin (LIPITOR) 20 MG tablet Take 20 mg by mouth 3 (three) times a week. In the evening   calcium-vitamin D (OSCAL WITH D) 500-5 MG-MCG tablet Take 1 tablet by mouth 3 (three) times daily.   carvedilol (COREG) 12.5 MG tablet TAKE 1 TABLET TWICE DAILY WITH A MEAL (CONTACT OFFICE AND MAKE APPOINTMENT OR GET REFILL FROM PCP)   Cholecalciferol (VITAMIN D3) 2000 UNITS TABS Take 2,000 Units by mouth every evening.   clopidogrel (PLAVIX) 75 MG tablet TAKE 1 TABLET EVERY DAY  AT  6AM   fenofibrate 54 MG tablet Take 54 mg by mouth daily.   finasteride (PROSCAR) 5  MG tablet Take 1 tablet (5 mg total) by mouth daily. (Patient taking differently: Take 5 mg by mouth every evening.)   HUMALOG MIX 75/25 KWIKPEN (75-25) 100 UNIT/ML KwikPen Inject 15-20 Units into the skin See admin instructions. Inject 20 units into the skin in the morning and 15 units in the evening.   metFORMIN (GLUCOPHAGE) 1000 MG tablet Take 1,000 mg by mouth 2 (two) times daily.   Multiple Vitamin (MULTIVITAMIN) tablet Take 1 tablet by mouth every evening.    omeprazole (PRILOSEC) 40 MG capsule Take 40 mg by mouth 2 (two) times daily.   Red Yeast Rice 600 MG CAPS Take 600 mg by mouth every evening.    tamsulosin (FLOMAX) 0.4 MG CAPS capsule Take 0.4 mg by mouth 2 (two) times daily.   vitamin B-12 (CYANOCOBALAMIN) 1000 MCG tablet Take 1,000 mcg by mouth every evening.    [DISCONTINUED] spironolactone (ALDACTONE) 25 MG tablet Take 1 tablet (25 mg total) by mouth daily.   [DISCONTINUED] valsartan-hydrochlorothiazide (DIOVAN HCT) 320-25 MG tablet Take 0.5 tablets by mouth daily.     Allergies:   Latex, Tramadol, and Codeine   Social History   Socioeconomic  History   Marital status: Widowed    Spouse name: Not on file   Number of children: 2   Years of education: Not on file   Highest education level: Not on file  Occupational History   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attle)  Tobacco Use   Smoking status: Former    Current packs/day: 0.00    Average packs/day: 1 pack/day for 30.0 years (30.0 ttl pk-yrs)    Types: Cigarettes    Start date: 09/22/1976    Quit date: 09/23/2006    Years since quitting: 16.8   Smokeless tobacco: Never  Vaping Use   Vaping status: Never Used  Substance and Sexual Activity   Alcohol use: Not Currently    Comment: occasionally   Drug use: No   Sexual activity: Not Currently    Partners: Female    Comment: widowed  Other Topics Concern   Not on file  Social History Narrative   No health insurance   Social Drivers of Health   Financial Resource Strain: High Risk (05/03/2023)   Received from [facility]   Overall Financial Resource Strain (CARDIA)    Difficulty of Paying Living Expenses: Hard  Food Insecurity: Food Insecurity Present (07/04/2023)   Hunger Vital Sign    Worried About Running Out of Food in the Last Year: Often true    Ran Out of Food in the Last Year: Often true  Transportation Needs: No Transportation Needs (07/04/2023)   PRAPARE - Administrator, Civil Service (Medical): No    Lack of Transportation (Non-Medical): No  Physical Activity: Insufficiently Active (05/03/2023)   Received from [facility]   Exercise Vital Sign    Days of Exercise per Week: 2 days    Minutes of Exercise per Session: 20 min  Stress: No Stress Concern Present (05/03/2023)   Received from [facility] of Occupational Health - Occupational Stress Questionnaire    Feeling of Stress : Only a little  Social Connections: Socially Isolated (07/04/2023)   Social Connection and Isolation Panel [NHANES]    Frequency of Communication with Friends and Family: More than three times a week    Frequency  of Social Gatherings with Friends and Family: More than three times a week    Attends Religious Services: Never    Active Member  of Clubs or Organizations: No    Attends Banker Meetings: Never    Marital Status: Widowed     Family History: The patient's family history includes Cancer in his mother; Diabetes in his father and mother; Heart attack in his father. There is no history of Colon cancer, Colon polyps, Kidney disease, Gallbladder disease, or Esophageal cancer.  ROS:   Please see the history of present illness.     All other systems reviewed and are negative.  EKGs/Labs/Other Studies Reviewed:    The following studies were reviewed today:   EKG:   07/17/2023: Sinus bradycardia, rate 53, left bundle branch block, PACs  Recent Labs: 07/05/2023: ALT 30 07/06/2023: Magnesium 1.7 07/08/2023: B Natriuretic Peptide 174.9 07/09/2023: BUN 29; Creatinine, Ser 1.28; Hemoglobin 12.7; Platelets 176; Potassium 3.6; Sodium 134  Recent Lipid Panel    Component Value Date/Time   CHOL 118 04/29/2021 0304   TRIG 170 (H) 04/29/2021 0304   HDL 30 (L) 04/29/2021 0304   CHOLHDL 3.9 04/29/2021 0304   VLDL 34 04/29/2021 0304   LDLCALC 54 04/29/2021 0304   LDLDIRECT 67.7 06/09/2010 1147    Physical Exam:    VS:  BP 122/60   Pulse (!) 53   Ht 5\' 8"  (1.727 m)   Wt 190 lb (86.2 kg)   SpO2 94%   BMI 28.89 kg/m     Wt Readings from Last 3 Encounters:  07/17/23 190 lb (86.2 kg)  07/04/23 210 lb (95.3 kg)  05/08/22 212 lb (96.2 kg)     GEN:  Well nourished, well developed in no acute distress HEENT: Normal NECK: No JVD; No carotid bruits LYMPHATICS: No lymphadenopathy CARDIAC: RRR, no murmurs, rubs, gallops RESPIRATORY:  Clear to auscultation without rales, wheezing or rhonchi  ABDOMEN: Soft, non-tender, non-distended MUSCULOSKELETAL:  No edema; No deformity  SKIN: Warm and dry NEUROLOGIC:  Alert and oriented x 3 PSYCHIATRIC:  Normal affect   ASSESSMENT:    1.  Hyperkalemia   2. AKI (acute kidney injury) (HCC)   3. Peripheral arterial disease (HCC)   4. Hypertension, unspecified type   5. Hyperlipidemia, unspecified hyperlipidemia type      PLAN:    Hyperkalemia: Had labs drawn yesterday with PCP, potassium is 6.2.  His spironolactone and valsartan-HCTZ were held on recent discharge after admission with sepsis given marked AKI, but was told to restart 4 days after discharge -Appears asymptomatic, denies any lightheadedness, muscle weakness, N/V.  EKG unchanged.  Recommend stopping spironolactone and valsartan-HCTZ.  Will recheck BMET today.  PAD: ABIs 12/21/2020 showed moderate disease on right (0.59) and left (0.72).  Lower extremity arterial duplex showed greater than 50% stenosis of right common iliac artery, 75 to 99% stenosis in left common femoral artery.  He was referred to Dr. Allyson Sabal and underwent lower extremity angiography which showed totally occluded right common iliac artery and 99% stenosis of right common femoral artery.  Was referred to vascular surgery. Underwent right common iliac artery stenting and right common femoral endarterectomy with Dr. Chestine Spore 04/28/2021.  Claudication symptoms resolved with intervention on right, Dr. Chestine Spore recommended monitoring left and can consider common femoral endarterectomy if develops symptoms.  ABIs 02/2022 were right 1.21, left 0.9 -Continue aspirin, Plavix -Continue atorvastatin  LBBB: Echocardiogram 12/28/2020 showed EF 50 to 55%, septal dyskinesis consistent with bundle branch block, grade 1 diastolic dysfunction, normal RV function, no significant valvular disease.   Lexiscan Myoview on 03/02/2021 showed normal perfusion (though significant artifact), EF 48%  Hypertension: On valsartan-HCTZ  160-12.5 mg daily, amlodipine 10 mg daily, carvedilol 12.5 mg twice daily, spironolactone 25 mg daily.  Stop spironolactone and valsartan-HCTZ as above.  Asked to check BP twice daily for next week and let us know  results  Hyperlipidemia: On atorvastatin 20 mg 3 times weekly.  LDL 54 on 04/29/2021.  Check lipid panel  T2DM: On metformin, insulin.  A1c 7.8% on 07/04/2023.  On Jardiance but discontinued after admission with urosepsis 06/2023  Tobacco use: Quit smoking in 2023.  AKI: creatinine up to 3.17 on admission for sepsis on 07/04/23, improved with IV fluids was 1.28 on discharge on 07/09/23.  Labs yesterday with PCP showed creatinine 1.48.  Stop Aldactone and valsartan-HCTZ as above  RTC in 6 months  Medication Adjustments/Labs and Tests Ordered: Current medicines are reviewed at length with the patient today.  Concerns regarding medicines are outlined above.  Orders Placed This Encounter  Procedures   Basic metabolic panel   Lipid panel   EKG 12-Lead    No orders of the defined types were placed in this encounter.    Patient Instructions  Other Instructions    Record your blood pressure twice a day for 7 days , then send information to  Dr Bjorn Pippin through Mychart   In the next few days if you feel lightheaded , and weak  nausea and/or vomiting  please proceed to nearest Emergency room    Medication Instructions:    Stop taking Spironolactone Stop taking Valsartan- Hydrochlorothiazide   *If you need a refill on your cardiac medications before your next appointment, please call your pharmacy*   Lab Work: Lipid BMP If you have labs (blood work) drawn today and your tests are completely normal, you will receive your results only by: MyChart Message (if you have MyChart) OR A paper copy in the mail If you have any lab test that is abnormal or we need to change your treatment, we will call you to review the results.   Testing/Procedures:  Not needed  Follow-Up: At [facility], you and your health needs are our priority.  As part of our continuing mission to provide you with exceptional heart care, we have created designated Provider Care Teams.  These Care Teams include  your primary Cardiologist (physician) and Advanced Practice Providers (APPs -  Physician Assistants and Nurse Practitioners) who all work together to provide you with the care you need, when you need it.     Your next appointment:   3 month(s)  The format for your next appointment:   In Person  Provider:   Little Ishikawa, MD    Other Instructions    Record your blood pressure twice a day for 7 days , then send information to  Dr Bjorn Pippin through Mychart   In the next few days if you feel lightheaded , and weak  nausea and/or vomiting  please proceed to nearest Emergency room    Signed, Little Ishikawa, MD  07/17/2023 10:56 AM    [facility]

## 2023-07-17 ENCOUNTER — Ambulatory Visit: Payer: Medicare HMO | Attending: Cardiology | Admitting: Cardiology

## 2023-07-17 ENCOUNTER — Encounter: Payer: Self-pay | Admitting: Cardiology

## 2023-07-17 ENCOUNTER — Telehealth: Payer: Self-pay | Admitting: Physician Assistant

## 2023-07-17 VITALS — BP 122/60 | HR 53 | Ht 68.0 in | Wt 190.0 lb

## 2023-07-17 DIAGNOSIS — N179 Acute kidney failure, unspecified: Secondary | ICD-10-CM

## 2023-07-17 DIAGNOSIS — E875 Hyperkalemia: Secondary | ICD-10-CM

## 2023-07-17 DIAGNOSIS — I739 Peripheral vascular disease, unspecified: Secondary | ICD-10-CM

## 2023-07-17 DIAGNOSIS — I10 Essential (primary) hypertension: Secondary | ICD-10-CM | POA: Diagnosis not present

## 2023-07-17 DIAGNOSIS — E785 Hyperlipidemia, unspecified: Secondary | ICD-10-CM

## 2023-07-17 LAB — LIPID PANEL
Chol/HDL Ratio: 3.7 ratio (ref 0.0–5.0)
Cholesterol, Total: 132 mg/dL (ref 100–199)
HDL: 36 mg/dL — ABNORMAL LOW (ref >39)
LDL Chol Calc (NIH): 47 mg/dL (ref 0–99)
Triglycerides: 321 mg/dL — ABNORMAL HIGH (ref 0–149)
VLDL Cholesterol Cal: 49 mg/dL — ABNORMAL HIGH (ref 5–40)

## 2023-07-17 LAB — BASIC METABOLIC PANEL
BUN/Creatinine Ratio: 16 (ref 10–24)
BUN: 20 mg/dL (ref 8–27)
CO2: 21 mmol/L (ref 20–29)
Calcium: 9.4 mg/dL (ref 8.6–10.2)
Chloride: 100 mmol/L (ref 96–106)
Creatinine, Ser: 1.23 mg/dL (ref 0.76–1.27)
Glucose: 152 mg/dL — ABNORMAL HIGH (ref 70–99)
Potassium: 6.2 mmol/L (ref 3.5–5.2)
Sodium: 135 mmol/L (ref 134–144)
eGFR: 61 mL/min/{1.73_m2} (ref >59)

## 2023-07-17 NOTE — Telephone Encounter (Signed)
   [facility] called the answering service after-hours today re: K 6.2. Cr stable at 1.23. Pt saw Dr. Bjorn Pippin today in clinic who noted patient had labs at PCP yesterday also showing K of 6.2. He stopped the patient's spironolactone and valsartan-hydrochlorothiazide, reported EKG was felt to be stable, VSS, and patient had no symptoms of lightheadedness, muscle weakness, N/V. D/w Dr. Servando Salina. Given stability at OV, she feels OK to hold off sending to ER/[place] but keep off valsartan-hydrochlorothiazide and spironolactone as advised and send to Dr. Bjorn Pippin for review in AM to guide timing of recheck. I reached out to patient but got VMx3. LMTCB for patient to call back to reiterate med plan. He does not have DPR on file to divulge medical information but I reached out to son on file but got no answer. Was able to reach his daughter Arline Asp listed - [phone] and she was going to try to reach him. Right after we hung up the patient called back. I relayed information above. He affirms he is completely asymptomatic and feeling well. He does not wish to go to ER for recheck/acute management; he will remain off valsartan-hydrochlorothiazide and spironolactone as advised. He has f/u appt with PCP tomorrow as well. Reiterated recommendation to come to ED for any new symptoms at all. Will cc to Dr. Bjorn Pippin to advise on follow-up plan.  Laurann Montana, PA-C

## 2023-07-17 NOTE — Patient Instructions (Signed)
 Other Instructions    Record your blood pressure twice a day for 7 days , then send information to  Dr Bjorn Pippin through Mychart   In the next few days if you feel lightheaded , and weak  nausea and/or vomiting  please proceed to nearest Emergency room    Medication Instructions:    Stop taking Spironolactone Stop taking Valsartan- Hydrochlorothiazide   *If you need a refill on your cardiac medications before your next appointment, please call your pharmacy*   Lab Work: Lipid BMP If you have labs (blood work) drawn today and your tests are completely normal, you will receive your results only by: MyChart Message (if you have MyChart) OR A paper copy in the mail If you have any lab test that is abnormal or we need to change your treatment, we will call you to review the results.   Testing/Procedures:  Not needed  Follow-Up: At [facility], you and your health needs are our priority.  As part of our continuing mission to provide you with exceptional heart care, we have created designated Provider Care Teams.  These Care Teams include your primary Cardiologist (physician) and Advanced Practice Providers (APPs -  Physician Assistants and Nurse Practitioners) who all work together to provide you with the care you need, when you need it.     Your next appointment:   3 month(s)  The format for your next appointment:   In Person  Provider:   Little Ishikawa, MD    Other Instructions    Record your blood pressure twice a day for 7 days , then send information to  Dr Bjorn Pippin through Mychart   In the next few days if you feel lightheaded , and weak  nausea and/or vomiting  please proceed to nearest Emergency room

## 2023-07-18 NOTE — Telephone Encounter (Signed)
 See result note.

## 2023-07-19 ENCOUNTER — Encounter: Payer: Self-pay | Admitting: *Deleted

## 2023-07-23 ENCOUNTER — Telehealth: Payer: Self-pay | Admitting: Cardiology

## 2023-07-23 DIAGNOSIS — Z79899 Other long term (current) drug therapy: Secondary | ICD-10-CM

## 2023-07-23 DIAGNOSIS — E875 Hyperkalemia: Secondary | ICD-10-CM

## 2023-07-23 NOTE — Telephone Encounter (Signed)
 Pt c/o BP issue: STAT if pt c/o blurred vision, one-sided weakness or slurred speech.  STAT if BP is GREATER than 180/120 TODAY.  STAT if BP is LESS than 90/60 and SYMPTOMATIC TODAY  1. What is your BP concern? BP starting to elevated  2. Have you taken any BP medication today? Yes  3. What are your last 5 BP readings? 7:30AM 169/80 174/82 last reading: 181/82 last night    4. Are you having any other symptoms (ex. Dizziness, headache, blurred vision, passed out)? Pt thinks he needs to go back on the medication he was taking off at the most recent appt.

## 2023-07-23 NOTE — Telephone Encounter (Signed)
 Last week we discontinued his spironolactone and valsartan-HCTZ due to hyperkalemia.  He was supposed to follow-up for BMET at our office, but appears had recheck done through PCP and potassium improved but still mildly elevated.  Recommend restarting just HCTZ 12.5 mg daily.  Check BMET/magnesium in 1 week.  Recommend checking BP daily for next 2 weeks and letting us know results

## 2023-07-23 NOTE — Telephone Encounter (Signed)
 Patient identification verified by 2 forms. Shade Flood, RN     Called and spoke to patient  Patient states:  - BP elevated since last office visit when valsartan-hydrochlorothiazide was d/c - Last readings 169/80 today. 174/82, 181/82  - BP readings are after taking medication  -no recent increase in salt and drinking plenty of fluids  Patient denies:  -  chest pain, headache, SOB, vision changes, weakness or difficulty speaking.              Interventions/Plan: - encounter forwarded to primary cardiologist for recommendations   Reviewed ED warning signs/precautions  Patient agrees with plan, no questions at this time

## 2023-07-24 MED ORDER — HYDROCHLOROTHIAZIDE 12.5 MG PO CAPS: 12.5000 mg | ORAL_CAPSULE | Freq: Every day | ORAL | 0 refills | Status: DC

## 2023-07-24 NOTE — Telephone Encounter (Signed)
Patient calling for update. Please advise  

## 2023-07-24 NOTE — Telephone Encounter (Signed)
 Patient identification verified by 2 forms. Marilynn Rail, RN    Called and spoke to patient  Relayed provider message below  Advised patient:   -start hydrochlorothiazide 12.5mg  daily   -present to lab in 1 week   -keep BP log for 2 weeks and follow up with office  Patient agrees with plan, no questions at this time

## 2023-07-29 ENCOUNTER — Telehealth: Payer: Self-pay | Admitting: *Deleted

## 2023-07-29 ENCOUNTER — Other Ambulatory Visit: Payer: Self-pay | Admitting: *Deleted

## 2023-07-29 NOTE — Telephone Encounter (Signed)
 Called and spoke to patient and he verbalized he stopped taking spirolactone and Valsartan-HCTZ and will have labs drawn this week Wednesday.

## 2023-07-31 ENCOUNTER — Other Ambulatory Visit: Payer: Self-pay

## 2023-07-31 DIAGNOSIS — I10 Essential (primary) hypertension: Secondary | ICD-10-CM

## 2023-07-31 LAB — BASIC METABOLIC PANEL WITH GFR
BUN/Creatinine Ratio: 21 (ref 10–24)
BUN: 20 mg/dL (ref 8–27)
CO2: 23 mmol/L (ref 20–29)
Calcium: 9.2 mg/dL (ref 8.6–10.2)
Chloride: 99 mmol/L (ref 96–106)
Creatinine, Ser: 0.96 mg/dL (ref 0.76–1.27)
Glucose: 324 mg/dL — ABNORMAL HIGH (ref 70–99)
Potassium: 5.2 mmol/L (ref 3.5–5.2)
Sodium: 137 mmol/L (ref 134–144)
eGFR: 82 mL/min/{1.73_m2} (ref >59)

## 2023-07-31 LAB — MAGNESIUM: Magnesium: 1 mg/dL — ABNORMAL LOW (ref 1.6–2.3)

## 2023-08-01 ENCOUNTER — Other Ambulatory Visit: Payer: Self-pay | Admitting: Cardiology

## 2023-08-01 MED ORDER — MAGNESIUM OXIDE 400 MG PO TABS: 400.0000 mg | ORAL_TABLET | Freq: Two times a day (BID) | ORAL | 0 refills | Status: DC

## 2023-08-05 ENCOUNTER — Telehealth: Payer: Self-pay | Admitting: Licensed Clinical Social Worker

## 2023-08-05 NOTE — Telephone Encounter (Signed)
 H&V Care Navigation CSW Progress Note  Clinical Social Worker contacted patient by phone to f/u on BP Cuff request. Left voicemail for pt at [phone] f/u on RN recommendation for pt to call insurance plan regarding plan coverage for this. Recommended he call me back as needed if no coverage with plan so we can further assist.   Patient is participating in a Managed Medicaid Plan:  No, UHC Medicare Dual Complete  SDOH Screenings   Food Insecurity: Food Insecurity Present (07/04/2023)  Housing: Low Risk  (07/04/2023)  Transportation Needs: No Transportation Needs (07/04/2023)  Utilities: Not At Risk (07/04/2023)  Financial Resource Strain: High Risk (05/03/2023)   Received from [facility]  Physical Activity: Insufficiently Active (05/03/2023)   Received from [facility]  Social Connections: Socially Isolated (07/04/2023)  Stress: No Stress Concern Present (05/03/2023)   Received from [facility]  Tobacco Use: Medium Risk (07/17/2023)    Octavio Graves, MSW, LCSW Clinical Social Worker II [facility] Heart/Vascular Care Navigation  [phone]- work cell phone (preferred) [phone]- desk phone

## 2023-08-05 NOTE — Telephone Encounter (Signed)
 H&V Care Navigation CSW Progress Note  Clinical Social Worker  received call back from pt  to clarify he called his insurance company and they shared they would cover all but 20% of the cost of a cuff. Pt shares they did not need an order for this assistance- inquired if he asked where he could pick it up- he did not, encouraged him to call and see if they order it for him or if he needs to utilize a particular store. Insurance per his report would significantly discount this cost for pt (~$6-10 for cuff total). If any additional issues/final cost remains a concern provided him my number directly to coordinate. Pt does clarify that he now has [facility] Dual plan he tried to give front desk his card and was told they didn't need it. Has labs tomorrow, encouraged him to try and provide it again tomorrow. Remain available as needed once pt has clarity on how to obtain cuff.   Patient is participating in a Managed Medicaid Plan:  No, [employer] Medicare dual complete plan  SDOH Screenings   Food Insecurity: Food Insecurity Present (07/04/2023)  Housing: Low Risk  (07/04/2023)  Transportation Needs: No Transportation Needs (07/04/2023)  Utilities: Not At Risk (07/04/2023)  Financial Resource Strain: High Risk (05/03/2023)   Received from [facility]  Physical Activity: Insufficiently Active (05/03/2023)   Received from [facility]  Social Connections: Socially Isolated (07/04/2023)  Stress: No Stress Concern Present (05/03/2023)   Received from [facility]  Tobacco Use: Medium Risk (07/17/2023)     Octavio Graves, MSW, LCSW Clinical Social Worker II [facility] Health Heart/Vascular Care Navigation  [phone]- work cell phone (preferred) [phone]- desk phone

## 2023-08-06 LAB — BASIC METABOLIC PANEL WITH GFR
BUN/Creatinine Ratio: 15 (ref 10–24)
BUN: 14 mg/dL (ref 8–27)
CO2: 21 mmol/L (ref 20–29)
Calcium: 9.3 mg/dL (ref 8.6–10.2)
Chloride: 101 mmol/L (ref 96–106)
Creatinine, Ser: 0.96 mg/dL (ref 0.76–1.27)
Glucose: 144 mg/dL — ABNORMAL HIGH (ref 70–99)
Potassium: 5.5 mmol/L — ABNORMAL HIGH (ref 3.5–5.2)
Sodium: 139 mmol/L (ref 134–144)
eGFR: 82 mL/min/{1.73_m2} (ref >59)

## 2023-08-06 LAB — MAGNESIUM: Magnesium: 1.4 mg/dL — ABNORMAL LOW (ref 1.6–2.3)

## 2023-08-07 ENCOUNTER — Telehealth: Payer: Self-pay | Admitting: Licensed Clinical Social Worker

## 2023-08-07 ENCOUNTER — Other Ambulatory Visit: Payer: Self-pay | Admitting: *Deleted

## 2023-08-07 DIAGNOSIS — I10 Essential (primary) hypertension: Secondary | ICD-10-CM

## 2023-08-07 NOTE — Telephone Encounter (Addendum)
 H&V Care Navigation CSW Progress Note  Clinical Social Worker  received a call from pt insurance  to f/u on BP cuff prior auth. I shared that I would need to know what documentation is requested and where for provider's team to securely send this documentation as I cannot attest to medical necessity. They will call me back.  12:09pm- representative called me back with number for clinical team to initiate prior authorization for BP Cuff- [phone]. Will route this to RN /LPN team.   Patient is participating in a Managed Medicaid Plan:  No, [employer] Medicare  SDOH Screenings   Food Insecurity: Food Insecurity Present (07/04/2023)  Housing: Low Risk  (07/04/2023)  Transportation Needs: No Transportation Needs (07/04/2023)  Utilities: Not At Risk (07/04/2023)  Financial Resource Strain: High Risk (05/03/2023)   Received from [facility]  Physical Activity: Insufficiently Active (05/03/2023)   Received from [facility]  Social Connections: Socially Isolated (07/04/2023)  Stress: No Stress Concern Present (05/03/2023)   Received from [facility]  Tobacco Use: Medium Risk (07/17/2023)    Octavio Graves, MSW, LCSW Clinical Social Worker II [facility] Health Heart/Vascular Care Navigation  [phone]- work cell phone (preferred) [phone]- desk phone

## 2023-08-07 NOTE — Telephone Encounter (Signed)
 H&V Care Navigation CSW Progress Note  Clinical Social Worker contacted patient by phone to f/u on BP Cuff request. Was able to reach him today at [phone]. He has not yet called insurance company back regarding where he is eligible to get the cuff from at discounted rate. He is okay with affording up to $10 for cuff. If any additional costs or unclear where he should get cuff from I have requested he call me back. If he doesn't call back by next week I will f/u again.  Patient is participating in a Managed Medicaid Plan:  No, [employer] Medicare and Medicaid (confirmed with pt card now scanned in to chart)  SDOH Screenings   Food Insecurity: Food Insecurity Present (07/04/2023)  Housing: Low Risk  (07/04/2023)  Transportation Needs: No Transportation Needs (07/04/2023)  Utilities: Not At Risk (07/04/2023)  Financial Resource Strain: High Risk (05/03/2023)   Received from [facility]  Physical Activity: Insufficiently Active (05/03/2023)   Received from [facility]  Social Connections: Socially Isolated (07/04/2023)  Stress: No Stress Concern Present (05/03/2023)   Received from [facility]  Tobacco Use: Medium Risk (07/17/2023)    Octavio Graves, MSW, LCSW Clinical Social Worker II [facility] Heart/Vascular Care Navigation  [phone]- work cell phone (preferred) [phone]- desk phone

## 2023-08-08 NOTE — Telephone Encounter (Signed)
 Yes recommend initiating prior auth

## 2023-08-14 ENCOUNTER — Encounter: Payer: Self-pay | Admitting: *Deleted

## 2023-08-15 ENCOUNTER — Encounter: Payer: Self-pay | Admitting: *Deleted

## 2023-08-16 ENCOUNTER — Other Ambulatory Visit (HOSPITAL_COMMUNITY): Payer: Self-pay

## 2023-08-16 ENCOUNTER — Other Ambulatory Visit (HOSPITAL_COMMUNITY)
Admission: RE | Admit: 2023-08-16 | Discharge: 2023-08-16 | Disposition: A | Discharge status: Home / Self Care | Source: Ambulatory Visit | Attending: Cardiology | Admitting: Cardiology

## 2023-08-16 ENCOUNTER — Other Ambulatory Visit: Payer: Self-pay

## 2023-08-16 ENCOUNTER — Other Ambulatory Visit: Payer: Self-pay | Admitting: Cardiovascular Disease

## 2023-08-16 LAB — BASIC METABOLIC PANEL WITH GFR
Anion gap: 10 (ref 5–15)
BUN: 20 mg/dL (ref 8–23)
CO2: 24 mmol/L (ref 22–32)
Calcium: 9.1 mg/dL (ref 8.9–10.3)
Chloride: 104 mmol/L (ref 98–111)
Creatinine, Ser: 0.82 mg/dL (ref 0.61–1.24)
GFR, Estimated: >60 mL/min (ref >60)
Glucose, Bld: 154 mg/dL — ABNORMAL HIGH (ref 70–99)
Potassium: 4.8 mmol/L (ref 3.5–5.1)
Sodium: 138 mmol/L (ref 135–145)

## 2023-08-16 LAB — MAGNESIUM: Magnesium: 1.5 mg/dL — ABNORMAL LOW (ref 1.7–2.4)

## 2023-08-16 MED ORDER — CARVEDILOL 12.5 MG PO TABS: 12.5000 mg | ORAL_TABLET | Freq: Two times a day (BID) | ORAL | 3 refills | Status: DC

## 2023-08-16 NOTE — Telephone Encounter (Signed)
 Per [facility] they do not cover BP Monitor since it can be bought over the counter

## 2023-08-16 NOTE — Telephone Encounter (Signed)
 Pt called back and said he cannot afford the BP Monitor

## 2023-08-19 ENCOUNTER — Telehealth (HOSPITAL_BASED_OUTPATIENT_CLINIC_OR_DEPARTMENT_OTHER): Payer: Self-pay | Admitting: Licensed Clinical Social Worker

## 2023-08-19 NOTE — Telephone Encounter (Signed)
 H&V Care Navigation CSW Progress Note  Clinical Social Worker contacted patient by phone to f/u on BP cuff, provider team unsure about CPT requested by insurance and we have received mixed messages from [employer]. LCSW team will provide pt cuff due to pt financial need/inability to access benefits from insurance plan. Pt aware, will pick up tomorrow morning from the [facility] clinic. Provider and team aware.  Patient is participating in a Managed Medicaid Plan:  No, [employer] Medicare only  SDOH Screenings   Food Insecurity: Food Insecurity Present (07/04/2023)  Housing: Low Risk  (07/04/2023)  Transportation Needs: No Transportation Needs (07/04/2023)  Utilities: Not At Risk (07/04/2023)  Financial Resource Strain: High Risk (05/03/2023)   Received from [facility]  Physical Activity: Insufficiently Active (05/03/2023)   Received from [facility]  Social Connections: Socially Isolated (07/04/2023)  Stress: No Stress Concern Present (05/03/2023)   Received from [facility]  Tobacco Use: Medium Risk (07/17/2023)    Nathen Balder, MSW, LCSW Clinical Social Worker II [facility] Health Heart/Vascular Care Navigation  [phone]- work cell phone (preferred) [phone]- desk phone

## 2023-08-28 ENCOUNTER — Other Ambulatory Visit: Payer: Self-pay | Admitting: Cardiology

## 2023-10-20 NOTE — Progress Notes (Unsigned)
 Cardiology Office Note:    Date:  10/21/2023   ID:  Todd Krause, DOB [birth year], MRN [id number]  PCP:  Samie Frederick, PA-C  Cardiologist:  Lonni LITTIE Nanas, MD  Electrophysiologist:  None   Referring MD: Samie Frederick, PA-C   Chief Complaint  Patient presents with   Hypertension     History of Present Illness:    Todd Krause is a 73 y.o. male with a hx of PAD, hypertension, hyperlipidemia, OSA, tobacco use T2DM  who presents for follow-up.  He was referred by Verneita Piety, NP for evaluation of hypertension and leg pain, initially seen 11/2020.  He reports that he has been having pain and swelling in his left leg.  Reports symptoms developed last year after he had a fall.  He underwent x-ray which showed no fracture and underwent lower extremity duplex which showed no DVT.  Currently reports he gets pain in the back of his leg when he walks, resolves with rest.  He denies any chest pain, dyspnea, lightheadedness, syncope, or palpitations.  Reports he does not exercise due to his leg pain.  He smokes 1.5 packs/day.  Family history includes father had MI in 59s, thinks his son has A. fib.  ABIs 12/21/2020 showed moderate disease on right (0.59) and left (0.72).  Lower extremity arterial duplex showed greater than 50% stenosis of right common iliac artery, 75 to 99% stenosis in left common femoral artery.  He was referred to Dr. Court and underwent lower extremity angiography which showed totally occluded right common iliac artery and 99% stenosis of right common femoral artery.  Was referred to vascular surgery. Underwent right common iliac artery stenting and right common femoral endarterectomy with Dr. Gretta 04/28/2021.  Echocardiogram 12/28/2020 showed EF 50 to 55%, septal dyskinesis consistent with bundle branch block, grade 1 diastolic dysfunction, normal RV function, no significant valvular disease.   Lexiscan  Myoview  on 03/02/2021 showed normal perfusion (though  significant artifact), EF 48%  He was admitted from 3/6 through 07/09/2023 with sepsis, urine cultures growing Enterococcus faecalis and blood cultures growing E. coli.  He had been on Jardiance, was discontinued.  He had marked AKI with creatinine up to 3.17 on 3/6, improved with IV fluids was 1.28 on discharge on 3/11.  Since last clinic visit, he reports he is doing well.  Denies any chest pain, dyspnea, lightheadedness, syncope, lower extremity edema, or palpitations.  No pain in legs with walking.  He has been taking his dog for walks daily.  Reports BP has been running high at home.   Past Medical History:  Diagnosis Date   Arthritis    hands, knees   BPH (benign prostatic hypertrophy)    Chronic bronchitis (HCC)    Coronary artery disease    ED (erectile dysfunction)    Elevated PSA    GERD (gastroesophageal reflux disease)    Hepatitis    when in High School in the 60's. Does not know which one it was. No current issues   History of colon polyps    History of hiatal hernia    Hyperlipidemia    Hypertension    Left bundle branch block (LBBB)    Lower urinary tract symptoms (LUTS)    OSA (obstructive sleep apnea)    mild to moderate osa per study 02-18-2004--  no cpap   Peripheral vascular disease (HCC)    Prostate nodule    Type 2 diabetes mellitus (HCC)     Past Surgical History:  Procedure Laterality  Date   ABDOMINAL AORTOGRAM W/LOWER EXTREMITY N/A 02/06/2021   Procedure: ABDOMINAL AORTOGRAM W/LOWER EXTREMITY;  Surgeon: Court Dorn PARAS, MD;  Location: [facility];  Service: Cardiovascular;  Laterality: N/A;   AORTOGRAM N/A 04/28/2021   Procedure: AORTOGRAM WITH ILIAC ARTERIOGRAM;  Surgeon: Gretta Lonni PARAS, MD;  Location: [facility];  Service: Vascular;  Laterality: N/A;   CARDIAC CATHETERIZATION  11-21-2009  dr dorn berry   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,  50% mCFX,  mild pulmonary HTN/  preserved LV , ef >60%   COLONOSCOPY  last one -- april  2016   ENDARTERECTOMY FEMORAL Right 04/28/2021   Procedure: RIGHT COMMON FEMORAL ARTERY ENDARTERECTOMY;  Surgeon: Gretta Lonni PARAS, MD;  Location: [facility];  Service: Vascular;  Laterality: Right;   EYE SURGERY Bilateral    iol lens for cataracts   INSERTION OF ILIAC STENT Right 04/28/2021   Procedure: RIGHT COMMON ILIAC ARTERY ANGIOPLASTY WITH INSERTION OF RIGHT ILIAC STENT;  Surgeon: Gretta Lonni PARAS, MD;  Location: [facility];  Service: Vascular;  Laterality: Right;   melanoma removed from back  15 yrs ago   PATCH ANGIOPLASTY Right 04/28/2021   Procedure: RIGHT COMMON FEMORAL PATCH ANGIOPLASTY;  Surgeon: Gretta Lonni PARAS, MD;  Location: [facility];  Service: Vascular;  Laterality: Right;   PROSTATE BIOPSY N/A 10/01/2014   Procedure: BIOPSY TRANSRECTAL ULTRASONIC PROSTATE (TUBP);  Surgeon: Arlena Gal, MD;  Location: [facility];  Service: Urology;  Laterality: N/A;   TOOTH EXTRACTION N/A 05/08/2022   Procedure: DENTAL RESTORATION/EXTRACTIONS;  Surgeon: Sheryle Hamilton, DMD;  Location: [facility];  Service: Oral Surgery;  Laterality: N/A;   TRANSTHORACIC ECHOCARDIOGRAM  11-20-2009   grade I diastolic dysfunction/  ef 60-65%/  mild AR, MR, and TR/  trivial PR   TRANSURETHRAL RESECTION OF PROSTATE N/A 05/09/2015   Procedure: CYSTOSCOPY TRANSURETHRAL RESECTION OF THE PROSTATE (TURP);  Surgeon: Arlena Gal, MD;  Location: [facility];  Service: Urology;  Laterality: N/A;    Current Medications: Current Meds  Medication Sig   ACCU-CHEK GUIDE TEST test strip SMARTSIG:Strip(s)   acetaminophen  (TYLENOL ) 325 MG tablet Take 2 tablets (650 mg total) by mouth every 6 (six) hours as needed for mild pain (pain score 1-3) (or Fever >/= 101).   albuterol  (PROVENTIL ) (2.5 MG/3ML) 0.083% nebulizer solution Take 2.5 mg by nebulization every 6 (six) hours as needed for wheezing or shortness of breath.   albuterol  (VENTOLIN  HFA) 108 (90 Base) MCG/ACT inhaler Inhale 2 puffs into the lungs every 4  (four) hours as needed for shortness of breath or wheezing.   amLODipine  (NORVASC ) 10 MG tablet Take 10 mg by mouth at bedtime.   aspirin  EC 81 MG EC tablet Take 1 tablet (81 mg total) by mouth daily at 6 (six) AM. Swallow whole.   atorvastatin  (LIPITOR ) 20 MG tablet Take 20 mg by mouth 3 (three) times a week. In the evening   calcium -vitamin D  (OSCAL WITH D) 500-5 MG-MCG tablet Take 1 tablet by mouth 3 (three) times daily.   carvedilol  (COREG ) 12.5 MG tablet Take 1 tablet (12.5 mg total) by mouth 2 (two) times daily with a meal.   celecoxib (CELEBREX) 200 MG capsule Take 200 mg by mouth 2 (two) times daily.   Cholecalciferol  (VITAMIN D3) 2000 UNITS TABS Take 2,000 Units by mouth every evening.   clopidogrel  (PLAVIX ) 75 MG tablet TAKE 1 TABLET EVERY DAY  AT  6AM   fenofibrate  54 MG tablet Take 54 mg by mouth  daily.   finasteride  (PROSCAR ) 5 MG tablet Take 1 tablet (5 mg total) by mouth daily.   hydrALAZINE  (APRESOLINE ) 25 MG tablet Take 1 tablet (25 mg total) by mouth 2 (two) times daily.   metFORMIN  (GLUCOPHAGE ) 1000 MG tablet Take 1,000 mg by mouth 2 (two) times daily.   Multiple Vitamin (MULTIVITAMIN) tablet Take 1 tablet by mouth every evening.    NOVOLIN 70/30 KWIKPEN (70-30) 100 UNIT/ML KwikPen INJECT 30 UNITS UNDER THE SKIN IN THE MORNING AND 26 UNITS IN THE EVENING   omeprazole  (PRILOSEC) 40 MG capsule Take 40 mg by mouth 2 (two) times daily.   Red Yeast Rice 600 MG CAPS Take 600 mg by mouth every evening.    tamsulosin  (FLOMAX ) 0.4 MG CAPS capsule Take 0.4 mg by mouth 2 (two) times daily.   valsartan -hydrochlorothiazide  (DIOVAN -HCT) 320-25 MG tablet Take 1 tablet by mouth daily.   vitamin B-12 (CYANOCOBALAMIN ) 1000 MCG tablet Take 1,000 mcg by mouth every evening.      Allergies:   Latex, Tramadol, and Codeine   Social History   Socioeconomic History   Marital status: Widowed    Spouse name: Not on file   Number of children: 2   Years of education: Not on file   Highest  education level: Not on file  Occupational History   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attle)  Tobacco Use   Smoking status: Former    Current packs/day: 0.00    Average packs/day: 1 pack/day for 30.0 years (30.0 ttl pk-yrs)    Types: Cigarettes    Start date: 09/22/1976    Quit date: 09/23/2006    Years since quitting: 17.0   Smokeless tobacco: Never  Vaping Use   Vaping status: Never Used  Substance and Sexual Activity   Alcohol use: Not Currently    Comment: occasionally   Drug use: No   Sexual activity: Not Currently    Partners: Female    Comment: widowed  Other Topics Concern   Not on file  Social History Narrative   No health insurance   Social Drivers of Health   Financial Resource Strain: High Risk (05/03/2023)   Received from [facility]   Overall Financial Resource Strain (CARDIA)    Difficulty of Paying Living Expenses: Hard  Food Insecurity: Food Insecurity Present (07/04/2023)   Hunger Vital Sign    Worried About Running Out of Food in the Last Year: Often true    Ran Out of Food in the Last Year: Often true  Transportation Needs: No Transportation Needs (07/04/2023)   PRAPARE - Administrator, Civil Service (Medical): No    Lack of Transportation (Non-Medical): No  Physical Activity: Insufficiently Active (05/03/2023)   Received from [facility]   Exercise Vital Sign    On average, how many days per week do you engage in moderate to strenuous exercise (like a brisk walk)?: 2 days    On average, how many minutes do you engage in exercise at this level?: 20 min  Stress: No Stress Concern Present (05/03/2023)   Received from [facility] - Occupational Stress Questionnaire    Feeling of Stress : Only a little  Social Connections: Socially Isolated (07/04/2023)   Social Connection and Isolation Panel    Frequency of Communication with Friends and Family: More than three times a week    Frequency of Social Gatherings with  Friends and Family: More than three times a week    Attends Religious  Services: Never    Active Member of Clubs or Organizations: No    Attends Banker Meetings: Never    Marital Status: Widowed     Family History: The patient's family history includes Cancer in his mother; Diabetes in his father and mother; Heart attack in his father. There is no history of Colon cancer, Colon polyps, Kidney disease, Gallbladder disease, or Esophageal cancer.  ROS:   Please see the history of present illness.     All other systems reviewed and are negative.  EKGs/Labs/Other Studies Reviewed:    The following studies were reviewed today:   EKG:   07/17/2023: Sinus bradycardia, rate 53, left bundle branch block, PACs  Recent Labs: 07/05/2023: ALT 30 07/08/2023: B Natriuretic Peptide 174.9 07/09/2023: Hemoglobin 12.7; Platelets 176 08/16/2023: BUN 20; Creatinine, Ser 0.82; Magnesium  1.5; Potassium 4.8; Sodium 138  Recent Lipid Panel    Component Value Date/Time   CHOL 132 07/17/2023 1056   TRIG 321 (H) 07/17/2023 1056   HDL 36 (L) 07/17/2023 1056   CHOLHDL 3.7 07/17/2023 1056   CHOLHDL 3.9 04/29/2021 0304   VLDL 34 04/29/2021 0304   LDLCALC 47 07/17/2023 1056   LDLDIRECT 67.7 06/09/2010 1147    Physical Exam:    VS:  BP (!) 150/70   Pulse 64   Ht 5' 8 (1.727 m)   Wt 198 lb 9.6 oz (90.1 kg)   SpO2 96%   BMI 30.20 kg/m     Wt Readings from Last 3 Encounters:  10/21/23 198 lb 9.6 oz (90.1 kg)  07/17/23 190 lb (86.2 kg)  07/04/23 210 lb (95.3 kg)     GEN:  Well nourished, well developed in no acute distress HEENT: Normal NECK: No JVD; No carotid bruits LYMPHATICS: No lymphadenopathy CARDIAC: RRR, no murmurs, rubs, gallops RESPIRATORY:  Clear to auscultation without rales, wheezing or rhonchi  ABDOMEN: Soft, non-tender, non-distended MUSCULOSKELETAL:  No edema; No deformity  SKIN: Warm and dry NEUROLOGIC:  Alert and oriented x 3 PSYCHIATRIC:  Normal affect    ASSESSMENT:    1. Hypertension, unspecified type   2. PAD (peripheral artery disease) (HCC)   3. LBBB (left bundle branch block)   4. Hyperlipidemia, unspecified hyperlipidemia type   5. OSA (obstructive sleep apnea)       PLAN:     PAD: ABIs 12/21/2020 showed moderate disease on right (0.59) and left (0.72).  Lower extremity arterial duplex showed greater than 50% stenosis of right common iliac artery, 75 to 99% stenosis in left common femoral artery.  He was referred to Dr. Court and underwent lower extremity angiography which showed totally occluded right common iliac artery and 99% stenosis of right common femoral artery.  Was referred to vascular surgery. Underwent right common iliac artery stenting and right common femoral endarterectomy with Dr. Gretta 04/28/2021.  Claudication symptoms resolved with intervention on right, Dr. Gretta recommended monitoring left and can consider common femoral endarterectomy if develops symptoms.  ABIs 02/2022 were right 1.21, left 0.9 -Continue aspirin , Plavix  -Continue atorvastatin   LBBB: Echocardiogram 12/28/2020 showed EF 50 to 55%, septal dyskinesis consistent with bundle branch block, grade 1 diastolic dysfunction, normal RV function, no significant valvular disease.   Lexiscan  Myoview  on 03/02/2021 showed normal perfusion (though significant artifact), EF 48%  Hypertension: On amlodipine  10 mg daily, carvedilol  12.5 mg twice daily, valsartan -HCTZ 320 mg - 25 mg daily.  Stopped due to hyperkalemia. - BP remains elevated, will add hydralazine  25 mg twice daily.  Asked to check BP  twice daily and bring log to follow-up appointment in pharmacy hypertension clinic - Suspect untreated OSA contributing to high BP.  Had positive sleep study 20 years ago but has not been on CPAP.  Will check sleep study - Check BMET, magnesium   Hyperlipidemia: On atorvastatin  20 mg 3 times weekly.  LDL 47 on 06/2023  T2DM: On metformin , insulin .  A1c 7.8% on 07/04/2023.   On Jardiance but discontinued after admission with urosepsis 06/2023  Tobacco use: Quit smoking in 2023.  AKI: creatinine up to 3.17 on admission for sepsis on 07/04/23, improved with IV fluids was 1.28 on discharge on 07/09/23.  Most recent BMET with creatinine 0.8 on 08/16/2023  OSA: Positive sleep study in 2005, has not been on CPAP.  Will check Itamar sleep study.  STOP-BANG 4  RTC in 6 months  Medication Adjustments/Labs and Tests Ordered: Current medicines are reviewed at length with the patient today.  Concerns regarding medicines are outlined above.  Orders Placed This Encounter  Procedures   Basic Metabolic Panel (BMET)   Magnesium    AMB Referral to [facility] Pharm-D   Itamar Sleep Study    Meds ordered this encounter  Medications   hydrALAZINE  (APRESOLINE ) 25 MG tablet    Sig: Take 1 tablet (25 mg total) by mouth 2 (two) times daily.    Dispense:  270 tablet    Refill:  3   magnesium  oxide (MAG-OX) 400 (240 Mg) MG tablet    Sig: Take 1 tablet (400 mg total) by mouth 2 (two) times daily.    Dispense:  180 tablet    Refill:  3     Patient Instructions  Medication Instructions:  Continue current medication Start Hydralazine  25 mg twice a day *If you need a refill on your cardiac medications before your next appointment, please call your pharmacy*  Lab Work: Bmet ,mg today If you have labs (blood work) drawn today and your tests are completely normal, you will receive your results only by: MyChart Message (if you have MyChart) OR A paper copy in the mail If you have any lab test that is abnormal or we need to change your treatment, we will call you to review the results.  Testing/Procedures: Itamr WatchPAT?  Is a FDA cleared portable home sleep study test that uses a watch and 3 points of contact to monitor 7 different channels, including your heart rate, oxygen saturations, body position, snoring, and chest motion.  The study is easy to use from the comfort of your  own home and accurately detect sleep apnea.  Before bed, you attach the chest sensor, attached the sleep apnea bracelet to your nondominant hand, and attach the finger probe.  After the study, the raw data is downloaded from the watch and scored for apnea events.   For more information: [email or web address]  Patient Testing Instructions:  Do not put battery into the device until bedtime when you are ready to begin the test. Please call the support number if you need assistance after following the instructions below: 24 hour support line- [phone] or ITAMAR support at [phone] (option 2)  Download the Itamar WatchPAT One app through the google play store or App Store  Be sure to turn on or enable access to bluetooth in settlings on your smartphone/ device  Make sure no other bluetooth devices are on and within the vicinity of your smartphone/ device and WatchPAT watch during testing.  Make sure to leave your smart phone/ device plugged in and charging all night.  When ready for bed:  Follow the instructions step by step in the WatchPAT One App to activate the testing device. For additional instructions, including video instruction, visit the WatchPAT One video on Youtube. You can search for WatchPat One within Youtube (video is 4 minutes and 18 seconds) or enter: https://youtube/watch?[email or web address]_vbiwxE Please note: You will be prompted to enter a Pin to connect via bluetooth when starting the test. The PIN will be assigned to you when you receive the test.  The device is disposable, but it recommended that you retain the device until you receive a call letting you know the study has been received and the results have been interpreted.  We will let you know if the study did not transmit to us  properly after the test is completed. You do not need to call us  to confirm the receipt of the test.  Please complete the test within 48 hours of receiving PIN.   Frequently Asked  Questions:  What is Watch Bruna one?  A single use fully disposable home sleep apnea testing device and will not need to be returned after completion.  What are the requirements to use WatchPAT one?  The be able to have a successful watchpat one sleep study, you should have your Watch pat one device, your smart phone, watch pat one app, your PIN number and Internet access What type of phone do I need?  You should have a smart phone that uses Android 5.1 and above or any Iphone with IOS 10 and above How can I download the WatchPAT one app?  Based on your device type search for WatchPAT one app either in google play for android devices or APP store for Iphone's Where will I get my PIN for the study?  Your PIN will be provided by your physician's office. It is used for authentication and if you lose/forget your PIN, please reach out to your providers office.  I do not have Internet at home. Can I do WatchPAT one study?  WatchPAT One needs Internet connection throughout the night to be able to transmit the sleep data. You can use your home/local internet or your cellular's data package. However, it is always recommended to use home/local Internet. It is estimated that between 20MB-30MB will be used with each study.However, the application will be looking for space in the phone to start the study.  What happens if I lose internet or bluetooth connection?  During the internet disconnection, your phone will not be able to transmit the sleep data. All the data, will be stored in your phone. As soon as the internet connection is back on, the phone will being sending the sleep data. During the bluetooth disconnection, WatchPAT one will not be able to to send the sleep data to your phone. Data will be kept in the WatchPAT one until two devices have bluetooth connection back on. As soon as the connection is back on, WatchPAT one will send the sleep data to the phone.  How long do I need to wear the WatchPAT  one?  After you start the study, you should wear the device at least 6 hours.  How far should I keep my phone from the device?  During the night, your phone should be within 15 feet.  What happens if I leave the room for restroom or other reasons?  Leaving the room for any reason will not cause any problem. As soon as your get back to the room, both devices will reconnect and  will continue to send the sleep data. Can I use my phone during the sleep study?  Yes, you can use your phone as usual during the study. But it is recommended to put your watchpat one on when you are ready to go to bed.  How will I get my study results?  A soon as you completed your study, your sleep data will be sent to the provider. They will then share the results with you when they are ready.     Follow-Up: At [facility], you and your health needs are our priority.  As part of our continuing mission to provide you with exceptional heart care, our providers are all part of one team.  This team includes your primary Cardiologist (physician) and Advanced Practice Providers or APPs (Physician Assistants and Nurse Practitioners) who all work together to provide you with the care you need, when you need it.  Your next appointment:   6 month(s)  Provider:   Lonni LITTIE Nanas, MD    We recommend signing up for the patient portal called MyChart.  Sign up information is provided on this After Visit Summary.  MyChart is used to connect with patients for Virtual Visits (Telemedicine).  Patients are able to view lab/test results, encounter notes, upcoming appointments, etc.  Non-urgent messages can be sent to your provider as well.   To learn more about what you can do with MyChart, go to [email or web address].   Other Instructions    Referral to Phar D  Please check blood pressure twice a day and bring those reading to your Phjarmacy appt   Signed, Lonni LITTIE Nanas, MD  10/21/2023 8:34 AM    [facility]

## 2023-10-21 ENCOUNTER — Encounter: Payer: Self-pay | Admitting: Cardiology

## 2023-10-21 ENCOUNTER — Ambulatory Visit: Attending: Cardiology | Admitting: Cardiology

## 2023-10-21 ENCOUNTER — Telehealth: Payer: Self-pay | Admitting: Radiology

## 2023-10-21 VITALS — BP 150/70 | HR 64 | Ht 68.0 in | Wt 198.6 lb

## 2023-10-21 DIAGNOSIS — E785 Hyperlipidemia, unspecified: Secondary | ICD-10-CM | POA: Diagnosis not present

## 2023-10-21 DIAGNOSIS — I447 Left bundle-branch block, unspecified: Secondary | ICD-10-CM

## 2023-10-21 DIAGNOSIS — I739 Peripheral vascular disease, unspecified: Secondary | ICD-10-CM | POA: Diagnosis not present

## 2023-10-21 DIAGNOSIS — G4733 Obstructive sleep apnea (adult) (pediatric): Secondary | ICD-10-CM

## 2023-10-21 DIAGNOSIS — I10 Essential (primary) hypertension: Secondary | ICD-10-CM | POA: Diagnosis not present

## 2023-10-21 MED ORDER — MAGNESIUM OXIDE -MG SUPPLEMENT 400 (240 MG) MG PO TABS: 1.0000 | ORAL_TABLET | Freq: Two times a day (BID) | ORAL | 3 refills | Status: AC

## 2023-10-21 MED ORDER — HYDRALAZINE HCL 25 MG PO TABS: 25.0000 mg | ORAL_TABLET | Freq: Two times a day (BID) | ORAL | 3 refills | Status: DC

## 2023-10-21 NOTE — Telephone Encounter (Signed)
 Patient agreement reviewed and signed on 10/21/2023.  WatchPAT issued to patient on 10/21/2023 by Woodie LOISE Prudent. Patient aware to not open the WatchPAT box until contacted with the activation PIN. Patient profile initialized in CloudPAT on 10/21/2023 by Rockie RAMAN. Device serial number: [id number]  Please list Reason for Call as Advice Only and type WatchPAT issued to patient in the comment box.

## 2023-10-21 NOTE — Patient Instructions (Addendum)
 Medication Instructions:  Continue current medication Start Hydralazine  25 mg twice a day *If you need a refill on your cardiac medications before your next appointment, please call your pharmacy*  Lab Work: Bmet ,mg today If you have labs (blood work) drawn today and your tests are completely normal, you will receive your results only by: MyChart Message (if you have MyChart) OR A paper copy in the mail If you have any lab test that is abnormal or we need to change your treatment, we will call you to review the results.  Testing/Procedures: [facility] WatchPAT?  Is a FDA cleared portable home sleep study test that uses a watch and 3 points of contact to monitor 7 different channels, including your heart rate, oxygen saturations, body position, snoring, and chest motion.  The study is easy to use from the comfort of your own home and accurately detect sleep apnea.  Before bed, you attach the chest sensor, attached the sleep apnea bracelet to your nondominant hand, and attach the finger probe.  After the study, the raw data is downloaded from the watch and scored for apnea events.   For more information: [email or web address]  Patient Testing Instructions:  Do not put battery into the device until bedtime when you are ready to begin the test. Please call the support number if you need assistance after following the instructions below: 24 hour support line- [phone] or [facility] support at [phone] (option 2)  Download the [employer] WatchPAT One app through the google play store or App Store  Be sure to turn on or enable access to bluetooth in settlings on your smartphone/ device  Make sure no other bluetooth devices are on and within the vicinity of your smartphone/ device and WatchPAT watch during testing.  Make sure to leave your smart phone/ device plugged in and charging all night.  When ready for bed:  Follow the instructions step by step in the WatchPAT One App to  activate the testing device. For additional instructions, including video instruction, visit the WatchPAT One video on Youtube. You can search for WatchPat One within Youtube (video is 4 minutes and 18 seconds) or enter: https://youtube/[email or web address]?[email or web address]_vbiwxE Please note: You will be prompted to enter a Pin to connect via bluetooth when starting the test. The PIN will be assigned to you when you receive the test.  The device is disposable, but it recommended that you retain the device until you receive a call letting you know the study has been received and the results have been interpreted.  We will let you know if the study did not transmit to us  properly after the test is completed. You do not need to call us  to confirm the receipt of the test.  Please complete the test within 48 hours of receiving PIN.   Frequently Asked Questions:  What is Watch Bruna one?  A single use fully disposable home sleep apnea testing device and will not need to be returned after completion.  What are the requirements to use WatchPAT one?  The be able to have a successful watchpat one sleep study, you should have your Watch pat one device, your smart phone, watch pat one app, your PIN number and Internet access What type of phone do I need?  You should have a smart phone that uses Android 5.1 and above or any Iphone with IOS 10 and above How can I download the WatchPAT one app?  Based on your device type search for WatchPAT one app either in google play  for android devices or APP store for Iphone's Where will I get my PIN for the study?  Your PIN will be provided by your physician's office. It is used for authentication and if you lose/forget your PIN, please reach out to your providers office.  I do not have Internet at home. Can I do WatchPAT one study?  WatchPAT One needs Internet connection throughout the night to be able to transmit the sleep data. You can use your home/local internet or your cellular's data package.  However, it is always recommended to use home/local Internet. It is estimated that between 20MB-30MB will be used with each study.However, the application will be looking for space in the phone to start the study.  What happens if I lose internet or bluetooth connection?  During the internet disconnection, your phone will not be able to transmit the sleep data. All the data, will be stored in your phone. As soon as the internet connection is back on, the phone will being sending the sleep data. During the bluetooth disconnection, WatchPAT one will not be able to to send the sleep data to your phone. Data will be kept in the WatchPAT one until two devices have bluetooth connection back on. As soon as the connection is back on, WatchPAT one will send the sleep data to the phone.  How long do I need to wear the WatchPAT one?  After you start the study, you should wear the device at least 6 hours.  How far should I keep my phone from the device?  During the night, your phone should be within 15 feet.  What happens if I leave the room for restroom or other reasons?  Leaving the room for any reason will not cause any problem. As soon as your get back to the room, both devices will reconnect and will continue to send the sleep data. Can I use my phone during the sleep study?  Yes, you can use your phone as usual during the study. But it is recommended to put your watchpat one on when you are ready to go to bed.  How will I get my study results?  A soon as you completed your study, your sleep data will be sent to the provider. They will then share the results with you when they are ready.     Follow-Up: At [facility], you and your health needs are our priority.  As part of our continuing mission to provide you with exceptional heart care, our providers are all part of one team.  This team includes your primary Cardiologist (physician) and Advanced Practice Providers or APPs (Physician  Assistants and Nurse Practitioners) who all work together to provide you with the care you need, when you need it.  Your next appointment:   6 month(s)  Provider:   Lonni LITTIE Nanas, MD    We recommend signing up for the patient portal called MyChart.  Sign up information is provided on this After Visit Summary.  MyChart is used to connect with patients for Virtual Visits (Telemedicine).  Patients are able to view lab/test results, encounter notes, upcoming appointments, etc.  Non-urgent messages can be sent to your provider as well.   To learn more about what you can do with MyChart, go to [email or web address].   Other Instructions    Referral to Phar D  Please check blood pressure twice a day and bring those reading to your Phjarmacy appt

## 2023-10-22 ENCOUNTER — Ambulatory Visit: Payer: Self-pay | Admitting: Cardiology

## 2023-10-22 DIAGNOSIS — I4891 Unspecified atrial fibrillation: Secondary | ICD-10-CM

## 2023-10-22 LAB — BASIC METABOLIC PANEL WITH GFR
BUN/Creatinine Ratio: 24 (ref 10–24)
BUN: 27 mg/dL (ref 8–27)
CO2: 22 mmol/L (ref 20–29)
Calcium: 9.6 mg/dL (ref 8.6–10.2)
Chloride: 101 mmol/L (ref 96–106)
Creatinine, Ser: 1.14 mg/dL (ref 0.76–1.27)
Glucose: 145 mg/dL — ABNORMAL HIGH (ref 70–99)
Potassium: 5.2 mmol/L (ref 3.5–5.2)
Sodium: 139 mmol/L (ref 134–144)
eGFR: 67 mL/min/{1.73_m2} (ref >59)

## 2023-10-22 LAB — MAGNESIUM: Magnesium: 1.7 mg/dL (ref 1.6–2.3)

## 2023-11-28 ENCOUNTER — Encounter: Payer: Self-pay | Admitting: Pharmacist

## 2023-11-28 ENCOUNTER — Ambulatory Visit: Attending: Cardiology | Admitting: Pharmacist

## 2023-11-28 VITALS — BP 138/62 | HR 65

## 2023-11-28 DIAGNOSIS — I10 Essential (primary) hypertension: Secondary | ICD-10-CM

## 2023-11-28 MED ORDER — HYDRALAZINE HCL 50 MG PO TABS: 50.0000 mg | ORAL_TABLET | Freq: Two times a day (BID) | ORAL | 1 refills | Status: DC

## 2023-11-28 NOTE — Progress Notes (Signed)
 Patient ID: Todd Krause                 DOB: [birth year]                      MRN: [id number]     HPI: Todd Krause is a 73 y.o. male referred by Dr. Kate to HTN clinic. PMH is significant for HTN, PAD, LBBB, T2DM and HLD.   Patient seen by Dr Kate on 10/21/23. BP was 150/70 in room and hydralazine  25mg  BID was added. Previously on spironolactone  which was discontinued due to hyperkalemia.  Presents today in good spirits. Patient reports he feels well and blood pressure is slowly lowering.  Home readings: 7/31: 135/74 7/30: 133/68, 134/69 7/29: 126/65, 142/72 7/28: 135/70, 147/69  Does not add salt to food. Denies alcohol or tobacco use. Has 2 Chihuahuas at home which he walks daily.  No adverse effects to hydralazine  or other anti-hypertensives.   Brought in watch-pat sleep device because he did not know how to use it.  Current HTN meds:  Amlodipine  10mg  daily Carvedilol  12.5mg  daily Valsartan  - hydrochlorothiazide  320-25mg  daily Hydralazine  25mg  BID  Previously tried: Spironolactone  (hyperkalemia)  BP goal: <130/80   Wt Readings from Last 3 Encounters:  10/21/23 198 lb 9.6 oz (90.1 kg)  07/17/23 190 lb (86.2 kg)  07/04/23 210 lb (95.3 kg)   BP Readings from Last 3 Encounters:  10/21/23 (!) 150/70  07/17/23 122/60  07/09/23 (!) 139/55   Pulse Readings from Last 3 Encounters:  10/21/23 64  07/17/23 (!) 53  07/09/23 75    Renal function: CrCl cannot be calculated (Patient's most recent lab result is older than the maximum 21 days allowed.).  Past Medical History:  Diagnosis Date   Arthritis    hands, knees   BPH (benign prostatic hypertrophy)    Chronic bronchitis (HCC)    Coronary artery disease    ED (erectile dysfunction)    Elevated PSA    GERD (gastroesophageal reflux disease)    Hepatitis    when in High School in the 60's. Does not know which one it was. No current issues   History of colon polyps    History of hiatal  hernia    Hyperlipidemia    Hypertension    Left bundle branch block (LBBB)    Lower urinary tract symptoms (LUTS)    OSA (obstructive sleep apnea)    mild to moderate osa per study 02-18-2004--  no cpap   Peripheral vascular disease (HCC)    Prostate nodule    Type 2 diabetes mellitus (HCC)     Current Outpatient Medications on File Prior to Visit  Medication Sig Dispense Refill   ACCU-CHEK GUIDE TEST test strip SMARTSIG:Strip(s)     acetaminophen  (TYLENOL ) 325 MG tablet Take 2 tablets (650 mg total) by mouth every 6 (six) hours as needed for mild pain (pain score 1-3) (or Fever >/= 101).     albuterol  (PROVENTIL ) (2.5 MG/3ML) 0.083% nebulizer solution Take 2.5 mg by nebulization every 6 (six) hours as needed for wheezing or shortness of breath.     albuterol  (VENTOLIN  HFA) 108 (90 Base) MCG/ACT inhaler Inhale 2 puffs into the lungs every 4 (four) hours as needed for shortness of breath or wheezing.     amLODipine  (NORVASC ) 10 MG tablet Take 10 mg by mouth at bedtime.     aspirin  EC 81 MG EC tablet Take 1 tablet (81 mg total) by mouth daily  at 6 (six) AM. Swallow whole.     atorvastatin  (LIPITOR ) 20 MG tablet Take 20 mg by mouth 3 (three) times a week. In the evening     calcium -vitamin D  (OSCAL WITH D) 500-5 MG-MCG tablet Take 1 tablet by mouth 3 (three) times daily.     carvedilol  (COREG ) 12.5 MG tablet Take 1 tablet (12.5 mg total) by mouth 2 (two) times daily with a meal. 180 tablet 3   celecoxib (CELEBREX) 200 MG capsule Take 200 mg by mouth 2 (two) times daily.     Cholecalciferol  (VITAMIN D3) 2000 UNITS TABS Take 2,000 Units by mouth every evening.     clopidogrel  (PLAVIX ) 75 MG tablet TAKE 1 TABLET EVERY DAY  AT  6AM 90 tablet 10   fenofibrate  54 MG tablet Take 54 mg by mouth daily.     finasteride  (PROSCAR ) 5 MG tablet Take 1 tablet (5 mg total) by mouth daily. 30 tablet 1   HUMALOG MIX 75/25 KWIKPEN (75-25) 100 UNIT/ML KwikPen Inject 15-20 Units into the skin See admin  instructions. Inject 20 units into the skin in the morning and 15 units in the evening.     hydrALAZINE  (APRESOLINE ) 25 MG tablet Take 1 tablet (25 mg total) by mouth 2 (two) times daily. 270 tablet 3   magnesium  oxide (MAG-OX) 400 (240 Mg) MG tablet Take 1 tablet (400 mg total) by mouth 2 (two) times daily. 180 tablet 3   metFORMIN  (GLUCOPHAGE ) 1000 MG tablet Take 1,000 mg by mouth 2 (two) times daily.     Multiple Vitamin (MULTIVITAMIN) tablet Take 1 tablet by mouth every evening.      NOVOLIN 70/30 KWIKPEN (70-30) 100 UNIT/ML KwikPen INJECT 30 UNITS UNDER THE SKIN IN THE MORNING AND 26 UNITS IN THE EVENING     omeprazole  (PRILOSEC) 40 MG capsule Take 40 mg by mouth 2 (two) times daily.     Red Yeast Rice 600 MG CAPS Take 600 mg by mouth every evening.      tamsulosin  (FLOMAX ) 0.4 MG CAPS capsule Take 0.4 mg by mouth 2 (two) times daily.     valsartan -hydrochlorothiazide  (DIOVAN -HCT) 320-25 MG tablet Take 1 tablet by mouth daily.     vitamin B-12 (CYANOCOBALAMIN ) 1000 MCG tablet Take 1,000 mcg by mouth every evening.      No current facility-administered medications on file prior to visit.    Allergies  Allergen Reactions   Latex Hives   Tramadol Rash   Codeine Other (See Comments)    constipation     Assessment/Plan:  1. Hypertension -    Patient BP in room 138/62 which is improved but still above goal of <130/80. Since patient tolerating hydralazine , will increase to 50mg  BID at this time. Patient aware he can take two of his 25mg  tablets until new Rx is sent in mail. Recommended to continue monitoring BP at home and will recheck in clinic in 6 weeks.  Brought sleep coordinator into exam room to teach patient how to apply and register watch pat device. Patient voiced understanding.  Continue: Amlodipine  10mg  daily Carvedilol  12.5mg  daily Valsartan  - hydrochlorothiazide  320-25mg  daily Increase hydralazine  to 50mg  BID Recheck in 6 weeks  Medford Bolk, PharmD, BCACP, CDCES,  CPP [facility] [address], [place], [place] [address] Phone: [phone]; Fax: [phone] 11/28/2023 9:12 AM

## 2023-11-28 NOTE — Patient Instructions (Addendum)
 It was nice meeting you today  We would like your blood pressure to be less than 130/80  Please continue your: Amlodipine  10mg  daily Carvedilol  12.5mg  twice a day Valsartan /hydrochlorothiazide  320/25mg  once a day  We will increase your hydralazine  to 50mg  twice daily. You can take 2 of your 25mg  tablets until you run out  I will see you back in a month  Medford Bolk, PharmD, BCACP, CDCES, CPP [facility] [address], [place], [place] [address] Phone: [phone]; Fax: [phone] 11/28/2023 8:47 AM

## 2023-12-17 ENCOUNTER — Encounter (INDEPENDENT_AMBULATORY_CARE_PROVIDER_SITE_OTHER): Payer: Self-pay | Admitting: Cardiology

## 2023-12-17 DIAGNOSIS — G4733 Obstructive sleep apnea (adult) (pediatric): Secondary | ICD-10-CM | POA: Diagnosis not present

## 2023-12-22 NOTE — Procedures (Signed)
   SLEEP STUDY REPORT Patient Information Study Date: 12/17/2023 Patient Name: Todd Krause Patient ID: [id number] Birth Date: 1951-01-16 Age: 73 Gender: Male BMI: 30.1 (W=198 lb, H=5' 8'') Stopbang: 4 Referring Physician: Lonni Nanas, MD  TEST DESCRIPTION: Home sleep apnea testing was completed using the WatchPat, a Type 1 device, utilizing peripheral arterial tonometry (PAT), chest movement, actigraphy, pulse oximetry, pulse rate, body position and snore. AHI was calculated with apnea and hypopnea using valid sleep time as the denominator. RDI includes apneas, hypopneas, and RERAs. The data acquired and the scoring of sleep and all associated events were performed in accordance with the recommended standards and specifications as outlined in the AASM Manual for the Scoring of Sleep and Associated Events 2.2.0 (2015).  FINDINGS:  1. Mild Obstructive Sleep Apnea with AHI 5.9/hr overall and moderate during REM sleep with REM AHI 25.4/hr.  2. No Central Sleep Apnea with pAHIc 0/hr.  3. Oxygen desaturations as low as 78%.  4. Moderate snoring was present. O2 sats were < 88% for 18.8 min.  5. Total sleep time was 4 hrs and 56 min.  6. 11.8% of total sleep time was spent in REM sleep.  7. Prolonged sleep onset latency at 39 min.  8. Prolonged REM sleep onset latency at 124 min.  9. Total awakenings were 4. 10. Arrhythmia detection: Suggestive of possible brief atrial fibrillation lasting 3 minutes and 45 seconds. This is not diagnostic and further testing with outpatient telemetry monitoring is recommended.  DIAGNOSIS: Mild Obstructive Sleep Apnea (G47.33) Overall Moderate Obstructive Sleep Apnea during REM sleep Nocturnal Hypoxemia  RECOMMENDATIONS: 1. Clinical correlation of these findings is necessary. The decision to treat obstructive sleep apnea (OSA) is usually based on the presence of apnea symptoms or the presence of associated medical conditions such as  Hypertension, Congestive Heart Failure, Atrial Fibrillation or Obesity. The most common symptoms of OSA are snoring, gasping for breath while sleeping, daytime sleepiness and fatigue. 2. Initiating apnea therapy is recommended given the presence of symptoms and/or associated conditions. Recommend proceeding with one of the following:  a. Auto-CPAP therapy with a pressure range of 5-20cm H2O.  b. An oral appliance (OA) that can be obtained from certain dentists with expertise in sleep medicine. These are primarily of use in non-obese patients with mild and moderate disease.  c. An ENT consultation which may be useful to look for specific causes of obstruction and possible treatment options.  d. If patient is intolerant to PAP therapy, consider referral to ENT for evaluation for hypoglossal nerve stimulator. 3. Close follow-up is necessary to ensure success with CPAP or oral appliance therapy for maximum benefit . 4. A follow-up oximetry study on CPAP is recommended to assess the adequacy of therapy and determine the need for supplemental oxygen or the potential need for Bi-level therapy. An arterial blood gas to determine the adequacy of baseline ventilation and oxygenation should also be considered. 5. Healthy sleep recommendations include: adequate nightly sleep (normal 7-9 hrs/night), avoidance of caffeine after noon and alcohol near bedtime, and maintaining a sleep environment that is cool, dark and quiet. 6. Weight loss for overweight patients is recommended. Even modest amounts of weight loss can significantly improve the severity of sleep apnea. 7. Snoring recommendations include: weight loss where appropriate, side sleeping, and avoidance of alcohol before bed. 8. Operation of motor vehicle should be avoided when sleepy.  Signature: Wilbert Bihari, MD; [facility]; Diplomat, American Board of Sleep Medicine Electronically Signed: 12/22/2023 9:48:23 PM

## 2023-12-23 ENCOUNTER — Ambulatory Visit: Attending: Cardiology

## 2023-12-23 DIAGNOSIS — G4733 Obstructive sleep apnea (adult) (pediatric): Secondary | ICD-10-CM

## 2024-01-09 ENCOUNTER — Encounter: Payer: Self-pay | Admitting: Pharmacist

## 2024-01-09 ENCOUNTER — Ambulatory Visit: Attending: Cardiology | Admitting: Pharmacist

## 2024-01-09 VITALS — BP 156/70 | HR 61

## 2024-01-09 DIAGNOSIS — I10 Essential (primary) hypertension: Secondary | ICD-10-CM | POA: Diagnosis not present

## 2024-01-09 NOTE — Progress Notes (Signed)
 Patient ID: MAHKI SPIKES                 DOB: [birth year]                      MRN: [id number]     HPI: Todd Krause is a 73 y.o. male referred by Dr. Kate to HTN clinic. PMH is significant for HTN, PAD, LBBB, T2DM and HLD.   Patient seen by Dr Kate on 10/21/23. BP was 150/70 in room and hydralazine  25mg  BID was added. Previously on spironolactone  which was discontinued due to hyperkalemia.  Saw patient on 11/28/23. Patient felt well but home readings were still elevated. Hydralazine  was increased to 50mg  BID.  Patient presents today for follow up. Upset that he has a copay for the visit today. Angry while rooming and sitting down for vitals check.  Brought home logs. BP has improved and he is pleased with the results:  8/31: 138/58, 135/66 8/30: 127/67, 123/64 8/29: 133/67, 140/73 8/28: 137/72, 143/74 8/27: 130/69, 145/75 8/26: 127/63, 147/73   Does not add salt to food. Denies alcohol or tobacco use. Has 2 Chihuahuas at home which he walks daily.  Returned sleep-pat device but has not received a call.  Current HTN meds:  Amlodipine  10mg  daily Carvedilol  12.5mg  daily Valsartan  - hydrochlorothiazide  320-25mg  daily Hydralazine  50mg  BID   Previously tried: Spironolactone  (hyperkalemia)  BP goal: <130/80   Wt Readings from Last 3 Encounters:  10/21/23 198 lb 9.6 oz (90.1 kg)  07/17/23 190 lb (86.2 kg)  07/04/23 210 lb (95.3 kg)   BP Readings from Last 3 Encounters:  10/21/23 (!) 150/70  07/17/23 122/60  07/09/23 (!) 139/55   Pulse Readings from Last 3 Encounters:  10/21/23 64  07/17/23 (!) 53  07/09/23 75    Renal function: CrCl cannot be calculated (Patient's most recent lab result is older than the maximum 21 days allowed.).  Past Medical History:  Diagnosis Date   Arthritis    hands, knees   BPH (benign prostatic hypertrophy)    Chronic bronchitis (HCC)    Coronary artery disease    ED (erectile dysfunction)    Elevated PSA     GERD (gastroesophageal reflux disease)    Hepatitis    when in High School in the 60's. Does not know which one it was. No current issues   History of colon polyps    History of hiatal hernia    Hyperlipidemia    Hypertension    Left bundle branch block (LBBB)    Lower urinary tract symptoms (LUTS)    OSA (obstructive sleep apnea)    mild to moderate osa per study 02-18-2004--  no cpap   Peripheral vascular disease (HCC)    Prostate nodule    Type 2 diabetes mellitus (HCC)     Current Outpatient Medications on File Prior to Visit  Medication Sig Dispense Refill   ACCU-CHEK GUIDE TEST test strip SMARTSIG:Strip(s)     acetaminophen  (TYLENOL ) 325 MG tablet Take 2 tablets (650 mg total) by mouth every 6 (six) hours as needed for mild pain (pain score 1-3) (or Fever >/= 101).     albuterol  (PROVENTIL ) (2.5 MG/3ML) 0.083% nebulizer solution Take 2.5 mg by nebulization every 6 (six) hours as needed for wheezing or shortness of breath.     albuterol  (VENTOLIN  HFA) 108 (90 Base) MCG/ACT inhaler Inhale 2 puffs into the lungs every 4 (four) hours as needed for shortness of breath or wheezing.  amLODipine  (NORVASC ) 10 MG tablet Take 10 mg by mouth at bedtime.     aspirin  EC 81 MG EC tablet Take 1 tablet (81 mg total) by mouth daily at 6 (six) AM. Swallow whole.     atorvastatin  (LIPITOR ) 20 MG tablet Take 20 mg by mouth 3 (three) times a week. In the evening     calcium -vitamin D  (OSCAL WITH D) 500-5 MG-MCG tablet Take 1 tablet by mouth 3 (three) times daily.     carvedilol  (COREG ) 12.5 MG tablet Take 1 tablet (12.5 mg total) by mouth 2 (two) times daily with a meal. 180 tablet 3   celecoxib (CELEBREX) 200 MG capsule Take 200 mg by mouth 2 (two) times daily.     Cholecalciferol  (VITAMIN D3) 2000 UNITS TABS Take 2,000 Units by mouth every evening.     clopidogrel  (PLAVIX ) 75 MG tablet TAKE 1 TABLET EVERY DAY  AT  6AM 90 tablet 10   fenofibrate  54 MG tablet Take 54 mg by mouth daily.      finasteride  (PROSCAR ) 5 MG tablet Take 1 tablet (5 mg total) by mouth daily. 30 tablet 1   HUMALOG MIX 75/25 KWIKPEN (75-25) 100 UNIT/ML KwikPen Inject 15-20 Units into the skin See admin instructions. Inject 20 units into the skin in the morning and 15 units in the evening.     hydrALAZINE  (APRESOLINE ) 25 MG tablet Take 1 tablet (25 mg total) by mouth 2 (two) times daily. 270 tablet 3   magnesium  oxide (MAG-OX) 400 (240 Mg) MG tablet Take 1 tablet (400 mg total) by mouth 2 (two) times daily. 180 tablet 3   metFORMIN  (GLUCOPHAGE ) 1000 MG tablet Take 1,000 mg by mouth 2 (two) times daily.     Multiple Vitamin (MULTIVITAMIN) tablet Take 1 tablet by mouth every evening.      NOVOLIN 70/30 KWIKPEN (70-30) 100 UNIT/ML KwikPen INJECT 30 UNITS UNDER THE SKIN IN THE MORNING AND 26 UNITS IN THE EVENING     omeprazole  (PRILOSEC) 40 MG capsule Take 40 mg by mouth 2 (two) times daily.     Red Yeast Rice 600 MG CAPS Take 600 mg by mouth every evening.      tamsulosin  (FLOMAX ) 0.4 MG CAPS capsule Take 0.4 mg by mouth 2 (two) times daily.     valsartan -hydrochlorothiazide  (DIOVAN -HCT) 320-25 MG tablet Take 1 tablet by mouth daily.     vitamin B-12 (CYANOCOBALAMIN ) 1000 MCG tablet Take 1,000 mcg by mouth every evening.      No current facility-administered medications on file prior to visit.    Allergies  Allergen Reactions   Latex Hives   Tramadol Rash   Codeine Other (See Comments)    constipation     Assessment/Plan:  1. Hypertension -   HYPERTENSION CONTROL Vitals:   01/09/24 0921 01/09/24 0922  BP: (!) 160/69 (!) 156/70    The patient's blood pressure is elevated above target today.  In order to address the patient's elevated BP: The blood pressure is usually elevated in clinic.  Blood pressures monitored at home have been optimal.    Patient BP in room elevated today however he is upset regarding having to pay a copay. Home readings have been better controlled including some at goal of  <130/80. Patient feels well and is pleased with home readings. No med changes needed at this time. Advised for patient to call clinic with any concerns and will conduct follow up over phone as needed. Patient voiced understanding.  Continue: Amlodipine  10mg  daily  Carvedilol  12.5mg  daily Valsartan  - hydrochlorothiazide  320-25mg  daily Hydralazine  50mg  BID F/u as needed  Medford Bolk, PharmD, BCACP, CDCES, CPP [facility] [address] [place], [place] [address] Phone: [phone]; Fax: [phone] 01/09/2024 9:57 AM          Continue: Amlodipine  10mg  daily Carvedilol  12.5mg  daily Valsartan  - hydrochlorothiazide  320-25mg  daily Increase hydralazine  to 50mg  BID Recheck in 6 weeks  Medford Bolk, PharmD, BCACP, CDCES, CPP [facility] [address], [place], [place] [address] Phone: [phone]; Fax: [phone] 11/28/2023 9:12 AM

## 2024-01-09 NOTE — Patient Instructions (Addendum)
 It was good seeing you again  We would like your blood pressure to be less than 130/80  It is a little high in the room but your home readings look much better  Please continue your: Amlodipine  10mg  daily Carvedilol  12.5mg  daily Valsartan  - hydrochlorothiazide  320-25mg  daily Hydralazine  50mg  twice a day  I will reach out to the sleep team regarding your sleep study  Please continue to check your blood pressure at home and give us  a call with any concerns  Medford Bolk, PharmD, BCACP, CDCES, CPP [facility] [address] [place], [place] [address] Phone: [phone]; Fax: [phone] 01/09/2024 9:27 AM

## 2024-01-10 ENCOUNTER — Other Ambulatory Visit (HOSPITAL_BASED_OUTPATIENT_CLINIC_OR_DEPARTMENT_OTHER): Payer: Self-pay | Admitting: *Deleted

## 2024-01-10 ENCOUNTER — Ambulatory Visit: Attending: Cardiology

## 2024-01-10 DIAGNOSIS — I4891 Unspecified atrial fibrillation: Secondary | ICD-10-CM

## 2024-01-10 NOTE — Progress Notes (Unsigned)
 Enrolled for [employer] to mail a ZIO XT long term holter monitor to the patients address on file.

## 2024-01-10 NOTE — Addendum Note (Signed)
 Addended by: TRUDY FRANKEY SAUNDERS on: 01/10/2024 10:52 AM   Modules accepted: Orders

## 2024-01-15 ENCOUNTER — Other Ambulatory Visit: Payer: Self-pay | Admitting: *Deleted

## 2024-01-15 ENCOUNTER — Telehealth: Payer: Self-pay | Admitting: Cardiology

## 2024-01-15 NOTE — Telephone Encounter (Signed)
 Called patient and made patient aware that per Dr. Kate on 9/12 possible a fib seen on sleep study and Dr. Kate recommends wearing ZIO x 14 days. Made patient aware to call office for any other questions and instruction sent to patient with ZIO monitor instructions. Pt verbalized an understanding

## 2024-01-15 NOTE — Telephone Encounter (Signed)
 Spoke with pt. Pt doesn't know why the Zio monitor was ordered nor did he know it was ordered until it came to his house. Was unable to find a documented trail as to why it was ordered other than Afib listed on the actual order. Advised pt I would forward to Dr Alvan nurse and we would reach back out to him.

## 2024-01-15 NOTE — Telephone Encounter (Signed)
 Patient calling in about a monitor he received in the mail. Please advise

## 2024-01-16 ENCOUNTER — Telehealth: Payer: Self-pay | Admitting: *Deleted

## 2024-01-16 NOTE — Telephone Encounter (Signed)
-----   Message from Wilbert Bihari sent at 12/22/2023  9:49 PM EDT ----- Please let patient know that they have sleep apnea.  Recommend therapeutic CPAP titration for treatment of patient's sleep disordered breathing.

## 2024-01-16 NOTE — Telephone Encounter (Signed)
 Returned Call:  Patient called back to say he does not think he will be able to afford the cpap titration. He will call his insurance carrier to ask about the cost and call back.

## 2024-01-16 NOTE — Telephone Encounter (Signed)
The patient has been notified of the result. Left detailed message on voicemail and informed patient to call back.

## 2024-03-22 ENCOUNTER — Ambulatory Visit: Payer: Self-pay | Admitting: Cardiology

## 2024-03-22 DIAGNOSIS — I4891 Unspecified atrial fibrillation: Secondary | ICD-10-CM | POA: Diagnosis not present

## 2024-04-22 ENCOUNTER — Other Ambulatory Visit: Payer: Self-pay | Admitting: Cardiology

## 2024-05-28 ENCOUNTER — Other Ambulatory Visit: Payer: Self-pay | Admitting: Cardiology
